# Patient Record
Sex: Male | Born: 1950 | Race: White | Hispanic: No | Marital: Married | Smoking: Former smoker
Health system: Southern US, Community
[De-identification: ages and names within clinical notes are randomized; demographics above are authoritative.]

## PROBLEM LIST (undated history)

## (undated) DIAGNOSIS — E785 Hyperlipidemia, unspecified: Secondary | ICD-10-CM

## (undated) DIAGNOSIS — T8859XA Other complications of anesthesia, initial encounter: Secondary | ICD-10-CM

## (undated) DIAGNOSIS — I10 Essential (primary) hypertension: Secondary | ICD-10-CM

## (undated) DIAGNOSIS — E079 Disorder of thyroid, unspecified: Secondary | ICD-10-CM

## (undated) DIAGNOSIS — Z8719 Personal history of other diseases of the digestive system: Secondary | ICD-10-CM

## (undated) DIAGNOSIS — E049 Nontoxic goiter, unspecified: Secondary | ICD-10-CM

## (undated) DIAGNOSIS — T4145XA Adverse effect of unspecified anesthetic, initial encounter: Secondary | ICD-10-CM

## (undated) DIAGNOSIS — K5790 Diverticulosis of intestine, part unspecified, without perforation or abscess without bleeding: Secondary | ICD-10-CM

## (undated) DIAGNOSIS — N200 Calculus of kidney: Secondary | ICD-10-CM

## (undated) DIAGNOSIS — I499 Cardiac arrhythmia, unspecified: Secondary | ICD-10-CM

## (undated) DIAGNOSIS — M1990 Unspecified osteoarthritis, unspecified site: Secondary | ICD-10-CM

## (undated) DIAGNOSIS — E119 Type 2 diabetes mellitus without complications: Secondary | ICD-10-CM

## (undated) DIAGNOSIS — M8430XA Stress fracture, unspecified site, initial encounter for fracture: Secondary | ICD-10-CM

## (undated) HISTORY — DX: Essential (primary) hypertension: I10

## (undated) HISTORY — PX: TONSILLECTOMY: SUR1361

## (undated) HISTORY — PX: CYSTOSCOPY WITH INSERTION OF UROLIFT: SHX6678

## (undated) HISTORY — DX: Type 2 diabetes mellitus without complications: E11.9

## (undated) HISTORY — PX: JOINT REPLACEMENT: SHX530

## (undated) HISTORY — DX: Hyperlipidemia, unspecified: E78.5

## (undated) HISTORY — PX: WISDOM TOOTH EXTRACTION: SHX21

## (undated) HISTORY — DX: Disorder of thyroid, unspecified: E07.9

## (undated) HISTORY — DX: Calculus of kidney: N20.0

## (undated) HISTORY — DX: Diverticulosis of intestine, part unspecified, without perforation or abscess without bleeding: K57.90

## (undated) HISTORY — PX: SKIN CANCER DESTRUCTION: SHX778

## (undated) HISTORY — DX: Stress fracture, unspecified site, initial encounter for fracture: M84.30XA

---

## 1998-11-18 ENCOUNTER — Encounter: Admission: RE | Admit: 1998-11-18 | Discharge: 1999-02-16 | Payer: Self-pay | Admitting: *Deleted

## 2001-10-13 ENCOUNTER — Ambulatory Visit (HOSPITAL_COMMUNITY): Admission: RE | Admit: 2001-10-13 | Discharge: 2001-10-13 | Payer: Self-pay | Admitting: Family Medicine

## 2001-10-13 ENCOUNTER — Encounter: Payer: Self-pay | Admitting: Family Medicine

## 2001-10-31 ENCOUNTER — Encounter: Admission: RE | Admit: 2001-10-31 | Discharge: 2001-12-16 | Payer: Self-pay | Admitting: Family Medicine

## 2004-09-28 HISTORY — PX: OTHER SURGICAL HISTORY: SHX169

## 2005-09-11 ENCOUNTER — Emergency Department (HOSPITAL_COMMUNITY): Admission: EM | Admit: 2005-09-11 | Discharge: 2005-09-12 | Payer: Self-pay | Admitting: Emergency Medicine

## 2005-09-13 ENCOUNTER — Ambulatory Visit (HOSPITAL_COMMUNITY): Admission: EM | Admit: 2005-09-13 | Discharge: 2005-09-13 | Payer: Self-pay | Admitting: Emergency Medicine

## 2005-09-13 ENCOUNTER — Ambulatory Visit: Payer: Self-pay | Admitting: Gastroenterology

## 2008-04-13 ENCOUNTER — Ambulatory Visit: Payer: Self-pay | Admitting: Cardiology

## 2008-04-16 ENCOUNTER — Ambulatory Visit: Payer: Self-pay

## 2008-04-16 ENCOUNTER — Encounter: Payer: Self-pay | Admitting: Cardiology

## 2008-05-02 ENCOUNTER — Encounter: Admission: RE | Admit: 2008-05-02 | Discharge: 2008-05-02 | Payer: Self-pay | Admitting: Family Medicine

## 2008-05-10 ENCOUNTER — Other Ambulatory Visit: Admission: RE | Admit: 2008-05-10 | Discharge: 2008-05-10 | Payer: Self-pay | Admitting: Diagnostic Radiology

## 2008-05-10 ENCOUNTER — Encounter (INDEPENDENT_AMBULATORY_CARE_PROVIDER_SITE_OTHER): Payer: Self-pay | Admitting: Diagnostic Radiology

## 2008-05-10 ENCOUNTER — Encounter: Admission: RE | Admit: 2008-05-10 | Discharge: 2008-05-10 | Payer: Self-pay | Admitting: Family Medicine

## 2009-03-06 ENCOUNTER — Encounter: Admission: RE | Admit: 2009-03-06 | Discharge: 2009-03-06 | Payer: Self-pay | Admitting: General Surgery

## 2009-08-20 ENCOUNTER — Encounter (INDEPENDENT_AMBULATORY_CARE_PROVIDER_SITE_OTHER): Payer: Self-pay | Admitting: *Deleted

## 2009-09-09 ENCOUNTER — Encounter (INDEPENDENT_AMBULATORY_CARE_PROVIDER_SITE_OTHER): Payer: Self-pay | Admitting: *Deleted

## 2009-09-10 ENCOUNTER — Encounter (INDEPENDENT_AMBULATORY_CARE_PROVIDER_SITE_OTHER): Payer: Self-pay | Admitting: *Deleted

## 2009-09-10 ENCOUNTER — Ambulatory Visit: Payer: Self-pay | Admitting: Gastroenterology

## 2009-09-12 ENCOUNTER — Telehealth: Payer: Self-pay | Admitting: Gastroenterology

## 2009-10-07 ENCOUNTER — Ambulatory Visit: Payer: Self-pay | Admitting: Gastroenterology

## 2010-05-05 ENCOUNTER — Encounter: Admission: RE | Admit: 2010-05-05 | Discharge: 2010-05-05 | Payer: Self-pay | Admitting: Family Medicine

## 2010-06-16 ENCOUNTER — Encounter: Admission: RE | Admit: 2010-06-16 | Discharge: 2010-06-16 | Payer: Self-pay | Admitting: Family Medicine

## 2010-10-28 NOTE — Procedures (Signed)
Summary: Colonoscopy  Patient: [name] Note: All result statuses are Final unless otherwise noted.  Tests: (1) Colonoscopy (COL)   COL Colonoscopy           DONE     [facility]     [address]     [place], [place]  [address]           COLONOSCOPY PROCEDURE REPORT           PATIENT:  Austin Graham, Austin Graham  MR#:  [id number]     BIRTHDATE:  21-Nov-1950, 58 yrs. old  GENDER:  male           ENDOSCOPIST:  Vania Rea. Jarold Motto, MD, [facility]     Referred by:           PROCEDURE DATE:  10/07/2009     PROCEDURE:  Colonoscopy, Diagnostic     ASA CLASS:  Class II     INDICATIONS:  family Hx of polyps           MEDICATIONS:   Fentanyl 75 mcg IV, Versed 7 mg           DESCRIPTION OF PROCEDURE:   After the risks benefits and     alternatives of the procedure were thoroughly explained, informed     consent was obtained.  Digital rectal exam was performed and     revealed no abnormalities.   The LB CF-[id number] endoscope     was introduced through the anus and advanced to the cecum, which     was identified by both the appendix and ileocecal valve, limited     by extreme patient discomfort, a redundant colon.    The quality     of the prep was excellent, using MoviPrep.  The instrument was     then slowly withdrawn as the colon was fully examined.     <<PROCEDUREIMAGES>>           FINDINGS:  Scattered diverticula were found.  No polyps or cancers     were seen.  This was otherwise a normal examination of the colon.     Retroflexed views in the rectum revealed no abnormalities.    The     scope was then withdrawn from the patient and the procedure     completed.           COMPLICATIONS:  None           ENDOSCOPIC IMPRESSION:     1) Diverticula, scattered     2) No polyps or cancers     3) Otherwise normal examination     RECOMMENDATIONS:     1) Continue current colorectal screening recommendations for     "routine risk" patients with a repeat colonoscopy in 10  years.     2) high fiber diet           REPEAT EXAM:  No           ______________________________     Vania Rea. Jarold Motto, MD, [facility]           CC:  Rudi Heap, MD           n.     Rosalie Doctor:   Vania Rea. Gunda Maqueda at 10/07/2009 08:58 AM           Hinchliffe, Maisie Fus, [id number]  Note: An exclamation mark (!) indicates a result that was not dispersed into the flowsheet. Document Creation Date: 10/07/2009 8:58 AM _______________________________________________________________________  (1) Order result status:  Final Collection or observation date-time: 10/07/2009 08:51 Requested date-time:  Receipt date-time:  Reported date-time:  Referring Physician:   Ordering Physician: Sheryn Bison [phone]) Specimen Source:  Source: Launa Grill Order Number: [id number] Lab site:   Appended Document: Colonoscopy    Clinical Lists Changes  Observations: Added new observation of COLONNXTDUE: 09/2019 (10/07/2009 12:13)      Appended Document: Colonoscopy     Procedures Next Due Date:    Colonoscopy: 09/2019

## 2010-12-14 LAB — GLUCOSE, CAPILLARY
Glucose-Capillary: 120 mg/dL — ABNORMAL HIGH (ref 70–99)
Glucose-Capillary: 136 mg/dL — ABNORMAL HIGH (ref 70–99)

## 2011-02-06 ENCOUNTER — Encounter (INDEPENDENT_AMBULATORY_CARE_PROVIDER_SITE_OTHER): Payer: Self-pay | Admitting: General Surgery

## 2011-02-10 NOTE — Assessment & Plan Note (Signed)
[facility]                            CARDIOLOGY OFFICE NOTE   Austin Graham, Austin Graham                     MRN:          [id number]  DATE:04/13/2008                            DOB:          Jun 10, 1951    Austin Graham is a pleasant 60 year old male who I am asked to evaluate  for chest pain.  The patient was seen in this office by Dr. Antoine Poche in  2001 secondary to palpitations.  At that time, he was treated  conservatively.  The patient states after the past 3 weeks, he has not  felt well.  He feels weak in his thighs and in his arms.  He also has  had some headaches.  He also feels fatigued.  He occasionally feels a  pain in his chest and left arm that is described as a jabbing.  It is  not pleuritic or positional nor is it related to food.  It is not  exertional.  There is no associated nausea, vomiting, shortness of  breath, or diaphoresis.  Note, he does not have exertional chest pain.  There is also no significant orthopnea, PND, or pedal edema.  However,  he does have mild dyspnea with more extreme activities.  Because of the  above, we were asked to further evaluate.  Also of note, Dr. Christell Constant  discontinued his Lipitor approximately 3 weeks ago, but this has not  helped.  His blood pressure also has been somewhat low apparently, and  his Benicar was recently decreased from 20 to 10 mg p.o. daily.  His  present medications include Niaspan 1 g p.o. daily, vitamin D3, Lovaza,  Actos 30 mg p.o. daily, Prilosec, and Benicar 10 mg p.o. daily.  He has  an allergy to CODEINE.  Note, he did take a recent course of prednisone  for pain in his foot.  He states that when he discontinued this, he felt  much worse.  His family history is positive for coronary artery disease  in his father who had a myocardial infarction and stent placed at age  70.   SOCIAL HISTORY:  The patient does not smoke.  He only occasionally  consumes alcohol.   PAST MEDICAL  HISTORY:  Diabetes mellitus, history of hypertension, and  hyperlipidemia.  He has a history of hiatal hernia and nephrolithiasis.  He has had previous surgery for nephrolithiasis.  There is a history of  allergies and asthma by his report.  There is also some arthritis.   REVIEW OF SYSTEMS:  He denies any fevers, chills, or productive cough.  There is no hemoptysis.  He has had some problems with headaches.  There  is no dysphagia, odynophagia, melena, or hematochezia.  There is no  dysuria or hematuria.  There is no rash or seizure activity.  There is  no orthopnea, PND, or pedal edema.  Remaining systems are negative other  than described in the HPI.   PHYSICAL EXAMINATION:  VITAL SIGNS:  Blood pressure of 140/80 and his  pulse is 80.  He weighs 211 pounds.  GENERAL:  He is well developed and well nourished in  no acute distress  at present.  SKIN:  Warm and dry.  He does not appear to be depressed.  There is no  peripheral clubbing.  BACK:  Normal.  HEENT:  Normal with normal eyelids.  NECK:  Supple with normal upstroke bilaterally, and there are no bruits  noted.  There is no jugular venosus distention and no thyromegaly noted.  CHEST:  Clear to auscultation with normal expansion.  CARDIOVASCULAR:  Regular rate and rhythm.  Normal S1 and S2.  There are  no murmurs, rubs, or gallops noted.  There is no change with Valsalva.  ABDOMEN:  Nontender, nondistended.  Positive bowel sounds.  No  hepatosplenomegaly.  No masses appreciated.  There is no abdominal  bruit.  EXTREMITIES:  He has 2+ femoral pulses bilaterally.  No bruits.  No  edema.  I can palpate no cords.  He has 2+ posterior tibial pulses  bilaterally.  NEUROLOGIC:  Grossly intact.   Note, he did have laboratories drawn recently on April 06, 2008, by Dr.  Christell Constant.  This included a Lyme titer, which was normal and RMSF IgG titer,  which was negative.  On March 28, 2008, his hemoglobin was normal.  CK on  March 28, 2008, was  normal at 92.  His renal function is normal.  The sed  rate was normal at 1.  Also of note, the patient had his TSH checked on  Feb 08, 2008, that was normal at 2.801.  He also had an  electrocardiogram performed on March 28, 2008, that showed normal sinus  rhythm with no ST changes.   DIAGNOSES:  1. Atypical chest pain - [name] chest pain is extremely      atypical for coronary artery disease.  However, he is concerned      about these symptoms and has multiple risk factors.  We will      schedule him to have a stress echocardiogram to more fully      evaluate.  If it shows no ischemia, then I do not think we need to      pursue further cardiac evaluation.  Note, his symptoms were      somewhat descriptive of statin intolerance.  However, they have not      improved off the Lipitor.  I have asked him to hold his Niaspan as      well.  However, the total CK of 82 could not support myositis.  If      his symptoms do not improve, then his statin and Niaspan could be      resumed in the future.  Also of note, the patient's blood pressure      has run low on the higher dose of Benicar.  It has recently been      decreased to 10 mg p.o. daily.  He can continue to track this, and      if it continues to run low, then this could be discontinued.      Certainly, a low blood pressure could be contributed to his      symptoms as well.  We will see him back on an as-needed basis,      pending the results of his stress echocardiogram.  2. Hypertension - as per above, his Benicar has been adjusted with      further adjustments based on followup pressures.  3. Diabetes mellitus - he will continue on his Actos.  4. Hyperlipidemia - as per atypical chest pain.  Madolyn Frieze Jens Som, MD, [facility]  Electronically Signed    BSC/MedQ  DD: 04/13/2008  DT: 04/14/2008  Job #: [id number]   cc:   Ernestina Penna, M.D.

## 2011-02-13 NOTE — Op Note (Signed)
NAME:  Austin Graham, Austin Graham NO.:  [id number]   MEDICAL RECORD NO.:  [id number]          PATIENT TYPE:  OBV   LOCATION:  0104                         FACILITY:  [facility]   PHYSICIAN:  Jamison Neighbor, M.D.  DATE OF BIRTH:  1951-09-12   DATE OF PROCEDURE:  09/13/2005  DATE OF DISCHARGE:                                 OPERATIVE REPORT   PREOPERATIVE DIAGNOSIS:  Distal right ureteral calculus.   POSTOPERATIVE DIAGNOSIS:  Distal right ureteral calculus.   PROCEDURE:  Cystoscopy, right retrograde pyelogram with interpretation,  right ureteroscopy with basket extraction, right double-J catheter  insertion.   SURGEON:  Jamison Neighbor, M.D.   ANESTHESIA:  General.   COMPLICATIONS:  None.   DRAINS:  A 6 French x26 cm double-J catheter.   BRIEF HISTORY:  This 60 year old male got right flank pain on Tuesday of  this past week.  The patient at first thought this was back pain due to  working in the yard, but then the pain began to become more consistent with  renal colic.  The patient has had stones in the past, although he has never  undergone formal evaluation.  The patient was seen by Dr. Brunilda Payor this week,  who placed him on Flomax and told him that the stone would probably pass.  According to the patient and his wife, the CT scan revealed four stones in  the right kidney, three in the left, and two in the ureter.  The patient  spoke to me on Friday night and was directed to the emergency room, but  after coming to the emergency room and being treated for constipation, he  actually felt better.  It appears that he has developed somewhat of an ileus  due to the patient medications.  He got worse today and was seen in the  emergency room by Dr. Claudette Head from GI, who felt that the ileus was  secondary to the distal stone.  The patient has elected to now have the  stone removed.  He was told that the plain film shows a clear-cut stone at  the distal right ureter, but the  other stones could not be well-visualized  due to the ileus.  If more than one stone could be seen in the ureter, they  would be extracted.  If there are stones that could not be removed and/or  fragmented with lithotripsy, a simple stent will be placed in preparation  for ESWL.  He was advised that we certainly would not know at this point  when and if we would treat any of the additional stones, and it would depend  on the size of the stones.  The patient was also advised that eventually he  should undergo stone evaluation and 24-hour urine study, and he is going to  consider that following discharge.  The patient has agreed to ureteroscopy,  retrograde studies, etc., with removal of the stone if possible.  Full  informed consent was obtained.   PROCEDURE:  After successful induction of general anesthesia, the patient  was placed in the dorsal lithotomy position, prepped with Betadine, and  draped in the usual sterile fashion.  Cystoscopy was performed, and the  urethra was visualized in its entirety and was found to be normal.  Beyond  the verumontanum, there was no particular prostatic enlargement.  The  bladder was carefully inspected.  It was free of any tumor or stones.  Both  ureteral orifices were normal in configuration and location.  A retrograde  study performed on the left-hand side showed some hydronephrosis down  towards the distal ureter.  The stone was not well visualized on the  retrograde.  The upper tracts otherwise appeared slightly dilated, but a  clear cut stone could not be seen in the more proximal ureter.  There was a  hint of a possible stone at or about the level of the UPJ.  The guidewire  was passed up to the kidney.  The ureteroscope was inserted along side the  guidewire, and the stone could be visualized, but the intramural tunnel was  tight, and the ureteroscope could not be advanced all the way to the stone.  It was removed, and the cystoscope was  back-loaded over the wire.  A 4 cm  balloon dilator was used to dilate the distal ureter.  The ureteroscope was  then reinserted, and the stone was easily visualized.  It was grasped under  direct vision with the basket, and the stone was removed.  The ureteroscope  was reinserted and advanced along the length of the kidney until the  ureteroscope had gone as far as it possibly could.  This was just below the  UPJ, and no stone could be seen anywhere in that portion of the ureter.  It  was not felt that there was justification for placement of a ureteral access  sheath for the use of a flexible ureteroscope to visualize the pelvis and  the more proximal ureter, as it certainly would not be treated with basket  extraction at this time.  The decision was made to place the double-J stent,  allow the patient's ileus to resolve, and reimage him with plain films to  determine if there was stone material that would require treatment.  If the  stones that have been seen on this CT are of substantial size, he should  have these treated with ESWL, and the stent could be left in place until  that has been completed.  If on the other hand, the stone fragments that are  identified are quite small, the stent will be removed in a week or so.  The  stent was placed over a guidewire and allowed to coil normally in the renal  pelvis as well as within the bladder.  The bladder was drained.  The patient  tolerated the procedure well and was taken to the recovery room in good  condition.   RETROGRADE PYELOGRAM:  A 6 French open-ended catheter was inserted into the  ureter, and the ureter was visualized.  The contrast did flow past what  appeared to be stone in the distal right ureter and filled a collecting  system that was otherwise normal.  There was not a clear-cut stone in the  proximal ureter, and the small stones in the CICP could not be well visualized.  Because the system was slightly dilated, and it  was clear that  there was delayed drain-out on the images.  This turned out to be consistent  with the stone in the distal right ureter.           ______________________________  Jamison Neighbor, M.D.  Electronically Signed     RJE/MEDQ  D:  09/13/2005  T:  09/14/2005  Job:  [id number]   cc:   Ernestina Penna, M.D.  Fax: [phone]   Venita Lick. Russella Dar, M.D. [facility]  [address]  [place]  [place] [address]

## 2011-05-20 ENCOUNTER — Encounter (INDEPENDENT_AMBULATORY_CARE_PROVIDER_SITE_OTHER): Payer: Self-pay | Admitting: General Surgery

## 2011-06-02 ENCOUNTER — Telehealth (INDEPENDENT_AMBULATORY_CARE_PROVIDER_SITE_OTHER): Payer: Self-pay | Admitting: General Surgery

## 2011-06-02 DIAGNOSIS — E042 Nontoxic multinodular goiter: Secondary | ICD-10-CM

## 2011-06-10 ENCOUNTER — Telehealth (INDEPENDENT_AMBULATORY_CARE_PROVIDER_SITE_OTHER): Payer: Self-pay | Admitting: General Surgery

## 2011-06-11 ENCOUNTER — Telehealth (INDEPENDENT_AMBULATORY_CARE_PROVIDER_SITE_OTHER): Payer: Self-pay

## 2011-06-11 NOTE — Telephone Encounter (Signed)
lmom that no labs are needed. We are only scheduling ultrasound. If pt requests labs he needs to call Dr Christell Constant for any labs.

## 2011-06-22 ENCOUNTER — Ambulatory Visit
Admission: RE | Admit: 2011-06-22 | Discharge: 2011-06-22 | Disposition: A | Discharge status: Home / Self Care | Payer: BC Managed Care – PPO | Source: Ambulatory Visit | Attending: General Surgery | Admitting: General Surgery

## 2011-06-22 DIAGNOSIS — E042 Nontoxic multinodular goiter: Secondary | ICD-10-CM

## 2011-07-09 ENCOUNTER — Telehealth (INDEPENDENT_AMBULATORY_CARE_PROVIDER_SITE_OTHER): Payer: Self-pay | Admitting: General Surgery

## 2011-07-09 ENCOUNTER — Encounter (INDEPENDENT_AMBULATORY_CARE_PROVIDER_SITE_OTHER): Payer: Self-pay | Admitting: General Surgery

## 2011-07-09 ENCOUNTER — Ambulatory Visit (INDEPENDENT_AMBULATORY_CARE_PROVIDER_SITE_OTHER): Payer: Self-pay | Admitting: General Surgery

## 2011-07-09 VITALS — BP 128/88 | HR 64 | Temp 96.8°F | Resp 20 | Ht 72.0 in | Wt 214.0 lb

## 2011-07-09 DIAGNOSIS — E042 Nontoxic multinodular goiter: Secondary | ICD-10-CM

## 2011-07-09 NOTE — Telephone Encounter (Signed)
Mr. Dolce had to leave the office today to return to work prior to my seeing him.  He asked  that I give him a call. I called him on his mobile phone and left a message regarding his thyroid ultrasound. He was encouraged to call me to discuss this further.

## 2011-07-12 NOTE — Progress Notes (Signed)
Subjective:     Patient ID: Austin Graham, male   DOB: 1951-07-05, 60 y.o.   MRN: [id number]  HPI The patient did not stay for his appointment. We will call the results of his ultrasound to him.  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   Plan:     followup with me PRN

## 2012-06-30 ENCOUNTER — Other Ambulatory Visit: Payer: Self-pay | Admitting: Family Medicine

## 2012-06-30 DIAGNOSIS — E041 Nontoxic single thyroid nodule: Secondary | ICD-10-CM

## 2012-07-05 ENCOUNTER — Ambulatory Visit
Admission: RE | Admit: 2012-07-05 | Discharge: 2012-07-05 | Disposition: A | Discharge status: Home / Self Care | Payer: BC Managed Care – PPO | Source: Ambulatory Visit | Attending: Family Medicine | Admitting: Family Medicine

## 2012-07-05 DIAGNOSIS — E041 Nontoxic single thyroid nodule: Secondary | ICD-10-CM

## 2012-11-02 ENCOUNTER — Encounter: Payer: Self-pay | Admitting: Cardiology

## 2012-11-02 ENCOUNTER — Ambulatory Visit (INDEPENDENT_AMBULATORY_CARE_PROVIDER_SITE_OTHER): Payer: BC Managed Care – PPO | Admitting: Cardiology

## 2012-11-02 VITALS — BP 140/85 | HR 96 | Ht 72.0 in | Wt 211.0 lb

## 2012-11-02 DIAGNOSIS — R0600 Dyspnea, unspecified: Secondary | ICD-10-CM

## 2012-11-02 DIAGNOSIS — R0609 Other forms of dyspnea: Secondary | ICD-10-CM

## 2012-11-02 DIAGNOSIS — E119 Type 2 diabetes mellitus without complications: Secondary | ICD-10-CM | POA: Insufficient documentation

## 2012-11-02 NOTE — Progress Notes (Signed)
HPI The patient presents for evaluation of dyspnea.  He was seen by Dr. Jens Som in 2009. He had some atypical chest discomfort at that time. Stress echocardiography at that time demonstrated no significant abnormalities.  He was actually able to exercise 10 minutes. The patient had recent followup with Dr. Christell Constant and was maintenance of this with exertion. This happened with significant exertion such as climbing more than a flight of stairs. He blames this on some weight gain and inactivity following a stress fracture last year. He has not been in level of activity. He's been taking a lot of Motrin. He's been noticing some chest discomfort. This does not occur with the dyspnea. It is sporadic. It actually seems to her slightly since he started Prevacid. He denies any neck discomfort though he sometimes gets some spasm of the muscle in his jaw. He's had rare palpitations in the past but no presyncope or syncope. He denies any PND or orthopnea.  Allergies  Allergen Reactions  . Codeine     REACTION: agitation  . Erythromycin     REACTION: diarrhea    Current Outpatient Prescriptions  Medication Sig Dispense Refill  . aspirin 81 MG tablet Take 81 mg by mouth daily.      Marland Kitchen atorvastatin (LIPITOR) 40 MG tablet Take 40 mg by mouth daily.        . calcium-vitamin D (OSCAL WITH D) 500-200 MG-UNIT per tablet Take 2 tablets by mouth daily.      . Celecoxib (CELEBREX PO) Take by mouth daily.      . Cholecalciferol (VITAMIN D3) 2000 UNITS capsule Take 2,000 Units by mouth daily.        . Cinnamon 500 MG capsule Take 500 mg by mouth daily.      . fish oil-omega-3 fatty acids 1000 MG capsule Take 2 g by mouth daily.      Marland Kitchen GLUCOSAMINE HCL PO Take by mouth daily.      . Multiple Vitamins-Minerals (CENTRUM SILVER ADULT 50+ PO) Take by mouth daily.      Marland Kitchen olmesartan (BENICAR) 20 MG tablet Take 20 mg by mouth daily. TAKE 20 MON WED AND FRI. TAKE 10 MG ON TUES, THURS, SAT AND SUN      . Omeprazole Magnesium  (PRILOSEC OTC PO) Take by mouth daily.        Past Medical History  Diagnosis Date  . Kidney stones   . Hypertension   . Asthma   . Hyperlipidemia   . Thyroid disease   . Diabetes     Past Surgical History  Procedure Date  . Kidney stones 2006    Family History  Problem Relation Age of Onset  . Cancer Sister   . Diabetes Sister   . Cancer Mother 24  . CAD Father 56    Died age 31 after CABG  . Stroke Father     History   Social History  . Marital Status: Married    Spouse Name: N/A    Number of Children: 3  . Years of Education: N/A   Occupational History  .     Social History Main Topics  . Smoking status: Former Smoker    Quit date: 11/02/1974  . Smokeless tobacco: Never Used  . Alcohol Use: 0.0 oz/week    1-2 drink(s) per week     Comment: 2-3 PER WEEK  . Drug Use: No  . Sexually Active: Not on file   Other Topics Concern  . Not on  file   Social History Narrative   Lives at home with wife.      ROS:   Positive for urinary frequency, palpitations difficulty swallowing, left knee pain.  .Otherwise as stated in the HPI and negative for all other systems.  .  PHYSICAL EXAM BP 140/85  Pulse 96  Ht 6' (1.829 m)  Wt 211 lb (95.709 kg)  BMI 28.62 kg/m2 GENERAL:  Well appearing HEENT:  Pupils equal round and reactive, fundi not visualized, oral mucosa unremarkable NECK:  No jugular venous distention, waveform within normal limits, carotid upstroke brisk and symmetric, no bruits, no thyromegaly LYMPHATICS:  No cervical, inguinal adenopathy LUNGS:  Clear to auscultation bilaterally BACK:  No CVA tenderness CHEST:  Unremarkable HEART:  PMI not displaced or sustained,S1 and S2 within normal limits, no S3, no S4, no clicks, no rubs, no murmurs ABD:  Flat, positive bowel sounds normal in frequency in pitch, no bruits, no rebound, no guarding, no midline pulsatile mass, no hepatomegaly, no splenomegaly EXT:  2 plus pulses throughout, no edema, no cyanosis  no clubbing SKIN:  No rashes no nodules NEURO:  Cranial nerves II through XII grossly intact, motor grossly intact throughout PSYCH:  Cognitively intact, oriented to person place and time   EKG:  Sinus rhythm, rate 97, axis within normal limits, intervals within normal limits, no acute ST-T wave changes.  11/02/2012   ASSESSMENT AND PLAN  Dyspnea - I think the pretest probability of obstructive coronary disease is low. However, given his risk factors screening for obstructive coronary disease is indicated. I will bring the patient back for a POET (Plain Old Exercise Test). This will allow me to screen for obstructive coronary disease, risk stratify and very importantly provide a prescription for exercise.  He will check with his orthopedist to see if he is able to do this with his recent stress fracture.  Overweight - The patient understands and exercise for weight loss and hopes to get back into exercise.  Diabetes - Unfortunately his hemoglobin A1c is 7.8. He has been reluctant to use metformin which was suggested by Dr. Christell Constant. I asked him to reconsider this if she's currently not able to control his diabetes with diet and exercise. He will discuss this further with Dr. more.

## 2012-11-02 NOTE — Patient Instructions (Addendum)
The current medical regimen is effective;  continue present plan and medications.  Your physician has requested that you have an exercise tolerance test. For further information please visit [email or web address]. Please also follow instruction sheet, as given.   

## 2012-12-02 ENCOUNTER — Encounter: Payer: BC Managed Care – PPO | Admitting: Cardiology

## 2012-12-12 ENCOUNTER — Other Ambulatory Visit: Payer: Self-pay | Admitting: *Deleted

## 2012-12-12 DIAGNOSIS — M899 Disorder of bone, unspecified: Secondary | ICD-10-CM

## 2012-12-19 ENCOUNTER — Ambulatory Visit (INDEPENDENT_AMBULATORY_CARE_PROVIDER_SITE_OTHER): Payer: BC Managed Care – PPO | Admitting: Nurse Practitioner

## 2012-12-19 ENCOUNTER — Other Ambulatory Visit: Payer: Self-pay | Admitting: Family Medicine

## 2012-12-19 ENCOUNTER — Encounter: Payer: Self-pay | Admitting: Cardiology

## 2012-12-19 DIAGNOSIS — R072 Precordial pain: Secondary | ICD-10-CM

## 2012-12-19 DIAGNOSIS — R0789 Other chest pain: Secondary | ICD-10-CM

## 2012-12-19 NOTE — Progress Notes (Signed)
Exercise Treadmill Test  Pre-Exercise Testing Evaluation Rhythm: normal sinus  Rate: 94     Test  Exercise Tolerance Test Ordering MD: Angelina Sheriff, MD  Interpreting MD: Nicolasa Ducking NP  Unique Test No: 1  Treadmill:  1  Indication for ETT: dyspnea  Contraindication to ETT: No   Stress Modality: exercise - treadmill  Cardiac Imaging Performed: non   Protocol: standard Bruce - maximal  Max BP:  204 55  Max MPHR (bpm):  160 85% MPR (bpm):  136  MPHR obtained (bpm):  166 % MPHR obtained:  105  Reached 85% MPHR (min:sec):  6:09 Total Exercise Time (min-sec): 10:50  Workload in METS:  10.2 Borg Scale:14  Reason ETT Terminated:  desired heart rate attained    ST Segment Analysis At Rest: normal ST segments - no evidence of significant ST depression With Exercise: no evidence of significant ST depression  Other Information Arrhythmia:  No Angina during ETT:  absent (0) Quality of ETT:  diagnostic  ETT Interpretation:  normal - no evidence of ischemia by ST analysis  Comments: Good exercise tolerance. No chest pain or significant doe.  Test ended due to HR achievement and thigh tightness.  He did have r hip pain r/t stress fx, though this did not limit exercise.  No acute st/t changes.  HR dropped from 166 to 136 within 1 minute of slowing to @ 2% grade.  Recommendations: Pt interested in exercise, though has been limited by right hip pain/stress fx.  He will resume walking 1/2 to 1 mile/day and incrementally increase his distance/time back to 2 miles/day as his hip pain allows.

## 2013-02-08 ENCOUNTER — Ambulatory Visit (INDEPENDENT_AMBULATORY_CARE_PROVIDER_SITE_OTHER): Payer: BC Managed Care – PPO

## 2013-02-08 ENCOUNTER — Ambulatory Visit: Payer: BC Managed Care – PPO

## 2013-02-08 DIAGNOSIS — M899 Disorder of bone, unspecified: Secondary | ICD-10-CM

## 2013-02-08 DIAGNOSIS — M949 Disorder of cartilage, unspecified: Secondary | ICD-10-CM

## 2013-02-27 ENCOUNTER — Ambulatory Visit (INDEPENDENT_AMBULATORY_CARE_PROVIDER_SITE_OTHER): Payer: BC Managed Care – PPO | Admitting: Family Medicine

## 2013-02-27 ENCOUNTER — Encounter: Payer: Self-pay | Admitting: Family Medicine

## 2013-02-27 VITALS — BP 119/72 | HR 79 | Temp 97.1°F | Ht 71.0 in | Wt 210.8 lb

## 2013-02-27 DIAGNOSIS — M169 Osteoarthritis of hip, unspecified: Secondary | ICD-10-CM

## 2013-02-27 DIAGNOSIS — R5383 Other fatigue: Secondary | ICD-10-CM

## 2013-02-27 DIAGNOSIS — M1611 Unilateral primary osteoarthritis, right hip: Secondary | ICD-10-CM

## 2013-02-27 DIAGNOSIS — E785 Hyperlipidemia, unspecified: Secondary | ICD-10-CM

## 2013-02-27 DIAGNOSIS — I10 Essential (primary) hypertension: Secondary | ICD-10-CM

## 2013-02-27 DIAGNOSIS — R739 Hyperglycemia, unspecified: Secondary | ICD-10-CM

## 2013-02-27 DIAGNOSIS — E559 Vitamin D deficiency, unspecified: Secondary | ICD-10-CM

## 2013-02-27 DIAGNOSIS — R7309 Other abnormal glucose: Secondary | ICD-10-CM

## 2013-02-27 LAB — BASIC METABOLIC PANEL WITH GFR
CO2: 27 mEq/L (ref 19–32)
Chloride: 103 mEq/L (ref 96–112)
Creat: 1.06 mg/dL (ref 0.50–1.35)
GFR, Est Non African American: 76 mL/min
Potassium: 5 mEq/L (ref 3.5–5.3)

## 2013-02-27 LAB — POCT CBC
Granulocyte percent: 59.8 % (ref 37–80)
HCT, POC: 41.3 % — AB (ref 43.5–53.7)
Hemoglobin: 15 g/dL (ref 14.1–18.1)
Lymph, poc: 2 (ref 0.6–3.4)
MCH, POC: 32.8 pg — AB (ref 27–31.2)
MCHC: 36.4 g/dL — AB (ref 31.8–35.4)
MCV: 90.3 fL (ref 80–97)
MPV: 7 fL (ref 0–99.8)
POC Granulocyte: 3.6 (ref 2–6.9)
POC LYMPH PERCENT: 34 % (ref 10–50)
Platelet Count, POC: 221 K/uL (ref 142–424)
RBC: 4.6 M/uL — AB (ref 4.69–6.13)
RDW, POC: 12.9 %
WBC: 6 K/uL (ref 4.6–10.2)

## 2013-02-27 LAB — HEPATIC FUNCTION PANEL
AST: 21 U/L (ref 0–37)
Alkaline Phosphatase: 85 U/L (ref 39–117)
Indirect Bilirubin: 0.8 mg/dL (ref 0.0–0.9)
Total Bilirubin: 1 mg/dL (ref 0.3–1.2)

## 2013-02-27 LAB — POCT GLYCOSYLATED HEMOGLOBIN (HGB A1C): Hemoglobin A1C: 7.3

## 2013-02-27 NOTE — Progress Notes (Signed)
  Subjective:    Patient ID: Austin Graham, male    DOB: [birth year], 62 y.o.   MRN: [id number]  HPI Patient seeing orthopedist regarding right hip pain and left knee pain. Pain in both areas is worse after sitting or laying for prolonged period of time. Fasting blood sugars usually run 1:30 to 150 and 2 hour after eating blood sugars may be less than 108. Patient is exercising mostly with walking on a flat surface. Patient doesn't like taking antihistamines. As of note the patient had a stress test  recently which was normal.   Review of Systems  Constitutional: Positive for fatigue (possibly due to meds).  HENT: Positive for sneezing and postnasal drip. Negative for ear pain and sore throat.   Eyes: Positive for itching.  Respiratory: Positive for wheezing (due to allergies). Negative for cough and shortness of breath.   Cardiovascular: Negative.   Gastrointestinal: Negative.   Endocrine: Negative.   Genitourinary: Positive for frequency (at night). Difficulty urinating: due to allergies.  Musculoskeletal: Positive for back pain (LBP, due to DDD) and arthralgias (L knee ,R hip).  Allergic/Immunologic: Positive for environmental allergies (seasonal).  Neurological: Negative.   Psychiatric/Behavioral: Positive for sleep disturbance (due to urinary frequency).       Objective:   Physical Exam BP 119/72  Pulse 79  Temp(Src) 97.1 F (36.2 C) (Oral)  Ht 5\' 11"  (1.803 m)  Wt 210 lb 12.8 oz (95.618 kg)  BMI 29.41 kg/m2  The patient appeared well nourished and normally developed, alert and oriented to time and place. Speech, behavior and judgement appear normal. Vital signs as documented.  Head exam is unremarkable. No scleral icterus or pallor noted. There was some nasal pallor bilaterally. Mouth and throat were normal. Neck is without jugular venous distension, thyromegally, or carotid bruits. Carotid upstrokes are brisk bilaterally. No cervical adenopathy. Lungs are clear anteriorly  and posteriorly to auscultation. Normal respiratory effort. Cardiac exam reveals regular rate and rhythm at 72 per minute. First and second heart sounds normal.  No murmurs, rubs or gallops.  Abdominal exam reveals normal bowl sounds, no masses, no organomegaly and no aortic enlargement. No inguinal adenopathy. No tenderness. Extremities are nonedematous and both femoral and pedal pulses are normal. Skin without pallor or jaundice.  Warm and dry, without rash. Neurologic exam reveals normal deep tendon reflexes and normal sensation. Diabetic foot exam was done today.          Assessment & Plan:  1. Hypertension - BASIC METABOLIC PANEL WITH GFR; Standing  2. Hyperlipemia - NMR Lipoprofile with Lipids; Standing - Hepatic function panel; Standing  3. Hyperglycemia - POCT glycosylated hemoglobin (Hb A1C); Standing  4. Vitamin D deficiency - Vitamin D 25 hydroxy; Standing  5. Fatigue - POCT CBC; Standing  6. Osteoarthritis of right hip Continue followup with orthopedist  And walk as much as possible.   Patient Instructions  Take medications as directed. Get as much exercise as possible and as much as tolerated. Try Nasacort AQ 1-2 sprays each nostril once daily for allergic rhinitis. This is over-the-counter now. On return to clinic at next visit he will need a rectal exam and a PSA Return FOBT

## 2013-02-27 NOTE — Patient Instructions (Addendum)
Take medications as directed. Get as much exercise as possible and as much as tolerated. Try Nasacort AQ 1-2 sprays each nostril once daily for allergic rhinitis. This is over-the-counter now. On return to clinic at next visit he will need a rectal exam and a PSA Return FOBT

## 2013-02-27 NOTE — Addendum Note (Signed)
Addended by: Orma Render F on: 02/27/2013 10:03 AM   Modules accepted: Orders

## 2013-03-01 LAB — NMR LIPOPROFILE WITH LIPIDS
Cholesterol, Total: 133 mg/dL (ref <200)
HDL Particle Number: 26.2 umol/L — ABNORMAL LOW (ref >=30.5)
LDL Particle Number: 1424 nmol/L — ABNORMAL HIGH (ref <1000)
LP-IR Score: 76 — ABNORMAL HIGH (ref <=45)
Large VLDL-P: 3.7 nmol/L — ABNORMAL HIGH (ref <=2.7)
Small LDL Particle Number: 985 nmol/L — ABNORMAL HIGH (ref <=527)
Triglycerides: 120 mg/dL (ref <150)
VLDL Size: 52.6 nm — ABNORMAL HIGH (ref <=46.6)

## 2013-03-06 ENCOUNTER — Telehealth: Payer: Self-pay | Admitting: *Deleted

## 2013-03-06 NOTE — Telephone Encounter (Signed)
Message copied by Bearl Mulberry on Mon Mar 06, 2013  6:01 PM ------      Message from: Ernestina Penna      Created: Wed Mar 01, 2013  8:10 AM       Vitamin D level is excellent      Total LDL particle number is elevated at 1424, triglycerides are slightly elevated at 120,goal would be less than 100. LDL size is small, HDL P. is low patient must do better with therapeutic lifestyle changes and getting this cholesterol under better control.----- add zetia 10 one half daily,reduce Lipitor to 40 mg daily, recheck LFTs in 4 week++++++++++++      Renal and electrolytes were good. Blood sugar is elevated at 151      Liver function tests within normal limit.       ------

## 2013-03-06 NOTE — Telephone Encounter (Signed)
Pt notified of results  Zetia samples at front for pt pick up

## 2013-07-03 ENCOUNTER — Other Ambulatory Visit: Payer: Self-pay | Admitting: Family Medicine

## 2013-07-11 ENCOUNTER — Other Ambulatory Visit: Payer: Self-pay | Admitting: Family Medicine

## 2013-07-27 ENCOUNTER — Ambulatory Visit: Payer: BC Managed Care – PPO | Admitting: Family Medicine

## 2013-11-21 ENCOUNTER — Telehealth: Payer: Self-pay | Admitting: Family Medicine

## 2013-11-21 DIAGNOSIS — Z0000 Encounter for general adult medical examination without abnormal findings: Secondary | ICD-10-CM

## 2013-11-21 DIAGNOSIS — E119 Type 2 diabetes mellitus without complications: Secondary | ICD-10-CM

## 2013-11-21 NOTE — Telephone Encounter (Signed)
Pt aware that orders will be placed for labs

## 2013-11-22 ENCOUNTER — Other Ambulatory Visit (INDEPENDENT_AMBULATORY_CARE_PROVIDER_SITE_OTHER): Payer: BC Managed Care – PPO

## 2013-11-22 DIAGNOSIS — R739 Hyperglycemia, unspecified: Secondary | ICD-10-CM

## 2013-11-22 DIAGNOSIS — R7309 Other abnormal glucose: Secondary | ICD-10-CM

## 2013-11-22 DIAGNOSIS — E785 Hyperlipidemia, unspecified: Secondary | ICD-10-CM

## 2013-11-22 DIAGNOSIS — R5383 Other fatigue: Secondary | ICD-10-CM

## 2013-11-22 DIAGNOSIS — R5381 Other malaise: Secondary | ICD-10-CM

## 2013-11-22 DIAGNOSIS — I10 Essential (primary) hypertension: Secondary | ICD-10-CM

## 2013-11-22 DIAGNOSIS — E559 Vitamin D deficiency, unspecified: Secondary | ICD-10-CM

## 2013-11-22 LAB — HEPATIC FUNCTION PANEL
ALK PHOS: 87 U/L (ref 39–117)
ALT: 34 U/L (ref 0–53)
AST: 21 U/L (ref 0–37)
Albumin: 4.6 g/dL (ref 3.5–5.2)
BILIRUBIN DIRECT: 0.2 mg/dL (ref 0.0–0.3)
BILIRUBIN INDIRECT: 0.9 mg/dL (ref 0.2–1.2)
BILIRUBIN TOTAL: 1.1 mg/dL (ref 0.2–1.2)
Total Protein: 6.6 g/dL (ref 6.0–8.3)

## 2013-11-22 LAB — POCT CBC
Granulocyte percent: 57.5 %G (ref 37–80)
HEMATOCRIT: 42.9 % — AB (ref 43.5–53.7)
Hemoglobin: 14.1 g/dL (ref 14.1–18.1)
Lymph, poc: 2 (ref 0.6–3.4)
MCH, POC: 30.7 pg (ref 27–31.2)
MCHC: 32.9 g/dL (ref 31.8–35.4)
MCV: 93.3 fL (ref 80–97)
MPV: 7.2 fL (ref 0–99.8)
POC GRANULOCYTE: 3.3 (ref 2–6.9)
POC LYMPH %: 35.8 % (ref 10–50)
Platelet Count, POC: 233 10*3/uL (ref 142–424)
RBC: 4.6 M/uL — AB (ref 4.69–6.13)
RDW, POC: 13.1 %
WBC: 5.7 10*3/uL (ref 4.6–10.2)

## 2013-11-22 LAB — BASIC METABOLIC PANEL WITH GFR
BUN: 18 mg/dL (ref 6–23)
CHLORIDE: 104 meq/L (ref 96–112)
CO2: 23 meq/L (ref 19–32)
Calcium: 9.6 mg/dL (ref 8.4–10.5)
Creat: 0.92 mg/dL (ref 0.50–1.35)
GFR, Est African American: >89 mL/min
GFR, Est Non African American: 89 mL/min
Glucose, Bld: 172 mg/dL — ABNORMAL HIGH (ref 70–99)
Potassium: 4.4 mEq/L (ref 3.5–5.3)
SODIUM: 139 meq/L (ref 135–145)

## 2013-11-22 LAB — POCT GLYCOSYLATED HEMOGLOBIN (HGB A1C): HEMOGLOBIN A1C: 8.1

## 2013-11-22 NOTE — Progress Notes (Signed)
Pt came in for labs only 

## 2013-11-23 LAB — VITAMIN D 25 HYDROXY (VIT D DEFICIENCY, FRACTURES): Vit D, 25-Hydroxy: 57 ng/mL (ref 30–89)

## 2013-11-24 LAB — NMR LIPOPROFILE WITH LIPIDS
Cholesterol, Total: 137 mg/dL (ref <200)
HDL PARTICLE NUMBER: 24.6 umol/L — AB (ref >=30.5)
HDL Size: 8 nm — ABNORMAL LOW (ref >=9.2)
HDL-C: 33 mg/dL — AB (ref >=40)
LDL (calc): 66 mg/dL (ref <100)
LDL Particle Number: 1481 nmol/L — ABNORMAL HIGH (ref <1000)
LDL SIZE: 20 nm — AB (ref >20.5)
LP-IR SCORE: 77 — AB (ref <=45)
Large VLDL-P: 5 nmol/L — ABNORMAL HIGH (ref <=2.7)
Small LDL Particle Number: 1020 nmol/L — ABNORMAL HIGH (ref <=527)
Triglycerides: 192 mg/dL — ABNORMAL HIGH (ref <150)
VLDL Size: 48.9 nm — ABNORMAL HIGH (ref <=46.6)

## 2013-11-30 ENCOUNTER — Encounter: Payer: Self-pay | Admitting: Family Medicine

## 2013-11-30 ENCOUNTER — Ambulatory Visit (INDEPENDENT_AMBULATORY_CARE_PROVIDER_SITE_OTHER): Payer: BC Managed Care – PPO | Admitting: Family Medicine

## 2013-11-30 VITALS — BP 117/72 | HR 87 | Temp 97.6°F | Ht 71.0 in | Wt 210.0 lb

## 2013-11-30 DIAGNOSIS — E785 Hyperlipidemia, unspecified: Secondary | ICD-10-CM | POA: Insufficient documentation

## 2013-11-30 DIAGNOSIS — M5137 Other intervertebral disc degeneration, lumbosacral region: Secondary | ICD-10-CM

## 2013-11-30 DIAGNOSIS — N200 Calculus of kidney: Secondary | ICD-10-CM | POA: Insufficient documentation

## 2013-11-30 DIAGNOSIS — N400 Enlarged prostate without lower urinary tract symptoms: Secondary | ICD-10-CM

## 2013-11-30 DIAGNOSIS — M25559 Pain in unspecified hip: Secondary | ICD-10-CM

## 2013-11-30 DIAGNOSIS — M5136 Other intervertebral disc degeneration, lumbar region: Secondary | ICD-10-CM

## 2013-11-30 DIAGNOSIS — I152 Hypertension secondary to endocrine disorders: Secondary | ICD-10-CM | POA: Insufficient documentation

## 2013-11-30 DIAGNOSIS — E042 Nontoxic multinodular goiter: Secondary | ICD-10-CM

## 2013-11-30 DIAGNOSIS — E1169 Type 2 diabetes mellitus with other specified complication: Secondary | ICD-10-CM | POA: Insufficient documentation

## 2013-11-30 DIAGNOSIS — N402 Nodular prostate without lower urinary tract symptoms: Secondary | ICD-10-CM

## 2013-11-30 DIAGNOSIS — K449 Diaphragmatic hernia without obstruction or gangrene: Secondary | ICD-10-CM

## 2013-11-30 DIAGNOSIS — K219 Gastro-esophageal reflux disease without esophagitis: Secondary | ICD-10-CM

## 2013-11-30 DIAGNOSIS — I10 Essential (primary) hypertension: Secondary | ICD-10-CM

## 2013-11-30 DIAGNOSIS — E119 Type 2 diabetes mellitus without complications: Secondary | ICD-10-CM

## 2013-11-30 LAB — POCT UA - MICROSCOPIC ONLY
Bacteria, U Microscopic: NEGATIVE
CRYSTALS, UR, HPF, POC: NEGATIVE
Casts, Ur, LPF, POC: NEGATIVE
Mucus, UA: NEGATIVE
RBC, URINE, MICROSCOPIC: NEGATIVE
WBC, UR, HPF, POC: NEGATIVE
Yeast, UA: NEGATIVE

## 2013-11-30 LAB — POCT URINALYSIS DIPSTICK
BILIRUBIN UA: NEGATIVE
Blood, UA: NEGATIVE
Glucose, UA: 250
Ketones, UA: NEGATIVE
LEUKOCYTES UA: NEGATIVE
Nitrite, UA: NEGATIVE
Protein, UA: NEGATIVE
SPEC GRAV UA: 1.02
Urobilinogen, UA: NEGATIVE
pH, UA: 5

## 2013-11-30 NOTE — Progress Notes (Signed)
Subjective:    Patient ID: Austin Graham, male    DOB: 06-20-1951, 63 y.o.   MRN: [id number]  HPI Patient returns for 6 month follow up on chronic medical conditions of diabetes, hyperlipidemia, and hypertension. He complains of fatigue increased abdominal discomfort nocturia and sleep issues. He continues to have problems with his left knee and right hip following a history of a stress fracture.   Review of Systems  Constitutional: Positive for fatigue.  HENT: Negative.   Eyes: Negative.   Respiratory: Negative.   Cardiovascular: Negative.   Gastrointestinal: Positive for abdominal pain (hiatal hernia) and constipation (mild constipation). Negative for nausea and blood in stool.  Endocrine: Negative.        Nocturia   Genitourinary: Negative.   Musculoskeletal: Positive for arthralgias (left knee and right hip pain s/p stress fracture).  Skin: Negative.   Allergic/Immunologic: Negative.   Neurological: Negative.   Hematological: Negative.   Psychiatric/Behavioral: Positive for sleep disturbance (nocturia).       Mildly depressed due to diabetes       Objective:   Physical Exam  Nursing note and vitals reviewed. Constitutional: He is oriented to person, place, and time. He appears well-developed and well-nourished.  Pleasant and somewhat anxious.  HENT:  Head: Normocephalic and atraumatic.  Right Ear: External ear normal.  Left Ear: External ear normal.  Nose: Nose normal.  Mouth/Throat: Oropharynx is clear and moist. No oropharyngeal exudate.  Eyes: Conjunctivae and EOM are normal. Pupils are equal, round, and reactive to light. Right eye exhibits no discharge. Left eye exhibits no discharge. No scleral icterus.  Neck: Normal range of motion. Neck supple. No thyromegaly present.  No carotid bruits  Cardiovascular: Normal rate, regular rhythm, normal heart sounds and intact distal pulses.  Exam reveals no gallop and no friction rub.   No murmur heard. At 72 per  minute  Pulmonary/Chest: Effort normal and breath sounds normal. No respiratory distress. He has no wheezes. He has no rales.  No axillary adenopathy or chest wall masses  Abdominal: Soft. Bowel sounds are normal. He exhibits no mass. There is no tenderness. There is no rebound and no guarding.  Genitourinary: Rectum normal and penis normal.  The prostate is enlarged. There is a firm area in the right side of the prostate at in the upper outer quadrant. There is also a Cipro testicular fullness on the right side. There is no inguinal hernia. There are no inguinal nodes. There are no rectal masses.  Musculoskeletal: Normal range of motion. He exhibits no edema and no tenderness.  Lymphadenopathy:    He has no cervical adenopathy.  Neurological: He is alert and oriented to person, place, and time. He has normal reflexes. No cranial nerve deficit.  Skin: Skin is warm and dry. No rash noted. No erythema. No pallor.  Psychiatric: He has a normal mood and affect. His behavior is normal. Judgment and thought content normal.   BP 117/72  Pulse 87  Temp(Src) 97.6 F (36.4 C) (Oral)  Ht 5\' 11"  (1.803 m)  Wt 210 lb (95.255 kg)  BMI 29.30 kg/m2        Assessment & Plan:  1. Hypertension  2. Hyperlipidemia  3. Multinodular goiter  4. Nephrolithiasis - Ambulatory referral to Orthopedic Surgery  5. BPH (benign prostatic hyperplasia)  6. Degenerative disc disease, lumbar  7. Diabetes -Appointment with clinical pharmacy  8. Prostate nodule - Ambulatory referral to Urology - PSA, total and free - POCT UA - Microscopic Only -  POCT urinalysis dipstick  9. Hip pain - Ambulatory referral to Orthopedic Surgery  10. Gastroesophageal reflux disease with hiatal hernia -Continue with current treatment  Patient Instructions        Continue current medications. Continue good therapeutic lifestyle changes which include good diet and exercise. Fall precautions discussed with  patient. If an FOBT was given today- please return it to our front desk. If you are over 43 years old - you may need Prevnar 16 or the adult Pneumonia vaccine. Check with your insurance company about coverage of this vaccine.  We will make an appointment for you to followup with the clinical pharmacist for better diabetes management, which may include insulin  We will Arrange an appointment with you with the orthopedist, Dr. Maureen Ralphs regarding your right hip We will also arrange an appointment with Dr. Amalia Hailey, the urologist at [facility] the prostate lump and the right supra-testicular fullness Please return the FOBT We will you  with results of the urinalysis and a PSA once those results are available Please schedule yourself for an eye exam Also please keep the appointment with the clinical pharmacist to get your blood sugar under better control and bring blood sugar readings with you to that visit    Arrie Senate MD

## 2013-11-30 NOTE — Patient Instructions (Addendum)
       Continue current medications. Continue good therapeutic lifestyle changes which include good diet and exercise. Fall precautions discussed with patient. If an FOBT was given today- please return it to our front desk. If you are over 61 years old - you may need Prevnar 73 or the adult Pneumonia vaccine. Check with your insurance company about coverage of this vaccine.  We will make an appointment for you to followup with the clinical pharmacist for better diabetes management, which may include insulin  We will Arrange an appointment with you with the orthopedist, Dr. Maureen Ralphs regarding your right hip We will also arrange an appointment with Dr. Amalia Hailey, the urologist at [place] the prostate lump and the right supra-testicular fullness Please return the FOBT We will you  with results of the urinalysis and a PSA once those results are available Please schedule yourself for an eye exam Also please keep the appointment with the clinical pharmacist to get your blood sugar under better control and bring blood sugar readings with you to that visit

## 2013-12-01 LAB — PSA, TOTAL AND FREE
PSA FREE: 0.11 ng/mL
PSA, Free Pct: 27.5 %
PSA: 0.4 ng/mL (ref 0.0–4.0)

## 2013-12-07 ENCOUNTER — Other Ambulatory Visit: Payer: Self-pay | Admitting: Family Medicine

## 2013-12-11 ENCOUNTER — Ambulatory Visit (INDEPENDENT_AMBULATORY_CARE_PROVIDER_SITE_OTHER): Payer: BC Managed Care – PPO | Admitting: Pharmacist

## 2013-12-11 ENCOUNTER — Encounter: Payer: Self-pay | Admitting: Pharmacist

## 2013-12-11 VITALS — BP 128/76 | HR 77 | Ht 71.0 in | Wt 211.0 lb

## 2013-12-11 DIAGNOSIS — E119 Type 2 diabetes mellitus without complications: Secondary | ICD-10-CM

## 2013-12-11 MED ORDER — BLOOD GLUCOSE TEST VI STRP: ORAL_STRIP | Status: DC

## 2013-12-11 MED ORDER — METFORMIN HCL ER 500 MG PO TB24: ORAL_TABLET | ORAL | Status: DC

## 2013-12-11 NOTE — Progress Notes (Signed)
Diabetes Flow Sheet:  Visit 1  Chief Complaint:   Chief Complaint  Patient presents with  . Diabetes    HPI:  Patient with history of pre diabetes which is not type 2 DM.  I have discussed taking metformin with him in the past but he has declined.   A1c was 8.1% (11/22/13) when DM diagnosed.  Only family member with DM is sister  Exam Polyuria:  negative  Polydipsia:  positive Polyphagia:  negative  BMI:  Body mass index is 29.44 kg/(m^2).   Weight changes:  stable General Appearance:  alert, oriented, no acute distress and well nourished Mood/Affect:  normal  Low fat/carbohydrate diet?  Yes Nicotine Abuse?  No Medication Compliance?  Yes Exercise?  Yes Alcohol Abuse?  No  Lab Results  Component Value Date   HGBA1C 8.1 11/22/2013    Lab Results  Component Value Date   LDLCALC 66 11/22/2013   TRIG 192* 11/22/2013     Medication Checklist: ACE Inhibitor/ARB?  Yes Lipid Lowering Agent?  Yes Aspirin?  Yes Oral Hypoglycemic Agent(s)?  No  Assessment: 1.  type 2 Diabetes.  Newly diagnosed but h/o pre diabetes 2.  Blood Pressure Control.  controlled 3.  Lipid Control.  ldl at goal but Tg elevated   Recommendations: 1.  1500 calorie, carbohydrate counting diet.  Patient is counseled extensively on carbohydrate counting, serving sizes, saturated fat intake and meal planning.  Patient is instructed to eat 3 meals a day and 3 small snacks.  Patient will supplement snacks based on physical activity. 2.  30 minutes of physical activity.  Patient is counseled to always carry glucose tablets, lifesavers, hard candies, etc., while exercising in case of hypoglycemic event. 3.  Patient is counseled on pathophysiology of diabetes and the risk of long-term complications.  Fasting blood glucose goals are 80-120mg /dL.  Post-prandial goals are < 180.  A1C goals < 7.0%. 4.  LDL goal of < 100, HDL > 40 and TG < 150; BP goal < 130/80 5.  Patient is counseled on proper use of glucometer and  lancing device.  Patient is informed to check up to bid and how to respond to unsuitable results. 6.  Medication recommendations at this time are as follows:  Start metformin XR 500mg  1 tablet daily for 7 days, then increase to 2 tablets daily.  Time spent counseling patient:  45 minutes   Referring provider:  Redge Gainer  PharmD:  Cherre Robins, [facility]

## 2013-12-25 DIAGNOSIS — N4289 Other specified disorders of prostate: Secondary | ICD-10-CM | POA: Insufficient documentation

## 2014-01-04 ENCOUNTER — Other Ambulatory Visit: Payer: Self-pay | Admitting: Family Medicine

## 2014-02-26 ENCOUNTER — Other Ambulatory Visit: Payer: Self-pay | Admitting: Family Medicine

## 2014-02-28 ENCOUNTER — Other Ambulatory Visit (INDEPENDENT_AMBULATORY_CARE_PROVIDER_SITE_OTHER): Payer: BC Managed Care – PPO

## 2014-02-28 DIAGNOSIS — E119 Type 2 diabetes mellitus without complications: Secondary | ICD-10-CM

## 2014-02-28 DIAGNOSIS — E559 Vitamin D deficiency, unspecified: Secondary | ICD-10-CM

## 2014-02-28 DIAGNOSIS — R5383 Other fatigue: Principal | ICD-10-CM

## 2014-02-28 DIAGNOSIS — I10 Essential (primary) hypertension: Secondary | ICD-10-CM

## 2014-02-28 DIAGNOSIS — E785 Hyperlipidemia, unspecified: Secondary | ICD-10-CM

## 2014-02-28 DIAGNOSIS — R5381 Other malaise: Secondary | ICD-10-CM

## 2014-02-28 LAB — POCT CBC
GRANULOCYTE PERCENT: 63.8 % (ref 37–80)
HCT, POC: 41.9 % — AB (ref 43.5–53.7)
Hemoglobin: 14.2 g/dL (ref 14.1–18.1)
Lymph, poc: 1.9 (ref 0.6–3.4)
MCH, POC: 31 pg (ref 27–31.2)
MCHC: 33.9 g/dL (ref 31.8–35.4)
MCV: 91.4 fL (ref 80–97)
MPV: 7.6 fL (ref 0–99.8)
PLATELET COUNT, POC: 236 10*3/uL (ref 142–424)
POC GRANULOCYTE: 3.9 (ref 2–6.9)
POC LYMPH %: 30.8 % (ref 10–50)
RBC: 4.6 M/uL — AB (ref 4.69–6.13)
RDW, POC: 13.7 %
WBC: 6.1 10*3/uL (ref 4.6–10.2)

## 2014-02-28 LAB — POCT GLYCOSYLATED HEMOGLOBIN (HGB A1C): HEMOGLOBIN A1C: 7.6

## 2014-02-28 NOTE — Progress Notes (Signed)
Patient came in for labs only.

## 2014-03-01 LAB — NMR, LIPOPROFILE
CHOLESTEROL: 150 mg/dL (ref 100–199)
HDL Cholesterol by NMR: 37 mg/dL — ABNORMAL LOW (ref >39)
HDL PARTICLE NUMBER: 28.7 umol/L — AB (ref >=30.5)
LDL PARTICLE NUMBER: 1300 nmol/L — AB (ref <1000)
LDL Size: 20.3 nm (ref >20.5)
LDLC SERPL CALC-MCNC: 83 mg/dL (ref 0–99)
LP-IR Score: 76 — ABNORMAL HIGH (ref <=45)
Small LDL Particle Number: 811 nmol/L — ABNORMAL HIGH (ref <=527)
TRIGLYCERIDES BY NMR: 152 mg/dL — AB (ref 0–149)

## 2014-03-01 LAB — HEPATIC FUNCTION PANEL
ALBUMIN: 4.9 g/dL — AB (ref 3.6–4.8)
ALT: 24 IU/L (ref 0–44)
AST: 20 IU/L (ref 0–40)
Alkaline Phosphatase: 87 IU/L (ref 39–117)
Bilirubin, Direct: 0.24 mg/dL (ref 0.00–0.40)
TOTAL PROTEIN: 6.6 g/dL (ref 6.0–8.5)
Total Bilirubin: 1.1 mg/dL (ref 0.0–1.2)

## 2014-03-01 LAB — BMP8+EGFR
BUN/Creatinine Ratio: 16 (ref 10–22)
BUN: 16 mg/dL (ref 8–27)
CALCIUM: 9.8 mg/dL (ref 8.6–10.2)
CO2: 24 mmol/L (ref 18–29)
Chloride: 101 mmol/L (ref 97–108)
Creatinine, Ser: 1.03 mg/dL (ref 0.76–1.27)
GFR calc non Af Amer: 78 mL/min/{1.73_m2} (ref >59)
GFR, EST AFRICAN AMERICAN: 90 mL/min/{1.73_m2} (ref >59)
GLUCOSE: 144 mg/dL — AB (ref 65–99)
POTASSIUM: 5 mmol/L (ref 3.5–5.2)
Sodium: 140 mmol/L (ref 134–144)

## 2014-03-01 LAB — VITAMIN D 25 HYDROXY (VIT D DEFICIENCY, FRACTURES): Vit D, 25-Hydroxy: 49 ng/mL (ref 30.0–100.0)

## 2014-03-05 ENCOUNTER — Telehealth: Payer: Self-pay | Admitting: Family Medicine

## 2014-03-05 NOTE — Telephone Encounter (Signed)
Message copied by Waverly Ferrari on Mon Mar 05, 2014  9:15 AM ------      Message from: Chipper Herb      Created: Sat Mar 03, 2014  7:24 AM       Please increase metformin to 1000 and the morning and 500 at night      Also increase atorvastatin 80 mg daily      Recheck liver function tests in 4 weeks-----continued as aggressive therapeutic lifestyle changes as possible ------

## 2014-03-05 NOTE — Progress Notes (Signed)
This patient is already aware of these results

## 2014-03-07 ENCOUNTER — Ambulatory Visit (INDEPENDENT_AMBULATORY_CARE_PROVIDER_SITE_OTHER): Payer: BC Managed Care – PPO | Admitting: Family Medicine

## 2014-03-07 ENCOUNTER — Ambulatory Visit (INDEPENDENT_AMBULATORY_CARE_PROVIDER_SITE_OTHER): Payer: BC Managed Care – PPO

## 2014-03-07 ENCOUNTER — Encounter: Payer: Self-pay | Admitting: Family Medicine

## 2014-03-07 VITALS — BP 128/78 | HR 91 | Temp 98.7°F | Ht 71.0 in | Wt 208.0 lb

## 2014-03-07 DIAGNOSIS — M2550 Pain in unspecified joint: Secondary | ICD-10-CM

## 2014-03-07 DIAGNOSIS — I10 Essential (primary) hypertension: Secondary | ICD-10-CM

## 2014-03-07 DIAGNOSIS — E119 Type 2 diabetes mellitus without complications: Secondary | ICD-10-CM

## 2014-03-07 DIAGNOSIS — K219 Gastro-esophageal reflux disease without esophagitis: Secondary | ICD-10-CM

## 2014-03-07 DIAGNOSIS — N400 Enlarged prostate without lower urinary tract symptoms: Secondary | ICD-10-CM

## 2014-03-07 DIAGNOSIS — Z87312 Personal history of (healed) stress fracture: Secondary | ICD-10-CM

## 2014-03-07 DIAGNOSIS — K449 Diaphragmatic hernia without obstruction or gangrene: Secondary | ICD-10-CM

## 2014-03-07 DIAGNOSIS — E785 Hyperlipidemia, unspecified: Secondary | ICD-10-CM

## 2014-03-07 NOTE — Progress Notes (Signed)
Subjective:    Patient ID: Austin Graham, male    DOB: 10/01/1950, 63 y.o.   MRN: [id number]  HPI Pt here for follow up and management of chronic medical problems. Recent labs were reviewed with patient. He still has multiple arthralgias and joint pains some related to her arthritis. His father had a history of rheumatoid arthritis. His youngest sister may have a lot of joint issues going on with her also. Because of his degenerative arthritis in his feet up he is unable to do a lot of aggressive physical activity. Cortisone injections in the hip have relieved his discomfort. The orthopedist tells him that he may need a hip replacement in the next year and a half.        Patient Active Problem List   Diagnosis Date Noted  . Prostate lump 12/25/2013  . Hypertension 11/30/2013  . Hyperlipidemia 11/30/2013  . Multinodular goiter 11/30/2013  . Nephrolithiasis 11/30/2013  . BPH (benign prostatic hyperplasia) 11/30/2013  . Degenerative disc disease, lumbar 11/30/2013  . Gastroesophageal reflux disease with hiatal hernia 11/30/2013  . Diabetes, poorly controlled    Outpatient Encounter Prescriptions as of 03/07/2014  Medication Sig  . aspirin 81 MG tablet Take 81 mg by mouth daily.  Marland Kitchen atorvastatin (LIPITOR) 80 MG tablet TAKE 1 TABLET (80 MG TOTAL) BY MOUTH DAILY. AS DIRECTED  . Cholecalciferol (VITAMIN D) 2000 UNITS CAPS Take 1 capsule by mouth daily.  . Cinnamon 500 MG capsule Take 500 mg by mouth daily.  . Cyanocobalamin (VITAMIN B 12 PO) Take 1 tablet by mouth daily.  . fish oil-omega-3 fatty acids 1000 MG capsule Take 2 g by mouth daily.  . Glucose Blood (BLOOD GLUCOSE TEST STRIPS) STRP Use to check BG up to twice daily.  Dx;  250.02.  ONE TOUCH ULTRA TEST STRIPS  . metFORMIN (GLUCOPHAGE-XR) 500 MG 24 hr tablet Take 1 tablet (500 mg total) by mouth 2 (two) times daily.  . Multiple Vitamins-Minerals (CENTRUM SILVER ADULT 50+ PO) Take by mouth daily.  Marland Kitchen olmesartan (BENICAR) 20 MG  tablet Take 20 mg by mouth daily. TAKE 20 MON WED AND FRI. TAKE 10 MG ON TUES, THURS, SAT AND SUN  . ONE TOUCH ULTRA TEST test strip USE FOR TESTING SUGAR DAILY  . Omeprazole Magnesium (PRILOSEC OTC PO) Take by mouth daily.  . [DISCONTINUED] atorvastatin (LIPITOR) 80 MG tablet Take 40 mg by mouth daily. As directed  . [DISCONTINUED] calcium-vitamin D (OSCAL WITH D) 500-200 MG-UNIT per tablet Take 2 tablets by mouth daily.    Review of Systems  Constitutional: Negative.   HENT: Negative.        Decrease in smell and taste  Eyes: Negative.   Respiratory: Negative.   Cardiovascular: Negative.   Gastrointestinal: Negative.   Endocrine: Negative.   Genitourinary: Negative.   Musculoskeletal: Positive for neck pain ("crick in neck").  Skin: Negative.   Allergic/Immunologic: Negative.   Neurological: Negative.   Hematological: Negative.   Psychiatric/Behavioral: Negative.        Objective:   Physical Exam  Nursing note and vitals reviewed. Constitutional: He is oriented to person, place, and time. He appears well-developed and well-nourished. No distress.  HENT:  Head: Normocephalic and atraumatic.  Right Ear: External ear normal.  Left Ear: External ear normal.  Nose: Nose normal.  Mouth/Throat: Oropharynx is clear and moist. No oropharyngeal exudate.  Eyes: Conjunctivae and EOM are normal. Pupils are equal, round, and reactive to light. Right eye exhibits no discharge. Left eye exhibits  no discharge. No scleral icterus.  Neck: Normal range of motion. Neck supple. No thyromegaly present.  No carotid bruits  Cardiovascular: Normal rate, regular rhythm, normal heart sounds and intact distal pulses.  Exam reveals no gallop and no friction rub.   No murmur heard. At 72 per minute  Pulmonary/Chest: Effort normal and breath sounds normal. No respiratory distress. He has no wheezes. He has no rales. He exhibits no tenderness.  Abdominal: Soft. Bowel sounds are normal. He exhibits no  mass. There is no tenderness. There is no rebound and no guarding.  Musculoskeletal: Normal range of motion. He exhibits no edema and no tenderness.  Lymphadenopathy:    He has no cervical adenopathy.  Neurological: He is alert and oriented to person, place, and time. He has normal reflexes. No cranial nerve deficit.  Skin: Skin is warm and dry. No rash noted. No erythema. No pallor.  Psychiatric: He has a normal mood and affect. His behavior is normal. Judgment and thought content normal.   BP 128/78  Pulse 91  Temp(Src) 98.7 F (37.1 C) (Oral)  Ht 5\' 11"  (1.803 m)  Wt 208 lb (94.348 kg)  BMI 29.02 kg/m2  [facility] reading (PRIMARY) by  Dr. Brunilda Payor x-ray-  no active disease, degenerative changes in spine                                      Assessment & Plan:   1. BPH (benign prostatic hyperplasia)  2. Diabetes, poorly controlled  3. Hyperlipidemia  4. Hypertension - DG Chest 2 View; Future  5. Gastroesophageal reflux disease with hiatal hernia  6. Joint pain - Cyclic Citrul Peptide Antibody, IGG; Future - Arthritis Panel; Future - Testosterone,Free and Total; Future  7. History of stress fracture  Patient Instructions  Continue current medications. Continue good therapeutic lifestyle changes which include good diet and exercise. Fall precautions discussed with patient. If an FOBT was given today- please return it to our front desk. If you are over 66 years old - you may need Prevnar 70 or the adult Pneumonia vaccine.  Continue to exercise as much as possible Continue followup appointments with the orthopedist Monitor blood sugars as closely as possible Drink plenty of water Continue atorvastatin as currently doing Increase metformin to 1000 in the morning and 500 in the evening   Arrie Senate MD

## 2014-03-07 NOTE — Patient Instructions (Addendum)
Continue current medications. Continue good therapeutic lifestyle changes which include good diet and exercise. Fall precautions discussed with patient. If an FOBT was given today- please return it to our front desk. If you are over 61 years old - you may need Prevnar 22 or the adult Pneumonia vaccine.  Continue to exercise as much as possible Continue followup appointments with the orthopedist Monitor blood sugars as closely as possible Drink plenty of water Continue atorvastatin as currently doing Increase metformin to 1000 in the morning and 500 in the evening

## 2014-03-13 ENCOUNTER — Other Ambulatory Visit (INDEPENDENT_AMBULATORY_CARE_PROVIDER_SITE_OTHER): Payer: BC Managed Care – PPO

## 2014-03-13 DIAGNOSIS — M2550 Pain in unspecified joint: Secondary | ICD-10-CM

## 2014-03-13 NOTE — Addendum Note (Signed)
Addended by: Pollyann Kennedy F on: 03/13/2014 09:54 AM   Modules accepted: Orders

## 2014-03-13 NOTE — Progress Notes (Signed)
Pt came in for lab  only 

## 2014-03-14 LAB — CYCLIC CITRUL PEPTIDE ANTIBODY, IGG/IGA

## 2014-03-15 LAB — ARTHRITIS PANEL
Anti Nuclear Antibody(ANA): NEGATIVE
Rhuematoid fact SerPl-aCnc: 7.7 IU/mL (ref 0.0–13.9)
SED RATE: 2 mm/h (ref 0–30)
Uric Acid: 4.6 mg/dL (ref 3.7–8.6)

## 2014-03-15 LAB — TESTOSTERONE,FREE AND TOTAL
TESTOSTERONE: 333 ng/dL — AB (ref 348–1197)
Testosterone, Free: 6.6 pg/mL (ref 6.6–18.1)

## 2014-03-15 LAB — CYCLIC CITRUL PEPTIDE ANTIBODY, IGG/IGA: Cyclic Citrullin Peptide Ab: 2 units (ref 0–19)

## 2014-03-16 ENCOUNTER — Telehealth: Payer: Self-pay | Admitting: Family Medicine

## 2014-03-16 NOTE — Telephone Encounter (Signed)
Message copied by Cline Crock on Fri Mar 16, 2014  2:46 PM ------      Message from: Chipper Herb      Created: Thu Mar 15, 2014  6:51 AM       Arthritis profile was negative. The uric acid ANA rheumatoid factor and sedimentation rate were all within normal limits. The anti-CCP,, a specific test for rheumatoid arthritis was also negative.      The total testosterone was low, the direct free  testosterone was at the liver in the normal range. The patient should wait 2 weeks or more and repeat the testosterone levels before 10:00 in the morning ------

## 2014-03-21 ENCOUNTER — Other Ambulatory Visit: Payer: Self-pay | Admitting: *Deleted

## 2014-03-21 DIAGNOSIS — E349 Endocrine disorder, unspecified: Secondary | ICD-10-CM

## 2014-03-21 MED ORDER — METFORMIN HCL ER 500 MG PO TB24: 500.0000 mg | ORAL_TABLET | Freq: Two times a day (BID) | ORAL | Status: DC

## 2014-04-09 ENCOUNTER — Other Ambulatory Visit: Payer: Self-pay | Admitting: Family Medicine

## 2014-05-05 ENCOUNTER — Other Ambulatory Visit: Payer: Self-pay | Admitting: Family Medicine

## 2014-06-06 ENCOUNTER — Other Ambulatory Visit: Payer: Self-pay | Admitting: Family Medicine

## 2014-06-20 ENCOUNTER — Encounter: Payer: Self-pay | Admitting: Family Medicine

## 2014-06-20 ENCOUNTER — Ambulatory Visit (INDEPENDENT_AMBULATORY_CARE_PROVIDER_SITE_OTHER): Payer: BC Managed Care – PPO | Admitting: Family Medicine

## 2014-06-20 VITALS — BP 137/78 | HR 79 | Temp 97.5°F | Ht 71.0 in | Wt 209.0 lb

## 2014-06-20 DIAGNOSIS — E119 Type 2 diabetes mellitus without complications: Secondary | ICD-10-CM

## 2014-06-20 DIAGNOSIS — N400 Enlarged prostate without lower urinary tract symptoms: Secondary | ICD-10-CM

## 2014-06-20 DIAGNOSIS — R7989 Other specified abnormal findings of blood chemistry: Secondary | ICD-10-CM

## 2014-06-20 DIAGNOSIS — E291 Testicular hypofunction: Secondary | ICD-10-CM

## 2014-06-20 DIAGNOSIS — I10 Essential (primary) hypertension: Secondary | ICD-10-CM

## 2014-06-20 DIAGNOSIS — E785 Hyperlipidemia, unspecified: Secondary | ICD-10-CM

## 2014-06-20 DIAGNOSIS — E559 Vitamin D deficiency, unspecified: Secondary | ICD-10-CM

## 2014-06-20 NOTE — Progress Notes (Signed)
Subjective:    Patient ID: Austin Graham, male    DOB: 1951/03/20, 63 y.o.   MRN: [id number]  HPI Pt here for follow up and management of chronic medical problems. The patient indicates that his home blood sugars have been averaging in the 140s and 150s. He is seeing the orthopedic surgeon regularly and anticipates a right hip replacement at some point in the future.        Patient Active Problem List   Diagnosis Date Noted  . Prostate lump 12/25/2013  . Hypertension 11/30/2013  . Hyperlipidemia 11/30/2013  . Multinodular goiter 11/30/2013  . Nephrolithiasis 11/30/2013  . BPH (benign prostatic hyperplasia) 11/30/2013  . Degenerative disc disease, lumbar 11/30/2013  . Gastroesophageal reflux disease with hiatal hernia 11/30/2013  . Diabetes, poorly controlled    Outpatient Encounter Prescriptions as of 06/20/2014  Medication Sig  . aspirin 81 MG tablet Take 81 mg by mouth daily.  Marland Kitchen atorvastatin (LIPITOR) 80 MG tablet TAKE 1 TABLET (80 MG TOTAL) BY MOUTH DAILY. AS DIRECTED  . Cholecalciferol (VITAMIN D) 2000 UNITS CAPS Take 1 capsule by mouth daily.  . fish oil-omega-3 fatty acids 1000 MG capsule Take 2 g by mouth daily.  . Glucose Blood (BLOOD GLUCOSE TEST STRIPS) STRP Use to check BG up to twice daily.  Dx;  250.02.  ONE TOUCH ULTRA TEST STRIPS  . metFORMIN (GLUCOPHAGE-XR) 500 MG 24 hr tablet TAKE 1 TABLET (500 MG TOTAL) BY MOUTH 3 (three) TIMES DAILY.  . Multiple Vitamins-Minerals (CENTRUM SILVER ADULT 50+ PO) Take by mouth daily.  Marland Kitchen olmesartan (BENICAR) 20 MG tablet Take 20 mg by mouth daily. TAKE 20 MON WED AND FRI. TAKE 10 MG ON TUES, THURS, SAT AND SUN  . Omeprazole Magnesium (PRILOSEC OTC PO) Take by mouth daily.  . ONE TOUCH ULTRA TEST test strip USE FOR TESTING SUGAR DAILY  . [DISCONTINUED] BENICAR 40 MG tablet TAKE 1 TABLET BY MOUTH EVERY DAY  . [DISCONTINUED] Cinnamon 500 MG capsule Take 500 mg by mouth daily.  . [DISCONTINUED] Cyanocobalamin (VITAMIN B 12 PO)  Take 1 tablet by mouth daily.  . [DISCONTINUED] metFORMIN (GLUCOPHAGE-XR) 500 MG 24 hr tablet TAKE 1 TABLET (500 MG TOTAL) BY MOUTH 2 (TWO) TIMES DAILY.    Review of Systems  Constitutional: Negative.   HENT: Negative.   Eyes: Negative.   Respiratory: Negative.   Cardiovascular: Negative.   Gastrointestinal: Negative.   Endocrine: Negative.   Genitourinary: Negative.   Musculoskeletal: Negative.   Skin: Negative.   Allergic/Immunologic: Negative.   Neurological: Negative.   Hematological: Negative.   Psychiatric/Behavioral: Negative.        Objective:   Physical Exam  Nursing note and vitals reviewed. Constitutional: He is oriented to person, place, and time. He appears well-developed and well-nourished.  Alert and cooperative  HENT:  Head: Normocephalic and atraumatic.  Right Ear: External ear normal.  Left Ear: External ear normal.  Nose: Nose normal.  Mouth/Throat: Oropharynx is clear and moist. No oropharyngeal exudate.  Eyes: Conjunctivae and EOM are normal. Pupils are equal, round, and reactive to light. Right eye exhibits no discharge. Left eye exhibits no discharge. No scleral icterus.  Neck: Normal range of motion. Neck supple. No thyromegaly present.  Cardiovascular: Normal rate, regular rhythm, normal heart sounds and intact distal pulses.  Exam reveals no gallop and no friction rub.   No murmur heard. Pulmonary/Chest: Effort normal and breath sounds normal. No respiratory distress. He has no wheezes. He has no rales. He exhibits no  tenderness.  Abdominal: Soft. Bowel sounds are normal. He exhibits no mass. There is no tenderness. There is no rebound and no guarding.  Musculoskeletal: He exhibits tenderness (right hip tender). He exhibits no edema.  Decreased range of motion of right hip with pain on flexion and abduction  Lymphadenopathy:    He has no cervical adenopathy.  Neurological: He is alert and oriented to person, place, and time. He has normal reflexes.  No cranial nerve deficit.  Skin: Skin is warm and dry. No rash noted. No erythema. No pallor.  Psychiatric: He has a normal mood and affect. His behavior is normal. Judgment and thought content normal.   BP 137/78  Pulse 79  Temp(Src) 97.5 F (36.4 C) (Oral)  Ht '5\' 11"'  (1.803 m)  Wt 209 lb (94.802 kg)  BMI 29.16 kg/m2        Assessment & Plan:   1. BPH (benign prostatic hyperplasia) - POCT CBC; Future - Testosterone,Free and Total; Future  2. Hyperlipidemia - POCT CBC; Future - NMR, lipoprofile; Future  3. Essential hypertension - POCT CBC; Future - BMP8+EGFR; Future - Hepatic function panel; Future  4. Type 2 diabetes mellitus without complication - POCT CBC; Future - POCT glycosylated hemoglobin (Hb A1C); Future - POCT UA - Microalbumin; Future  5. Decreased testosterone level - Testosterone,Free and Total; Future  6. Vitamin D deficiency - Vit D  25 hydroxy (rtn osteoporosis monitoring); Future  Meds ordered this encounter  Medications  . metFORMIN (GLUCOPHAGE-XR) 500 MG 24 hr tablet    Sig: TAKE 1 TABLET (500 MG TOTAL) BY MOUTH 3 (three) TIMES DAILY.   Patient Instructions  Continue current medications. Continue good therapeutic lifestyle changes which include good diet and exercise. Fall precautions discussed with patient. If an FOBT was given today- please return it to our front desk. If you are over 65 years old - you may need Prevnar 47 or the adult Pneumonia vaccine.  Flu Shots will be available at our office starting mid- September. Please call and schedule a FLU CLINIC APPOINTMENT.   FOBT check with her insurance regarding the Prevnar vaccine, keep followup appointments with orthopedic surgeon Return to the office for fasting lab work Continue to monitor blood sugars at home and stay as active as your hip e be to keep that under control.     Arrie Senate MD

## 2014-06-20 NOTE — Patient Instructions (Addendum)
Continue current medications. Continue good therapeutic lifestyle changes which include good diet and exercise. Fall precautions discussed with patient. If an FOBT was given today- please return it to our front desk. If you are over 61 years old - you may need Prevnar 53 or the adult Pneumonia vaccine.  Flu Shots will be available at our office starting mid- September. Please call and schedule a FLU CLINIC APPOINTMENT.   FOBT check with her insurance regarding the Prevnar vaccine, keep followup appointments with orthopedic surgeon Return to the office for fasting lab work Continue to monitor blood sugars at home and stay as active as your hip e be to keep that under control.

## 2014-07-04 ENCOUNTER — Other Ambulatory Visit (INDEPENDENT_AMBULATORY_CARE_PROVIDER_SITE_OTHER): Payer: BC Managed Care – PPO

## 2014-07-04 DIAGNOSIS — E559 Vitamin D deficiency, unspecified: Secondary | ICD-10-CM

## 2014-07-04 DIAGNOSIS — E785 Hyperlipidemia, unspecified: Secondary | ICD-10-CM

## 2014-07-04 DIAGNOSIS — I10 Essential (primary) hypertension: Secondary | ICD-10-CM

## 2014-07-04 DIAGNOSIS — N400 Enlarged prostate without lower urinary tract symptoms: Secondary | ICD-10-CM

## 2014-07-04 DIAGNOSIS — R7989 Other specified abnormal findings of blood chemistry: Secondary | ICD-10-CM

## 2014-07-04 DIAGNOSIS — E119 Type 2 diabetes mellitus without complications: Secondary | ICD-10-CM

## 2014-07-04 LAB — POCT CBC
Granulocyte percent: 55.1 %G (ref 37–80)
HCT, POC: 44.3 % (ref 43.5–53.7)
HEMOGLOBIN: 14.3 g/dL (ref 14.1–18.1)
LYMPH, POC: 2.3 (ref 0.6–3.4)
MCH, POC: 29.3 pg (ref 27–31.2)
MCHC: 32.2 g/dL (ref 31.8–35.4)
MCV: 91 fL (ref 80–97)
MPV: 6.8 fL (ref 0–99.8)
POC Granulocyte: 3.5 (ref 2–6.9)
POC LYMPH PERCENT: 36.7 %L (ref 10–50)
Platelet Count, POC: 294 10*3/uL (ref 142–424)
RBC: 4.9 M/uL (ref 4.69–6.13)
RDW, POC: 13.6 %
WBC: 6.4 10*3/uL (ref 4.6–10.2)

## 2014-07-04 LAB — POCT UA - MICROALBUMIN: MICROALBUMIN (UR) POC: 20 mg/L

## 2014-07-04 LAB — POCT GLYCOSYLATED HEMOGLOBIN (HGB A1C): Hemoglobin A1C: 7.4

## 2014-07-04 NOTE — Progress Notes (Signed)
Lab only 

## 2014-07-04 NOTE — Addendum Note (Signed)
Addended by: Selmer Dominion on: 07/04/2014 10:14 AM   Modules accepted: Orders

## 2014-07-05 LAB — NMR, LIPOPROFILE
Cholesterol: 163 mg/dL (ref 100–199)
HDL Cholesterol by NMR: 34 mg/dL — ABNORMAL LOW (ref >39)
HDL PARTICLE NUMBER: 29.1 umol/L — AB (ref >=30.5)
LDL Particle Number: 1324 nmol/L — ABNORMAL HIGH (ref <1000)
LDL SIZE: 20.3 nm (ref >20.5)
LDLC SERPL CALC-MCNC: 86 mg/dL (ref 0–99)
LP-IR Score: 76 — ABNORMAL HIGH (ref <=45)
Small LDL Particle Number: 740 nmol/L — ABNORMAL HIGH (ref <=527)
TRIGLYCERIDES BY NMR: 217 mg/dL — AB (ref 0–149)

## 2014-07-05 LAB — HEPATIC FUNCTION PANEL
ALT: 36 IU/L (ref 0–44)
AST: 20 IU/L (ref 0–40)
Albumin: 4.9 g/dL — ABNORMAL HIGH (ref 3.6–4.8)
Alkaline Phosphatase: 85 IU/L (ref 39–117)
Bilirubin, Direct: 0.22 mg/dL (ref 0.00–0.40)
Total Bilirubin: 1 mg/dL (ref 0.0–1.2)
Total Protein: 6.8 g/dL (ref 6.0–8.5)

## 2014-07-05 LAB — BMP8+EGFR
BUN/Creatinine Ratio: 18 (ref 10–22)
BUN: 21 mg/dL (ref 8–27)
CALCIUM: 10 mg/dL (ref 8.6–10.2)
CO2: 23 mmol/L (ref 18–29)
CREATININE: 1.16 mg/dL (ref 0.76–1.27)
Chloride: 101 mmol/L (ref 97–108)
GFR calc Af Amer: 78 mL/min/{1.73_m2} (ref >59)
GFR calc non Af Amer: 68 mL/min/{1.73_m2} (ref >59)
Glucose: 173 mg/dL — ABNORMAL HIGH (ref 65–99)
Potassium: 5.2 mmol/L (ref 3.5–5.2)
Sodium: 140 mmol/L (ref 134–144)

## 2014-07-05 LAB — TESTOSTERONE,FREE AND TOTAL
Testosterone, Free: 10.9 pg/mL (ref 6.6–18.1)
Testosterone: 310 ng/dL — ABNORMAL LOW (ref 348–1197)

## 2014-07-05 LAB — MICROALBUMIN, URINE: Microalbumin, Urine: 10.6 ug/mL (ref 0.0–17.0)

## 2014-07-05 LAB — VITAMIN D 25 HYDROXY (VIT D DEFICIENCY, FRACTURES): Vit D, 25-Hydroxy: 51.9 ng/mL (ref 30.0–100.0)

## 2014-08-07 ENCOUNTER — Other Ambulatory Visit: Payer: Self-pay | Admitting: Family Medicine

## 2014-09-12 ENCOUNTER — Other Ambulatory Visit: Payer: Self-pay | Admitting: Family Medicine

## 2014-09-13 ENCOUNTER — Other Ambulatory Visit: Payer: Self-pay | Admitting: Family Medicine

## 2014-10-04 ENCOUNTER — Encounter: Payer: Self-pay | Admitting: Gastroenterology

## 2014-10-31 ENCOUNTER — Other Ambulatory Visit: Payer: Self-pay | Admitting: Family Medicine

## 2014-11-01 ENCOUNTER — Encounter: Payer: Self-pay | Admitting: Family Medicine

## 2014-11-01 ENCOUNTER — Ambulatory Visit (INDEPENDENT_AMBULATORY_CARE_PROVIDER_SITE_OTHER): Payer: BLUE CROSS/BLUE SHIELD | Admitting: Family Medicine

## 2014-11-01 VITALS — BP 131/76 | HR 111 | Temp 97.0°F | Ht 71.0 in | Wt 210.0 lb

## 2014-11-01 DIAGNOSIS — G8929 Other chronic pain: Secondary | ICD-10-CM

## 2014-11-01 DIAGNOSIS — Z23 Encounter for immunization: Secondary | ICD-10-CM

## 2014-11-01 DIAGNOSIS — M25551 Pain in right hip: Secondary | ICD-10-CM

## 2014-11-01 DIAGNOSIS — R351 Nocturia: Secondary | ICD-10-CM

## 2014-11-01 DIAGNOSIS — N400 Enlarged prostate without lower urinary tract symptoms: Secondary | ICD-10-CM

## 2014-11-01 DIAGNOSIS — E559 Vitamin D deficiency, unspecified: Secondary | ICD-10-CM

## 2014-11-01 DIAGNOSIS — E785 Hyperlipidemia, unspecified: Secondary | ICD-10-CM

## 2014-11-01 DIAGNOSIS — K449 Diaphragmatic hernia without obstruction or gangrene: Secondary | ICD-10-CM

## 2014-11-01 DIAGNOSIS — I10 Essential (primary) hypertension: Secondary | ICD-10-CM

## 2014-11-01 DIAGNOSIS — E119 Type 2 diabetes mellitus without complications: Secondary | ICD-10-CM

## 2014-11-01 DIAGNOSIS — K219 Gastro-esophageal reflux disease without esophagitis: Secondary | ICD-10-CM

## 2014-11-01 LAB — POCT CBC
Granulocyte percent: 60.6 %G (ref 37–80)
HCT, POC: 49.3 % (ref 43.5–53.7)
HEMOGLOBIN: 15.1 g/dL (ref 14.1–18.1)
Lymph, poc: 2.3 (ref 0.6–3.4)
MCH, POC: 28 pg (ref 27–31.2)
MCHC: 30.6 g/dL — AB (ref 31.8–35.4)
MCV: 91.4 fL (ref 80–97)
MPV: 7.4 fL (ref 0–99.8)
POC GRANULOCYTE: 4.3 (ref 2–6.9)
POC LYMPH PERCENT: 33 %L (ref 10–50)
Platelet Count, POC: 263 10*3/uL (ref 142–424)
RBC: 5.4 M/uL (ref 4.69–6.13)
RDW, POC: 13.9 %
WBC: 7.1 10*3/uL (ref 4.6–10.2)

## 2014-11-01 LAB — POCT URINALYSIS DIPSTICK
Bilirubin, UA: NEGATIVE
Blood, UA: NEGATIVE
Glucose, UA: NEGATIVE
KETONES UA: NEGATIVE
Leukocytes, UA: NEGATIVE
NITRITE UA: NEGATIVE
PH UA: 5
SPEC GRAV UA: 1.025
UROBILINOGEN UA: NEGATIVE

## 2014-11-01 LAB — POCT GLYCOSYLATED HEMOGLOBIN (HGB A1C): Hemoglobin A1C: 8.9

## 2014-11-01 LAB — POCT UA - MICROSCOPIC ONLY
Bacteria, U Microscopic: NEGATIVE
Casts, Ur, LPF, POC: NEGATIVE
Crystals, Ur, HPF, POC: NEGATIVE
RBC, urine, microscopic: NEGATIVE
WBC, Ur, HPF, POC: NEGATIVE
Yeast, UA: NEGATIVE

## 2014-11-01 NOTE — Patient Instructions (Addendum)
Continue current medications. Continue good therapeutic lifestyle changes which include good diet and exercise. Fall precautions discussed with patient. If an FOBT was given today- please return it to our front desk. If you are over 62 years old - you may need Prevnar 40 or the adult Pneumonia vaccine.  Flu Shots are still available at our office. If you still haven't had one please call to set up a nurse visit to get one.   After your visit with [place] today you will receive a survey in the mail or online from [employer] regarding your care with [place]. Please take a moment to fill this out. Your feedback is very important to [place] as you can help [place] better understand your patient needs as well as improve your experience and satisfaction. WE CARE ABOUT YOU!!!   Continue to drink plenty of fluids Continue follow-up with orthopedic specialist Make sure that you give [place] a second urine after the prostate exam today Return the FOBT Do not forget to schedule yourself for an eye exam If the urine has infection we will wait for a couple weeks before we do a PSA. We will consider retrying Cialis 5 daily or Rapaflo for your BPH depending on urinalysis

## 2014-11-01 NOTE — Addendum Note (Signed)
Addended by: Zannie Cove on: 11/01/2014 10:41 AM   Modules accepted: Orders

## 2014-11-01 NOTE — Progress Notes (Signed)
Subjective:    Patient ID: Austin Graham, male    DOB: [birth year], 64 y.o.   MRN: [id number]  HPI Pt here for follow up and management of chronic medical problems which include hypertension, hyperlipidemia, and diabetes. He is taking medications regularly.       Patient Active Problem List   Diagnosis Date Noted  . Prostate lump 12/25/2013  . Hypertension 11/30/2013  . Hyperlipidemia 11/30/2013  . Multinodular goiter 11/30/2013  . Nephrolithiasis 11/30/2013  . BPH (benign prostatic hyperplasia) 11/30/2013  . Degenerative disc disease, lumbar 11/30/2013  . Gastroesophageal reflux disease with hiatal hernia 11/30/2013  . Diabetes, poorly controlled    Outpatient Encounter Prescriptions as of 11/01/2014  Medication Sig  . aspirin 81 MG tablet Take 81 mg by mouth daily.  Marland Kitchen atorvastatin (LIPITOR) 80 MG tablet TAKE 1 TABLET (80 MG TOTAL) BY MOUTH DAILY. AS DIRECTED  . Cholecalciferol (VITAMIN D) 2000 UNITS CAPS Take 1 capsule by mouth daily.  . fish oil-omega-3 fatty acids 1000 MG capsule Take 2 g by mouth daily.  . Glucose Blood (BLOOD GLUCOSE TEST STRIPS) STRP Use to check BG up to twice daily.  Dx;  250.02.  ONE TOUCH ULTRA TEST STRIPS  . metFORMIN (GLUCOPHAGE-XR) 500 MG 24 hr tablet TAKE 1 TABLET (500 MG TOTAL) BY MOUTH 2 (TWO) TIMES DAILY.  . Multiple Vitamins-Minerals (CENTRUM SILVER ADULT 50+ PO) Take by mouth daily.  Marland Kitchen olmesartan (BENICAR) 20 MG tablet Take 20 mg by mouth daily. TAKE 20 MON WED AND FRI. TAKE 10 MG ON TUES, THURS, SAT AND SUN  . Omeprazole Magnesium (PRILOSEC OTC PO) Take by mouth daily.  . ONE TOUCH ULTRA TEST test strip USE FOR TESTING SUGAR DAILY  . [DISCONTINUED] atorvastatin (LIPITOR) 80 MG tablet TAKE 1 TABLET (80 MG TOTAL) BY MOUTH DAILY. AS DIRECTED  . [DISCONTINUED] metFORMIN (GLUCOPHAGE-XR) 500 MG 24 hr tablet TAKE 1 TABLET (500 MG TOTAL) BY MOUTH 3 (three) TIMES DAILY.    Review of Systems  Constitutional: Positive for fatigue (during the day).   HENT: Negative.   Eyes: Negative.   Respiratory: Negative.   Cardiovascular: Negative.   Gastrointestinal: Negative.   Endocrine: Negative.   Genitourinary: Positive for frequency (nocturia).  Musculoskeletal: Negative.   Skin: Negative.   Allergic/Immunologic: Negative.   Neurological: Negative.   Hematological: Negative.   Psychiatric/Behavioral: Negative.        Objective:   Physical Exam  Constitutional: He is oriented to person, place, and time. He appears well-developed and well-nourished. No distress.  HENT:  Head: Normocephalic and atraumatic.  Right Ear: External ear normal.  Left Ear: External ear normal.  Nose: Nose normal.  Mouth/Throat: Oropharynx is clear and moist. No oropharyngeal exudate.  Eyes: Conjunctivae and EOM are normal. Pupils are equal, round, and reactive to light. Right eye exhibits no discharge. Left eye exhibits no discharge. No scleral icterus.  Neck: Normal range of motion. Neck supple. No thyromegaly present.  No carotid bruits or thyroid enlargement  Cardiovascular: Normal rate, regular rhythm, normal heart sounds and intact distal pulses.  Exam reveals no gallop and no friction rub.   No murmur heard. 84/m with a regular rate and rhythm  Pulmonary/Chest: Effort normal and breath sounds normal. No respiratory distress. He has no wheezes. He has no rales. He exhibits no tenderness.  No axillary nodes  Abdominal: Soft. Bowel sounds are normal. He exhibits no mass. There is no tenderness. There is no rebound and no guarding.  No inguinal nodes. No abdominal  bruits or organ enlargement  Genitourinary: Rectum normal and penis normal.  The prostate is enlarged it is smooth on the left side and slightly irregular on the right side and this was evaluated by the urologist and felt like that no further workup was necessary. The rectum was clear of any masses. The external genitalia were negative for hernia and the testicles were normal.  Musculoskeletal:  Normal range of motion. He exhibits no edema or tenderness.  With standing and movement there is pain in the right groin area from the degenerated right hip  Lymphadenopathy:    He has no cervical adenopathy.  Neurological: He is alert and oriented to person, place, and time. He has normal reflexes. No cranial nerve deficit.  Skin: Skin is warm and dry. Rash noted. No erythema. No pallor.  Patient has nail fungus and an area of psoriasis right shoulder.  Psychiatric: He has a normal mood and affect. His behavior is normal. Judgment and thought content normal.  Nursing note and vitals reviewed.  BP 131/76 mmHg  Pulse 111  Temp(Src) 97 F (36.1 C) (Oral)  Ht '5\' 11"'  (1.803 m)  Wt 210 lb (95.255 kg)  BMI 29.30 kg/m2        Assessment & Plan:  1. BPH (benign prostatic hyperplasia) -Repeat PSA depending on urinalysis result - POCT CBC - POCT UA - Microscopic Only - POCT urinalysis dipstick -Consider Rapaflo or Cialis 5 for nocturia  2. Hyperlipidemia -Continue current treatment pending results of lab work being done today - POCT CBC - NMR, lipoprofile  3. Essential hypertension -Continue sodium restriction and current treatment - POCT CBC - BMP8+EGFR - Hepatic function panel  4. Type 2 diabetes mellitus without complication -Continue current treatment and aggressive therapeutic lifestyle changes as tolerated pending report of A1c today. - POCT CBC - POCT glycosylated hemoglobin (Hb A1C) - BMP8+EGFR  5. Vitamin D deficiency -Continue current treatment pending vitamin D level today - POCT CBC - Vit D  25 hydroxy (rtn osteoporosis monitoring)  6. Gastroesophageal reflux disease with hiatal hernia -Continue to watch and avoid foods that are irritating to GI tract - POCT CBC - Hepatic function panel  7. Nocturia -We will treat as necessary if infection - POCT CBC - POCT UA - Microscopic Only - POCT urinalysis dipstick  8. Chronic right hip pain -Continue follow-up  with orthopedist  Patient Instructions  Continue current medications. Continue good therapeutic lifestyle changes which include good diet and exercise. Fall precautions discussed with patient. If an FOBT was given today- please return it to our front desk. If you are over 60 years old - you may need Prevnar 58 or the adult Pneumonia vaccine.  Flu Shots are still available at our office. If you still haven't had one please call to set up a nurse visit to get one.   After your visit with [place] today you will receive a survey in the mail or online from [employer] regarding your care with [place]. Please take a moment to fill this out. Your feedback is very important to [place] as you can help [place] better understand your patient needs as well as improve your experience and satisfaction. WE CARE ABOUT YOU!!!   Continue to drink plenty of fluids Continue follow-up with orthopedic specialist Make sure that you give [place] a second urine after the prostate exam today Return the FOBT Do not forget to schedule yourself for an eye exam If the urine has infection we will wait for a couple  weeks before we do a PSA. We will consider retrying Cialis 5 daily or Rapaflo for your BPH depending on urinalysis   Arrie Senate MD

## 2014-11-02 LAB — BMP8+EGFR
BUN / CREAT RATIO: 16 (ref 10–22)
BUN: 19 mg/dL (ref 8–27)
CALCIUM: 10.5 mg/dL — AB (ref 8.6–10.2)
CO2: 23 mmol/L (ref 18–29)
CREATININE: 1.21 mg/dL (ref 0.76–1.27)
Chloride: 100 mmol/L (ref 97–108)
GFR calc Af Amer: 74 mL/min/{1.73_m2} (ref >59)
GFR, EST NON AFRICAN AMERICAN: 64 mL/min/{1.73_m2} (ref >59)
Glucose: 182 mg/dL — ABNORMAL HIGH (ref 65–99)
Potassium: 4.7 mmol/L (ref 3.5–5.2)
Sodium: 141 mmol/L (ref 134–144)

## 2014-11-02 LAB — HEPATIC FUNCTION PANEL
ALT: 55 IU/L — AB (ref 0–44)
AST: 35 IU/L (ref 0–40)
Albumin: 5.2 g/dL — ABNORMAL HIGH (ref 3.6–4.8)
Alkaline Phosphatase: 91 IU/L (ref 39–117)
BILIRUBIN TOTAL: 1.4 mg/dL — AB (ref 0.0–1.2)
Bilirubin, Direct: 0.3 mg/dL (ref 0.00–0.40)
Total Protein: 7.3 g/dL (ref 6.0–8.5)

## 2014-11-02 LAB — NMR, LIPOPROFILE
Cholesterol: 142 mg/dL (ref 100–199)
HDL CHOLESTEROL BY NMR: 36 mg/dL — AB (ref >39)
HDL Particle Number: 30.7 umol/L (ref >=30.5)
LDL PARTICLE NUMBER: 1361 nmol/L — AB (ref <1000)
LDL SIZE: 19.9 nm (ref >20.5)
LDL-C: 60 mg/dL (ref 0–99)
LP-IR SCORE: 77 — AB (ref <=45)
SMALL LDL PARTICLE NUMBER: 989 nmol/L — AB (ref <=527)
TRIGLYCERIDES BY NMR: 229 mg/dL — AB (ref 0–149)

## 2014-11-02 LAB — VITAMIN D 25 HYDROXY (VIT D DEFICIENCY, FRACTURES): Vit D, 25-Hydroxy: 47.1 ng/mL (ref 30.0–100.0)

## 2014-11-12 ENCOUNTER — Ambulatory Visit: Payer: Self-pay

## 2014-11-14 ENCOUNTER — Ambulatory Visit (INDEPENDENT_AMBULATORY_CARE_PROVIDER_SITE_OTHER): Payer: BLUE CROSS/BLUE SHIELD | Admitting: Pharmacist

## 2014-11-14 ENCOUNTER — Encounter: Payer: Self-pay | Admitting: Pharmacist

## 2014-11-14 VITALS — BP 130/72 | HR 85 | Ht 71.0 in | Wt 211.0 lb

## 2014-11-14 DIAGNOSIS — E785 Hyperlipidemia, unspecified: Secondary | ICD-10-CM

## 2014-11-14 DIAGNOSIS — R945 Abnormal results of liver function studies: Principal | ICD-10-CM

## 2014-11-14 DIAGNOSIS — E119 Type 2 diabetes mellitus without complications: Secondary | ICD-10-CM

## 2014-11-14 DIAGNOSIS — R7989 Other specified abnormal findings of blood chemistry: Secondary | ICD-10-CM

## 2014-11-14 NOTE — Progress Notes (Signed)
Patient ID: Austin Graham, male   DOB: 08/17/1951, 64 y.o.   MRN: [id number] Diabetes Follow up  Chief Complaint:   Chief Complaint  Patient presents with  . Diabetes    HPI:  Patient with  type 2 DM.   A1c was 8.9% (11/01/2014) in past he has been able to reduce with diet changes.  Lowest A1c was last fall when it was 7.4% Patient is currently taking metformin XR 548m 1 tablet bid.  He tried to take 3 tablets daily but has diarrhea and stomach cramping.   Only family member with DM is sister  Low fat/carbohydrate diet?  No - has been eating lots of pasta, bread, candy Nicotine Abuse?  No Medication Compliance?  Yes Exercise?  No - due to hip pain Alcohol Abuse?  No  Exam Polyuria:  negative  Polydipsia:  positive Polyphagia:  negative  BMI:  Body mass index is 29.44 kg/(m^2).   Weight changes:  stable General Appearance:  alert, oriented, no acute distress and well nourished Mood/Affect:  normal    Lab Results  Component Value Date   HGBA1C 8.9% 11/01/2014    Lab Results  Component Value Date   CHOL 142 11/01/2014   HDL 36* 11/01/2014   LDLCALC 86 07/04/2014   TRIG 229* 11/01/2014     Medication Checklist: ACE Inhibitor/ARB?  Yes Lipid Lowering Agent?  Yes Aspirin?  Yes Oral Hypoglycemic Agent(s)?  No  Assessment: 1.  type 2 Diabetes.  uncontrolled 2.  Blood Pressure Control.  controlled 3.  Lipid Control.  ldl at goal but Tg elevated  4.  Elevated LFTs 5.  Elevated calcium  Recommendations: 1.   Patient is counseled extensively on carbohydrate counting, serving sizes, saturated fat intake and meal planning.  Patient is instructed to eat 3 meals a day and 3 small snacks.  Patient will supplement snacks based on physical activity.   Increase non-starchy vegetables - carrots, green bean, squash, zucchini, tomatoes, onions, peppers, spinach and other green leafy vegetables, cabbage, lettuce, cucumbers, asparagus, okra (not fried), eggplant limit sugar and  processed foods (cakes, cookies, ice cream, crackers and chips) Increase fresh fruit but limit serving sizes 1/2 cup or about the size of tennis or baseball limit red meat to no more than 1-2 times per week (serving size about the size of your palm) Choose whole grains / lean proteins - whole wheat bread, quinoa, whole grain rice (1/2 cup), fish, chicken, t[place] 2.  30 minutes of physical activity as able - discussed recumbent bike or weights.   3.  Patient is counseled on pathophysiology of diabetes and the risk of long-term complications.  Fasting blood glucose goals are 80-1257mdL.  Post-prandial goals are < 180.  A1C goals < 7.0%. 4.  LDL goal of < 100, HDL > 40 and TG < 150; BP goal < 130/80 5.  Patient is counseled on proper use of glucometer and lancing device.  Patient is informed to check up to bid and how to respond to unsuitable results. 6.  Medication recommendations at this time are as follows: continue metfromin 50039mR bid Discussed adding Januvia or Ivnokana - patient refused as he thinks he can get better control with diet.  I will follow up with patient in 2-3 weeks.  If HBG readings are outside of goals discuss then will add medication.  Orders Placed This Encounter  Procedures  . BMP8+EGFR  . Hepatic function panel     Time spent counseling patient:  45 minutes  Referring provider:  Redge Gainer  PharmD:  Cherre Robins, PHARMD , CDE

## 2014-11-14 NOTE — Patient Instructions (Signed)
Diabetes and Standards of Medical Care   Diabetes is complicated. You may find that your diabetes team includes a dietitian, nurse, diabetes educator, eye doctor, and more. To help everyone know what is going on and to help you get the care you deserve, the following schedule of care was developed to help keep you on track. Below are the tests, exams, vaccines, medicines, education, and plans you will need.  Blood Glucose Goals Prior to meals = 80 - 130 Within 2 hours of the start of a meal = less than 180  HbA1c test (goal is less than 7.0% - your last value was %) This test shows how well you have controlled your glucose over the past 2 to 3 months. It is used to see if your diabetes management plan needs to be adjusted.   It is performed at least 2 times a year if you are meeting treatment goals.  It is performed 4 times a year if therapy has changed or if you are not meeting treatment goals.  Blood pressure test  This test is performed at every routine medical visit. The goal is less than 140/90 mmHg for most people, but 130/80 mmHg in some cases. Ask your health care provider about your goal.  Dental exam  Follow up with the dentist regularly.  Eye exam  If you are diagnosed with type 1 diabetes as a child, get an exam upon reaching the age of 33 years or older and have had diabetes for 3 to 5 years. Yearly eye exams are recommended after that initial eye exam.  If you are diagnosed with type 1 diabetes as an adult, get an exam within 5 years of diagnosis and then yearly.  If you are diagnosed with type 2 diabetes, get an exam as soon as possible after the diagnosis and then yearly.  Foot care exam  Visual foot exams are performed at every routine medical visit. The exams check for cuts, injuries, or other problems with the feet.  A comprehensive foot exam should be done yearly. This includes visual inspection as well as assessing foot pulses and testing for loss of  sensation.  Check your feet nightly for cuts, injuries, or other problems with your feet. Tell your health care provider if anything is not healing.  Kidney function test (urine microalbumin)  This test is performed once a year.  Type 1 diabetes: The first test is performed 5 years after diagnosis.  Type 2 diabetes: The first test is performed at the time of diagnosis.  A serum creatinine and estimated glomerular filtration rate (eGFR) test is done once a year to assess the level of chronic kidney disease (CKD), if present.  Lipid profile (cholesterol, HDL, LDL, triglycerides)  Performed every 5 years for most people.  The goal for LDL is less than 100 mg/dL. If you are at high risk, the goal is less than 70 mg/dL.  The goal for HDL is 40 mg/dL to 50 mg/dL for men and 50 mg/dL to 60 mg/dL for women. An HDL cholesterol of 60 mg/dL or higher gives some protection against heart disease.  The goal for triglycerides is less than 150 mg/dL.  Influenza vaccine, pneumococcal vaccine, and hepatitis B vaccine  The influenza vaccine is recommended yearly.  The pneumococcal vaccine is generally given once in a lifetime. However, there are some instances when another vaccination is recommended. Check with your health care provider.  The hepatitis B vaccine is also recommended for adults with diabetes.  Diabetes self-management education  Education is recommended at diagnosis and ongoing as needed.  Treatment plan  Your treatment plan is reviewed at every medical visit.  Document Released: 07/12/2009 Document Revised: 05/17/2013 Document Reviewed: 02/14/2013 [employer] Patient Information 2014 [employer].   Increase non-starchy vegetables - carrots, green bean, squash, zucchini, tomatoes, onions, peppers, spinach and other green leafy vegetables, cabbage, lettuce, cucumbers, asparagus, okra (not fried), eggplant limit sugar and processed foods (cakes, cookies, ice cream, crackers  and chips) Increase fresh fruit but limit serving sizes 1/2 cup or about the size of tennis or baseball limit red meat to no more than 1-2 times per week (serving size about the size of your palm) Choose whole grains / lean proteins - whole wheat bread, quinoa, whole grain rice (1/2 cup), fish, chicken, turkey Rye bread is OK  

## 2014-11-15 LAB — HEPATIC FUNCTION PANEL
ALT: 47 IU/L — AB (ref 0–44)
AST: 24 IU/L (ref 0–40)
Albumin: 5 g/dL — ABNORMAL HIGH (ref 3.6–4.8)
Alkaline Phosphatase: 102 IU/L (ref 39–117)
Bilirubin Total: 0.9 mg/dL (ref 0.0–1.2)
Bilirubin, Direct: 0.23 mg/dL (ref 0.00–0.40)
Total Protein: 7 g/dL (ref 6.0–8.5)

## 2014-11-15 LAB — BMP8+EGFR
BUN/Creatinine Ratio: 12 (ref 10–22)
BUN: 14 mg/dL (ref 8–27)
CALCIUM: 10.4 mg/dL — AB (ref 8.6–10.2)
CO2: 23 mmol/L (ref 18–29)
CREATININE: 1.14 mg/dL (ref 0.76–1.27)
Chloride: 102 mmol/L (ref 97–108)
GFR calc Af Amer: 79 mL/min/{1.73_m2} (ref >59)
GFR, EST NON AFRICAN AMERICAN: 69 mL/min/{1.73_m2} (ref >59)
GLUCOSE: 130 mg/dL — AB (ref 65–99)
Potassium: 5.2 mmol/L (ref 3.5–5.2)
Sodium: 142 mmol/L (ref 134–144)

## 2014-11-22 ENCOUNTER — Other Ambulatory Visit: Payer: Self-pay | Admitting: *Deleted

## 2014-11-22 DIAGNOSIS — I10 Essential (primary) hypertension: Secondary | ICD-10-CM

## 2014-12-20 ENCOUNTER — Other Ambulatory Visit (INDEPENDENT_AMBULATORY_CARE_PROVIDER_SITE_OTHER): Payer: BLUE CROSS/BLUE SHIELD

## 2014-12-20 DIAGNOSIS — E349 Endocrine disorder, unspecified: Secondary | ICD-10-CM

## 2014-12-20 DIAGNOSIS — E291 Testicular hypofunction: Secondary | ICD-10-CM | POA: Diagnosis not present

## 2014-12-20 DIAGNOSIS — I10 Essential (primary) hypertension: Secondary | ICD-10-CM

## 2014-12-20 NOTE — Progress Notes (Signed)
Lab Only.

## 2014-12-21 LAB — BMP8+EGFR
BUN/Creatinine Ratio: 12 (ref 10–22)
BUN: 14 mg/dL (ref 8–27)
CALCIUM: 10.2 mg/dL (ref 8.6–10.2)
CHLORIDE: 103 mmol/L (ref 97–108)
CO2: 23 mmol/L (ref 18–29)
CREATININE: 1.13 mg/dL (ref 0.76–1.27)
GFR calc Af Amer: 80 mL/min/{1.73_m2} (ref >59)
GFR calc non Af Amer: 69 mL/min/{1.73_m2} (ref >59)
Glucose: 128 mg/dL — ABNORMAL HIGH (ref 65–99)
POTASSIUM: 5.2 mmol/L (ref 3.5–5.2)
SODIUM: 142 mmol/L (ref 134–144)

## 2014-12-27 ENCOUNTER — Telehealth: Payer: Self-pay | Admitting: Family Medicine

## 2014-12-27 ENCOUNTER — Other Ambulatory Visit (INDEPENDENT_AMBULATORY_CARE_PROVIDER_SITE_OTHER): Payer: BLUE CROSS/BLUE SHIELD

## 2014-12-27 DIAGNOSIS — N402 Nodular prostate without lower urinary tract symptoms: Secondary | ICD-10-CM

## 2014-12-27 NOTE — Telephone Encounter (Signed)
Patient has an appointment with Dr Felicie Morn at [facility] on Monday for yearly follow up on prostate nodule. PSA was not done with labs in February. Ordered PSA for patient to come by this evening and have it drawn.  We will forward this result to Dr Felicie Morn so he will have it on Monday.

## 2014-12-28 ENCOUNTER — Telehealth: Payer: Self-pay | Admitting: Pharmacist

## 2014-12-28 LAB — PSA, TOTAL AND FREE
PSA FREE: 0.1 ng/mL
PSA, Free Pct: 25 %
PSA: 0.4 ng/mL (ref 0.0–4.0)

## 2014-12-28 NOTE — Telephone Encounter (Signed)
-----   Message from [place], [place] sent at 11/14/2014  4:27 PM EST ----- Regarding: check BG Called to check on BG readings

## 2014-12-28 NOTE — Telephone Encounter (Signed)
Tried to call to follow up HBG readings.  Left message to call office

## 2014-12-28 NOTE — Telephone Encounter (Signed)
Patient has lost about 10#.  BG readings at home has been better - 130's most days.  Continue with diet changes.

## 2015-01-01 ENCOUNTER — Other Ambulatory Visit: Payer: Self-pay | Admitting: Family Medicine

## 2015-01-31 ENCOUNTER — Other Ambulatory Visit (INDEPENDENT_AMBULATORY_CARE_PROVIDER_SITE_OTHER): Payer: BLUE CROSS/BLUE SHIELD

## 2015-01-31 DIAGNOSIS — E291 Testicular hypofunction: Secondary | ICD-10-CM

## 2015-01-31 DIAGNOSIS — E118 Type 2 diabetes mellitus with unspecified complications: Secondary | ICD-10-CM

## 2015-01-31 DIAGNOSIS — E349 Endocrine disorder, unspecified: Secondary | ICD-10-CM

## 2015-01-31 DIAGNOSIS — E119 Type 2 diabetes mellitus without complications: Secondary | ICD-10-CM

## 2015-01-31 LAB — POCT GLYCOSYLATED HEMOGLOBIN (HGB A1C): Hemoglobin A1C: 6.3

## 2015-01-31 NOTE — Progress Notes (Signed)
Lab only 

## 2015-02-02 ENCOUNTER — Telehealth: Payer: Self-pay | Admitting: *Deleted

## 2015-02-02 LAB — TESTOSTERONE,FREE AND TOTAL
TESTOSTERONE: 323 ng/dL — AB (ref 348–1197)
Testosterone, Free: 6.8 pg/mL (ref 6.6–18.1)

## 2015-02-02 NOTE — Telephone Encounter (Signed)
-----   Message from Chipper Herb, MD sent at 02/02/2015  7:13 AM EDT ----- The total testosterone level is low at 323 the free direct testosterone level is very close to the low end of the normal range. This is consistent with past readings. If the patient is interested in taking testosterone we will be glad to prescribe a prescription for him. If he is having symptoms of fatigue and decreased libido etc. then he should let us know and consider trying topical treatment++++++

## 2015-02-02 NOTE — Telephone Encounter (Signed)
Need to go over labs- [facility]

## 2015-02-20 ENCOUNTER — Other Ambulatory Visit: Payer: Self-pay | Admitting: Family Medicine

## 2015-03-06 ENCOUNTER — Other Ambulatory Visit: Payer: Self-pay | Admitting: Family Medicine

## 2015-03-21 ENCOUNTER — Ambulatory Visit (INDEPENDENT_AMBULATORY_CARE_PROVIDER_SITE_OTHER): Payer: BLUE CROSS/BLUE SHIELD | Admitting: Family Medicine

## 2015-03-21 ENCOUNTER — Encounter: Payer: Self-pay | Admitting: Family Medicine

## 2015-03-21 VITALS — BP 119/71 | HR 84 | Temp 97.7°F | Ht 71.0 in | Wt 196.0 lb

## 2015-03-21 DIAGNOSIS — K449 Diaphragmatic hernia without obstruction or gangrene: Secondary | ICD-10-CM | POA: Diagnosis not present

## 2015-03-21 DIAGNOSIS — E785 Hyperlipidemia, unspecified: Secondary | ICD-10-CM | POA: Diagnosis not present

## 2015-03-21 DIAGNOSIS — N400 Enlarged prostate without lower urinary tract symptoms: Secondary | ICD-10-CM | POA: Diagnosis not present

## 2015-03-21 DIAGNOSIS — M1712 Unilateral primary osteoarthritis, left knee: Secondary | ICD-10-CM

## 2015-03-21 DIAGNOSIS — E559 Vitamin D deficiency, unspecified: Secondary | ICD-10-CM | POA: Diagnosis not present

## 2015-03-21 DIAGNOSIS — I10 Essential (primary) hypertension: Secondary | ICD-10-CM | POA: Diagnosis not present

## 2015-03-21 DIAGNOSIS — E119 Type 2 diabetes mellitus without complications: Secondary | ICD-10-CM | POA: Diagnosis not present

## 2015-03-21 DIAGNOSIS — K219 Gastro-esophageal reflux disease without esophagitis: Secondary | ICD-10-CM

## 2015-03-21 DIAGNOSIS — M1611 Unilateral primary osteoarthritis, right hip: Secondary | ICD-10-CM | POA: Diagnosis not present

## 2015-03-21 LAB — POCT CBC
Granulocyte percent: 62.5 %G (ref 37–80)
HCT, POC: 44 % (ref 43.5–53.7)
HEMOGLOBIN: 14.4 g/dL (ref 14.1–18.1)
Lymph, poc: 2.1 (ref 0.6–3.4)
MCH: 30 pg (ref 27–31.2)
MCHC: 32.7 g/dL (ref 31.8–35.4)
MCV: 91.6 fL (ref 80–97)
MPV: 6.7 fL (ref 0–99.8)
POC Granulocyte: 4.4 (ref 2–6.9)
POC LYMPH PERCENT: 30.3 %L (ref 10–50)
Platelet Count, POC: 263 10*3/uL (ref 142–424)
RBC: 4.8 M/uL (ref 4.69–6.13)
RDW, POC: 13.1 %
WBC: 7 10*3/uL (ref 4.6–10.2)

## 2015-03-21 LAB — POCT GLYCOSYLATED HEMOGLOBIN (HGB A1C): Hemoglobin A1C: 6

## 2015-03-21 MED ORDER — GLUCOSE BLOOD VI STRP: ORAL_STRIP | Status: DC

## 2015-03-21 NOTE — Progress Notes (Signed)
Subjective:    Patient ID: Austin Graham, male    DOB: 05/06/1951, 64 y.o.   MRN: [id number]  HPI Pt here for follow up and management of chronic medical problems which includes hypertension, hyperlipidemia, and diabetes. He is taking medications regularly.        Patient Active Problem List   Diagnosis Date Noted  . Prostate lump 12/25/2013  . Hypertension 11/30/2013  . Hyperlipidemia 11/30/2013  . Multinodular goiter 11/30/2013  . Nephrolithiasis 11/30/2013  . BPH (benign prostatic hyperplasia) 11/30/2013  . Degenerative disc disease, lumbar 11/30/2013  . Gastroesophageal reflux disease with hiatal hernia 11/30/2013  . Diabetes, poorly controlled    Outpatient Encounter Prescriptions as of 03/21/2015  Medication Sig  . aspirin 81 MG tablet Take 81 mg by mouth daily.  Marland Kitchen atorvastatin (LIPITOR) 80 MG tablet TAKE 1 TABLET (80 MG TOTAL) BY MOUTH DAILY. AS DIRECTED (Patient taking differently: 28m daily)  . BENICAR 40 MG tablet Take 40 mg by mouth daily.  . Cholecalciferol (VITAMIN D) 2000 UNITS CAPS Take 1 capsule by mouth daily.  . fish oil-omega-3 fatty acids 1000 MG capsule Take 2 g by mouth daily.  . Glucose Blood (BLOOD GLUCOSE TEST STRIPS) STRP Use to check BG up to twice daily.  Dx;  250.02.  ONE TOUCH ULTRA TEST STRIPS  . metFORMIN (GLUCOPHAGE-XR) 500 MG 24 hr tablet TAKE 1 TABLET (500 MG TOTAL) BY MOUTH 2 (TWO) TIMES DAILY. (Patient taking differently: TAKE 1 TABLET (500 MG TOTAL) BY MOUTH 3 (Three) TIMES DAILY.)  . Multiple Vitamins-Minerals (CENTRUM SILVER ADULT 50+ PO) Take by mouth daily.  . ONE TOUCH ULTRA TEST test strip USE FOR TESTING SUGAR DAILY  . [DISCONTINUED] atorvastatin (LIPITOR) 80 MG tablet TAKE 1 TABLET (80 MG TOTAL) BY MOUTH DAILY. AS DIRECTED  . [DISCONTINUED] metFORMIN (GLUCOPHAGE-XR) 500 MG 24 hr tablet TAKE 1 TABLET BY MOUTH TWICE A DAY  . [DISCONTINUED] olmesartan (BENICAR) 20 MG tablet Take 20 mg by mouth daily. TAKE 20 MON WED AND FRI. TAKE  10 MG ON TUES, THURS, SAT AND SUN  . [DISCONTINUED] Omeprazole Magnesium (PRILOSEC OTC PO) Take by mouth daily.   No facility-administered encounter medications on file as of 03/21/2015.     Review of Systems  Constitutional: Negative.   HENT: Negative.   Eyes: Negative.   Respiratory: Negative.   Cardiovascular: Negative.   Gastrointestinal: Negative.   Endocrine: Negative.   Genitourinary: Negative.   Musculoskeletal: Positive for arthralgias (left knee and right hip pain).  Skin: Negative.   Allergic/Immunologic: Negative.   Neurological: Negative.   Hematological: Negative.   Psychiatric/Behavioral: Negative.        Objective:   Physical Exam  Constitutional: He is oriented to person, place, and time. He appears well-developed and well-nourished. No distress.  HENT:  Head: Normocephalic and atraumatic.  Right Ear: External ear normal.  Left Ear: External ear normal.  Nose: Nose normal.  Mouth/Throat: Oropharynx is clear and moist. No oropharyngeal exudate.  Eyes: Conjunctivae and EOM are normal. Pupils are equal, round, and reactive to light. Right eye exhibits no discharge. Left eye exhibits no discharge. No scleral icterus.  Neck: Normal range of motion. Neck supple. No thyromegaly present.  Cardiovascular: Normal rate, regular rhythm, normal heart sounds and intact distal pulses.   No murmur heard. At 72/m  Pulmonary/Chest: Effort normal and breath sounds normal. No respiratory distress. He has no wheezes. He has no rales. He exhibits no tenderness.  Abdominal: Soft. Bowel sounds are normal. He exhibits  no mass. There is no tenderness. There is no rebound and no guarding.  Genitourinary:  The patient saw the urologist in March and had a PSA and a rectal exam.  Musculoskeletal: Normal range of motion. He exhibits no edema or tenderness.  There is limited range of motion and tenderness in the right hip and grating in the left knee.  Lymphadenopathy:    He has no  cervical adenopathy.  Neurological: He is alert and oriented to person, place, and time. He has normal reflexes. No cranial nerve deficit.  Skin: Skin is warm and dry. No rash noted. No erythema. No pallor.  The patient has some areas of eczema on his skin.  Psychiatric: He has a normal mood and affect. His behavior is normal. Judgment and thought content normal.  Nursing note and vitals reviewed.   BP 119/71 mmHg  Pulse 84  Temp(Src) 97.7 F (36.5 C) (Oral)  Ht '5\' 11"'  (1.803 m)  Wt 196 lb (88.905 kg)  BMI 27.35 kg/m2       Assessment & Plan:  1. Type 2 diabetes mellitus without complication -The patient is doing much better with his diabetic control with an A1c this morning of 6.0%. He is lost 17 pounds since February. His BMI is still elevated he is going to continue to work on the weight loss and blood sugar control. - POCT CBC - POCT glycosylated hemoglobin (Hb A1C)  2. BPH (benign prostatic hyperplasia) -He just saw the urologist 3 months ago and had his PSA - POCT CBC  3. Hyperlipidemia -He should continue with the atorvastatin pending results of lab work he should continue with his weight loss regimen and diet - POCT CBC - NMR, lipoprofile  4. Essential hypertension -Blood pressure is good today he should continue with current treatment - POCT CBC - BMP8+EGFR - Hepatic function panel  5. Vitamin D deficiency -The vitamin D level is pending and he should continue with his current vitamin D treatment until those results are back - POCT CBC - Vit D  25 hydroxy (rtn osteoporosis monitoring)  6. Gastroesophageal reflux disease with hiatal hernia -He has no complaints with his GERD at this point in time. He should continue with diet habits and avoid fried foods. - POCT CBC  7. Primary osteoarthritis of right hip -Follow-up with orthopedic surgeon as planned and hip replacement in September  8. Primary osteoarthritis of left knee -Follow-up with orthopedic surgeon  as planned  Meds ordered this encounter  Medications  . BENICAR 40 MG tablet    Sig: Take 40 mg by mouth daily.    Refill:  1  . glucose blood (ONE TOUCH ULTRA TEST) test strip    Sig: Check BS QD and PRN. E11.9    Dispense:  100 each    Refill:  11    Dx 250.00   Patient Instructions  Continue current medications. Continue good therapeutic lifestyle changes which include good diet and exercise. Fall precautions discussed with patient. If an FOBT was given today- please return it to our front desk. If you are over 44 years old - you may need Prevnar 71 or the adult Pneumonia vaccine.  Flu Shots are still available at our office. If you still haven't had one please call to set up a nurse visit to get one.   After your visit with [place] today you will receive a survey in the mail or online from [employer] regarding your care with [place]. Please take a moment to  fill this out. Your feedback is very important to [place] as you can help [place] better understand your patient needs as well as improve your experience and satisfaction. WE CARE ABOUT YOU!!!   The patient should continue to follow-up with orthopedist as planned He should continue to exercise as much as possible and follow his diet as aggressively as possible and continue with the weight loss regimen because his BMI is still elevated at 29.   Arrie Senate MD

## 2015-03-21 NOTE — Patient Instructions (Addendum)
Continue current medications. Continue good therapeutic lifestyle changes which include good diet and exercise. Fall precautions discussed with patient. If an FOBT was given today- please return it to our front desk. If you are over 62 years old - you may need Prevnar 18 or the adult Pneumonia vaccine.  Flu Shots are still available at our office. If you still haven't had one please call to set up a nurse visit to get one.   After your visit with [place] today you will receive a survey in the mail or online from [employer] regarding your care with [place]. Please take a moment to fill this out. Your feedback is very important to [place] as you can help [place] better understand your patient needs as well as improve your experience and satisfaction. WE CARE ABOUT YOU!!!   The patient should continue to follow-up with orthopedist as planned He should continue to exercise as much as possible and follow his diet as aggressively as possible and continue with the weight loss regimen because his BMI is still elevated at 29.

## 2015-03-22 LAB — BMP8+EGFR
BUN/Creatinine Ratio: 12 (ref 10–22)
BUN: 14 mg/dL (ref 8–27)
CALCIUM: 10 mg/dL (ref 8.6–10.2)
CO2: 22 mmol/L (ref 18–29)
Chloride: 102 mmol/L (ref 97–108)
Creatinine, Ser: 1.15 mg/dL (ref 0.76–1.27)
GFR, EST AFRICAN AMERICAN: 78 mL/min/{1.73_m2} (ref >59)
GFR, EST NON AFRICAN AMERICAN: 68 mL/min/{1.73_m2} (ref >59)
Glucose: 129 mg/dL — ABNORMAL HIGH (ref 65–99)
Potassium: 4.8 mmol/L (ref 3.5–5.2)
SODIUM: 143 mmol/L (ref 134–144)

## 2015-03-22 LAB — NMR, LIPOPROFILE
Cholesterol: 130 mg/dL (ref 100–199)
HDL CHOLESTEROL BY NMR: 39 mg/dL — AB (ref >39)
HDL Particle Number: 28.9 umol/L — ABNORMAL LOW (ref >=30.5)
LDL Particle Number: 861 nmol/L (ref <1000)
LDL Size: 20.1 nm (ref >20.5)
LDL-C: 63 mg/dL (ref 0–99)
LP-IR Score: 63 — ABNORMAL HIGH (ref <=45)
Small LDL Particle Number: 510 nmol/L (ref <=527)
Triglycerides by NMR: 139 mg/dL (ref 0–149)

## 2015-03-22 LAB — HEPATIC FUNCTION PANEL
ALK PHOS: 85 IU/L (ref 39–117)
ALT: 31 IU/L (ref 0–44)
AST: 20 IU/L (ref 0–40)
Albumin: 4.8 g/dL (ref 3.6–4.8)
BILIRUBIN TOTAL: 0.8 mg/dL (ref 0.0–1.2)
BILIRUBIN, DIRECT: 0.21 mg/dL (ref 0.00–0.40)
TOTAL PROTEIN: 6.5 g/dL (ref 6.0–8.5)

## 2015-03-22 LAB — VITAMIN D 25 HYDROXY (VIT D DEFICIENCY, FRACTURES): Vit D, 25-Hydroxy: 71.6 ng/mL (ref 30.0–100.0)

## 2015-04-25 ENCOUNTER — Encounter: Payer: Self-pay | Admitting: *Deleted

## 2015-05-09 ENCOUNTER — Other Ambulatory Visit: Payer: Self-pay | Admitting: Family Medicine

## 2015-05-21 ENCOUNTER — Ambulatory Visit: Payer: Self-pay | Admitting: Orthopedic Surgery

## 2015-05-21 NOTE — H&P (Signed)
Austin Graham DOB: 1951-05-03 Married / Language: English / Race: White Male Date of Admission:  06/12/2015 CC:  Right Hip Pain History of Present Illness  The patient is a 64 year old male who comes in for a preoperative History and Physical. The patient is scheduled for a right total hip arthroplasty (anterior) to be performed by Dr. Dione Plover. Aluisio, MD at [facility] on 06/12/2015. The patient is a 64 year old male who presented for follow up of their hip. The patient is being followed for their right hip pain and osteoarthritis. They are 6 month(s) out from intra-articular injection. Symptoms reported today include: pain and aching. The patient feels that they are doing poorly and report their pain level to be mild to moderate. The following medication has been used for pain control: antiinflammatory medication (Motrin). The patient has not gotten any relief of their symptoms with Cortisone injections (helped for 2-3 weeks). He states that the hip is getting progressively worse. Since we saw him last he now has increased pain and more functional limitations. Cortisone injection did help some but it did not last long. He is at a stage now where the hip has essentially taken over his life and is now ready to get the hip replaced. They have been treated conservatively in the past for the above stated problem and despite conservative measures, they continue to have progressive pain and severe functional limitations and dysfunction. They have failed non-operative management including home exercise, medications, and injections. It is felt that they would benefit from undergoing total joint replacement. Risks and benefits of the procedure have been discussed with the patient and they elect to proceed with surgery. There are no active contraindications to surgery such as ongoing infection or rapidly progressive neurological disease.  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  Family History Father - Cardiovascular Disease Mother - Cancer  Social History  Alcohol use Occasional alcohol use. Tobacco use Past Smoker Social Intake of Alcohol - 1 to 2 drinks per week. Living Will  Medication History  Fish Oil Active. MetFORMIN HCl ER (500MG  Tablet ER 24HR, 3 tabs daily Oral) Active. Atorvastatin Calcium (80MG  Tablet, Oral) Active. Benicar (20MG  Tablet, Oral) Active. One-A-Day Mens (1 Oral) Active. Vitamin D (2000UNIT Tablet, 1 Oral) Active. Cinnamon (500MG  Capsule, Oral) Active. Motrin (Oral) Specific dose unknown - Active.  Past Surgical History Kidney Stone Surgery  Review of Systems General Present- Night Sweats. Not Present- Chills, Fatigue, Fever, Memory Loss, Weight Gain and Weight Loss. Skin Not Present- Eczema, Hives, Itching, Lesions and Rash. HEENT Not Present- Dentures, Double Vision, Headache, Hearing Loss, Tinnitus and Visual Loss. Respiratory Present- Wheezing. Not Present- Allergies, Chronic Cough, Coughing up blood, Shortness of breath at rest and Shortness of breath with exertion. Cardiovascular Not Present- Chest Pain, Difficulty Breathing Lying Down, Murmur, Palpitations, Racing/skipping heartbeats and Swelling. Gastrointestinal Present- Constipation and Heartburn. Not Present- Abdominal Pain, Bloody Stool, Diarrhea, Difficulty Swallowing, Jaundice, Loss of appetitie, Nausea and Vomiting. Male Genitourinary Present- Urinary frequency, Urinating at Night and  Weak urinary stream. Not Present- Blood in Urine, Discharge, Flank Pain, Incontinence, Painful Urination, Urgency and Urinary Retention. Musculoskeletal Present- Back Pain, Joint Pain, Morning  Stiffness and Spasms. Not Present- Joint Swelling, Muscle Pain and Muscle Weakness. Neurological Not Present- Blackout spells, Difficulty with balance, Dizziness, Paralysis, Tremor and Weakness. Psychiatric Not Present- Insomnia.  Vitals Weight: 196 lb Height: 72in Weight was reported by patient. Height was reported by patient. Body Surface Area: 2.11 m Body Mass Index: 26.58 kg/m  BP: 142/92 (Sitting, Left Arm, Standard)  Physical Exam  General Mental Status -Alert, cooperative and good historian. General Appearance-pleasant, Not in acute distress. Orientation-Oriented X3. Build & Nutrition-Well nourished and Well developed.  Head and Neck Head-normocephalic, atraumatic . Neck Global Assessment - supple, no bruit auscultated on the right, no bruit auscultated on the left.  Eye Pupil - Bilateral-Regular and Round. Motion - Bilateral-EOMI.  Chest and Lung Exam Auscultation Breath sounds - clear at anterior chest wall and clear at posterior chest wall. Adventitious sounds - No Adventitious sounds.  Cardiovascular Auscultation Rhythm - Regular rate and rhythm. Heart Sounds - S1 WNL and S2 WNL. Murmurs & Other Heart Sounds - Auscultation of the heart reveals - No Murmurs.  Abdomen Palpation/Percussion Tenderness - Abdomen is non-tender to palpation. Rigidity (guarding) - Abdomen is soft. Auscultation Auscultation of the abdomen reveals - Bowel sounds normal.  Male Genitourinary Note: Not done, not pertinent to present illness  Musculoskeletal Note: He is alert and oriented, no apparent distress. Right hip can be flexed to about 90, minimal internal rotation, about 20 external rotation, 20 abduction. His left hip has normal range of motion without  discomfort.  RADIOGRAPHS: AP pelvis, AP and lateral of the right hip show that he is just about bone on bone in that right hip now. He has some osteophyte formation, subchondral cystic change. This has definitely progressed since his last x-ray.  Assessment & Plan Primary osteoarthritis of right hip (M16.11) Note:Surgical Plans: Right Total Hip Replacement - Anterior Approach  Disposition: Home  PCP: Dr. Morrie Sheldon  IV TXA  Anesthesia Issues: None  Signed electronically by Joelene Millin, III PA-C

## 2015-05-31 ENCOUNTER — Ambulatory Visit: Payer: Self-pay | Admitting: Orthopedic Surgery

## 2015-05-31 NOTE — Patient Instructions (Signed)
Thimothy Barretta  05/31/2015   Your procedure is scheduled on: June 12, 2015  Report to [facility] Main  Entrance take [place]  elevators to 3rd floor to  [place] at  6:30  AM.  Call this number if you have problems the morning of surgery [phone]   Remember: ONLY 1 PERSON MAY GO WITH YOU TO SHORT STAY TO GET  READY MORNING OF [place].  Do not eat food or drink liquids :After Midnight.     Take these medicines the morning of surgery with A SIP OF WATER: None                               You may not have any metal on your body including hair pins and              piercings  Do not wear jewelry,  lotions, powders or perfumes, deodorant              Men may shave face and neck.   Do not bring valuables to the hospital. [place].  Contacts, dentures or bridgework may not be worn into surgery.  Leave suitcase in the car. After surgery it may be brought to your room.        Special Instructions : coughing and deep breathing exercises, leg exercises              Please read over the following fact sheets you were given: _____________________________________________________________________             [facility] - Preparing for Surgery Before surgery, you can play an important role.  Because skin is not sterile, your skin needs to be as free of germs as possible.  You can reduce the number of germs on your skin by washing with CHG (chlorahexidine gluconate) soap before surgery.  CHG is an antiseptic cleaner which kills germs and bonds with the skin to continue killing germs even after washing. Please DO NOT use if you have an allergy to CHG or antibacterial soaps.  If your skin becomes reddened/irritated stop using the CHG and inform your nurse when you arrive at Short Stay. Do not shave (including legs and underarms) for at least 48 hours prior to the first CHG shower.  You may shave your  face/neck. Please follow these instructions carefully:  1.  Shower with CHG Soap the night before surgery and the  morning of Surgery.  2.  If you choose to wash your hair, wash your hair first as usual with your  normal  shampoo.  3.  After you shampoo, rinse your hair and body thoroughly to remove the  shampoo.                           4.  Use CHG as you would any other liquid soap.  You can apply chg directly  to the skin and wash                       Gently with a scrungie or clean washcloth.  5.  Apply the CHG Soap to your body ONLY FROM THE NECK DOWN.   Do not use on  face/ open                           Wound or open sores. Avoid contact with eyes, ears mouth and genitals (private parts).                       Wash face,  Genitals (private parts) with your normal soap.             6.  Wash thoroughly, paying special attention to the area where your surgery  will be performed.  7.  Thoroughly rinse your body with warm water from the neck down.  8.  DO NOT shower/wash with your normal soap after using and rinsing off  the CHG Soap.                9.  Pat yourself dry with a clean towel.            10.  Wear clean pajamas.            11.  Place clean sheets on your bed the night of your first shower and do not  sleep with pets. Day of Surgery : Do not apply any lotions/deodorants the morning of surgery.  Please wear clean clothes to the hospital/surgery center.  FAILURE TO FOLLOW THESE INSTRUCTIONS MAY RESULT IN THE CANCELLATION OF YOUR SURGERY PATIENT SIGNATURE_________________________________  NURSE SIGNATURE__________________________________  ________________________________________________________________________   Adam Phenix  An incentive spirometer is a tool that can help keep your lungs clear and active. This tool measures how well you are filling your lungs with each breath. Taking long deep breaths may help reverse or decrease the chance of developing breathing  (pulmonary) problems (especially infection) following:  A long period of time when you are unable to move or be active. BEFORE THE PROCEDURE   If the spirometer includes an indicator to show your best effort, your nurse or respiratory therapist will set it to a desired goal.  If possible, sit up straight or lean slightly forward. Try not to slouch.  Hold the incentive spirometer in an upright position. INSTRUCTIONS FOR USE   Sit on the edge of your bed if possible, or sit up as far as you can in bed or on a chair.  Hold the incentive spirometer in an upright position.  Breathe out normally.  Place the mouthpiece in your mouth and seal your lips tightly around it.  Breathe in slowly and as deeply as possible, raising the piston or the ball toward the top of the column.  Hold your breath for 3-5 seconds or for as long as possible. Allow the piston or ball to fall to the bottom of the column.  Remove the mouthpiece from your mouth and breathe out normally.  Rest for a few seconds and repeat Steps 1 through 7 at least 10 times every 1-2 hours when you are awake. Take your time and take a few normal breaths between deep breaths.  The spirometer may include an indicator to show your best effort. Use the indicator as a goal to work toward during each repetition.  After each set of 10 deep breaths, practice coughing to be sure your lungs are clear. If you have an incision (the cut made at the time of surgery), support your incision when coughing by placing a pillow or rolled up towels firmly against it. Once you are able to get out of bed, walk around indoors  and cough well. You may stop using the incentive spirometer when instructed by your caregiver.  RISKS AND COMPLICATIONS  Take your time so you do not get dizzy or light-headed.  If you are in pain, you may need to take or ask for pain medication before doing incentive spirometry. It is harder to take a deep breath if you are having  pain. AFTER USE  Rest and breathe slowly and easily.  It can be helpful to keep track of a log of your progress. Your caregiver can provide you with a simple table to help with this. If you are using the spirometer at home, follow these instructions: [place] IF:   You are having difficultly using the spirometer.  You have trouble using the spirometer as often as instructed.  Your pain medication is not giving enough relief while using the spirometer.  You develop fever of 100.5 F (38.1 C) or higher. SEEK IMMEDIATE MEDICAL CARE IF:   You cough up bloody sputum that had not been present before.  You develop fever of 102 F (38.9 C) or greater.  You develop worsening pain at or near the incision site. MAKE SURE YOU:   Understand these instructions.  Will watch your condition.  Will get help right away if you are not doing well or get worse. Document Released: 01/25/2007 Document Revised: 12/07/2011 Document Reviewed: 03/28/2007 ExitCare Patient Information 2014 ExitCare, [place].   ________________________________________________________________________  WHAT IS A BLOOD TRANSFUSION? Blood Transfusion Information  A transfusion is the replacement of blood or some of its parts. Blood is made up of multiple cells which provide different functions.  Red blood cells carry oxygen and are used for blood loss replacement.  White blood cells fight against infection.  Platelets control bleeding.  Plasma helps clot blood.  Other blood products are available for specialized needs, such as hemophilia or other clotting disorders. BEFORE THE TRANSFUSION  Who gives blood for transfusions?   Healthy volunteers who are fully evaluated to make sure their blood is safe. This is blood bank blood. Transfusion therapy is the safest it has ever been in the practice of medicine. Before blood is taken from a donor, a complete history is taken to make sure that person has no history  of diseases nor engages in risky social behavior (examples are intravenous drug use or sexual activity with multiple partners). The donor's travel history is screened to minimize risk of transmitting infections, such as malaria. The donated blood is tested for signs of infectious diseases, such as HIV and hepatitis. The blood is then tested to be sure it is compatible with you in order to minimize the chance of a transfusion reaction. If you or a relative donates blood, this is often done in anticipation of surgery and is not appropriate for emergency situations. It takes many days to process the donated blood. RISKS AND COMPLICATIONS Although transfusion therapy is very safe and saves many lives, the main dangers of transfusion include:   Getting an infectious disease.  Developing a transfusion reaction. This is an allergic reaction to something in the blood you were given. Every precaution is taken to prevent this. The decision to have a blood transfusion has been considered carefully by your caregiver before blood is given. Blood is not given unless the benefits outweigh the risks. AFTER THE TRANSFUSION  Right after receiving a blood transfusion, you will usually feel much better and more energetic. This is especially true if your red blood cells have gotten low (  anemic). The transfusion raises the level of the red blood cells which carry oxygen, and this usually causes an energy increase.  The nurse administering the transfusion will monitor you carefully for complications. HOME CARE INSTRUCTIONS  No special instructions are needed after a transfusion. You may find your energy is better. Speak with your caregiver about any limitations on activity for underlying diseases you may have. SEEK MEDICAL CARE IF:   Your condition is not improving after your transfusion.  You develop redness or irritation at the intravenous (IV) site. SEEK IMMEDIATE MEDICAL CARE IF:  Any of the following symptoms  occur over the next 12 hours:  Shaking chills.  You have a temperature by mouth above 102 F (38.9 C), not controlled by medicine.  Chest, back, or muscle pain.  People around you feel you are not acting correctly or are confused.  Shortness of breath or difficulty breathing.  Dizziness and fainting.  You get a rash or develop hives.  You have a decrease in urine output.  Your urine turns a dark color or changes to pink, red, or brown. Any of the following symptoms occur over the next 10 days:  You have a temperature by mouth above 102 F (38.9 C), not controlled by medicine.  Shortness of breath.  Weakness after normal activity.  The white part of the eye turns yellow (jaundice).  You have a decrease in the amount of urine or are urinating less often.  Your urine turns a dark color or changes to pink, red, or brown. Document Released: 09/11/2000 Document Revised: 12/07/2011 Document Reviewed: 04/30/2008 [facility] Patient Information 2014 [place], [place].  _______________________________________________________________________

## 2015-05-31 NOTE — Progress Notes (Signed)
Preoperative surgical orders have been place into the Epic hospital system for Austin Graham on 05/31/2015, 11:02 AM  by Mickel Crow for surgery on 06-12-2015.  Preop Total Hip - Anterior Approach orders including IV Tylenol, and IV Decadron as long as there are no contraindications to the above medications. [name], PA-C

## 2015-06-05 ENCOUNTER — Encounter (HOSPITAL_COMMUNITY): Payer: Self-pay

## 2015-06-05 ENCOUNTER — Encounter (HOSPITAL_COMMUNITY)
Admission: RE | Admit: 2015-06-05 | Discharge: 2015-06-05 | Disposition: A | Discharge status: Home / Self Care | Payer: BLUE CROSS/BLUE SHIELD | Source: Ambulatory Visit | Attending: Orthopedic Surgery | Admitting: Orthopedic Surgery

## 2015-06-05 DIAGNOSIS — Z01810 Encounter for preprocedural cardiovascular examination: Secondary | ICD-10-CM | POA: Insufficient documentation

## 2015-06-05 DIAGNOSIS — Z01812 Encounter for preprocedural laboratory examination: Secondary | ICD-10-CM | POA: Insufficient documentation

## 2015-06-05 DIAGNOSIS — M1611 Unilateral primary osteoarthritis, right hip: Secondary | ICD-10-CM | POA: Insufficient documentation

## 2015-06-05 HISTORY — DX: Personal history of other diseases of the digestive system: Z87.19

## 2015-06-05 HISTORY — DX: Adverse effect of unspecified anesthetic, initial encounter: T41.45XA

## 2015-06-05 HISTORY — DX: Cardiac arrhythmia, unspecified: I49.9

## 2015-06-05 HISTORY — DX: Other complications of anesthesia, initial encounter: T88.59XA

## 2015-06-05 HISTORY — DX: Nontoxic goiter, unspecified: E04.9

## 2015-06-05 HISTORY — DX: Unspecified osteoarthritis, unspecified site: M19.90

## 2015-06-05 LAB — CBC
HCT: 42.7 % (ref 39.0–52.0)
Hemoglobin: 14.4 g/dL (ref 13.0–17.0)
MCH: 31 pg (ref 26.0–34.0)
MCHC: 33.7 g/dL (ref 30.0–36.0)
MCV: 92 fL (ref 78.0–100.0)
PLATELETS: 241 10*3/uL (ref 150–400)
RBC: 4.64 MIL/uL (ref 4.22–5.81)
RDW: 13.3 % (ref 11.5–15.5)
WBC: 6.5 10*3/uL (ref 4.0–10.5)

## 2015-06-05 LAB — COMPREHENSIVE METABOLIC PANEL
ALT: 33 U/L (ref 17–63)
ANION GAP: 9 (ref 5–15)
AST: 24 U/L (ref 15–41)
Albumin: 5 g/dL (ref 3.5–5.0)
Alkaline Phosphatase: 78 U/L (ref 38–126)
BUN: 16 mg/dL (ref 6–20)
CALCIUM: 10 mg/dL (ref 8.9–10.3)
CHLORIDE: 102 mmol/L (ref 101–111)
CO2: 27 mmol/L (ref 22–32)
CREATININE: 1.02 mg/dL (ref 0.61–1.24)
Glucose, Bld: 109 mg/dL — ABNORMAL HIGH (ref 65–99)
Potassium: 4.5 mmol/L (ref 3.5–5.1)
SODIUM: 138 mmol/L (ref 135–145)
Total Bilirubin: 1.3 mg/dL — ABNORMAL HIGH (ref 0.3–1.2)
Total Protein: 7.7 g/dL (ref 6.5–8.1)

## 2015-06-05 LAB — APTT: aPTT: 28 seconds (ref 24–37)

## 2015-06-05 LAB — SURGICAL PCR SCREEN
MRSA, PCR: NEGATIVE
STAPHYLOCOCCUS AUREUS: NEGATIVE

## 2015-06-05 LAB — URINALYSIS, ROUTINE W REFLEX MICROSCOPIC
Bilirubin Urine: NEGATIVE
GLUCOSE, UA: 100 mg/dL — AB
HGB URINE DIPSTICK: NEGATIVE
Ketones, ur: NEGATIVE mg/dL
LEUKOCYTES UA: NEGATIVE
Nitrite: NEGATIVE
PH: 6.5 (ref 5.0–8.0)
Protein, ur: NEGATIVE mg/dL
Specific Gravity, Urine: 1.012 (ref 1.005–1.030)
Urobilinogen, UA: 0.2 mg/dL (ref 0.0–1.0)

## 2015-06-05 LAB — PROTIME-INR
INR: 0.99 (ref 0.00–1.49)
PROTHROMBIN TIME: 13.3 s (ref 11.6–15.2)

## 2015-06-05 LAB — ABO/RH: ABO/RH(D): O POS

## 2015-06-05 NOTE — Progress Notes (Signed)
Surgical clearance - Dr. Laurance Flatten - in chart 03-21-15 - LOV - Dr. Laurance Flatten (fam.med.) - EPIC 03-07-14 - 2V CXR - EPIC  12-19-12 - Exercise Tolerance Test - EPIC 11-02-12 - LOV - Dr. Percival Spanish (cardio) - EPIC 11-02-12 - EKG - EPIC

## 2015-06-11 NOTE — H&P (Signed)
Austin Graham DOB: 01/20/1951 Married / Language: English / Race: White Male Date of Admission:  06/12/2015 CC:  Right Hip Pain History of Present Illness  The patient is a 64 year old male who comes in for a preoperative History and Physical. The patient is scheduled for a right total hip arthroplasty (anterior) to be performed by Dr. Frank V. Aluisio, MD at [facility] on 06/12/2015. The patient is a 64 year old male who presented for follow up of their hip. The patient is being followed for their right hip pain and osteoarthritis. They are 6 month(s) out from intra-articular injection. Symptoms reported today include: pain and aching. The patient feels that they are doing poorly and report their pain level to be mild to moderate. The following medication has been used for pain control: antiinflammatory medication (Motrin). The patient has not gotten any relief of their symptoms with Cortisone injections (helped for 2-3 weeks). He states that the hip is getting progressively worse. Since we saw him last he now has increased pain and more functional limitations. Cortisone injection did help some but it did not last long. He is at a stage now where the hip has essentially taken over his life and is now ready to get the hip replaced. They have been treated conservatively in the past for the above stated problem and despite conservative measures, they continue to have progressive pain and severe functional limitations and dysfunction. They have failed non-operative management including home exercise, medications, and injections. It is felt that they would benefit from undergoing total joint replacement. Risks and benefits of the procedure have been discussed with the patient and they elect to proceed with surgery. There are no active contraindications to surgery such as ongoing infection or rapidly progressive neurological disease.  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  Family History Father - Cardiovascular Disease Mother - Cancer  Social History  Alcohol use Occasional alcohol use. Tobacco use Past Smoker Social Intake of Alcohol - 1 to 2 drinks per week. Living Will  Medication History  Fish Oil Active. MetFORMIN HCl ER (500MG Tablet ER 24HR, 3 tabs daily Oral) Active. Atorvastatin Calcium (80MG Tablet, Oral) Active. Benicar (20MG Tablet, Oral) Active. One-A-Day Mens (1 Oral) Active. Vitamin D (2000UNIT Tablet, 1 Oral) Active. Cinnamon (500MG Capsule, Oral) Active. Motrin (Oral) Specific dose unknown - Active.  Past Surgical History Kidney Stone Surgery  Review of Systems General Present- Night Sweats. Not Present- Chills, Fatigue, Fever, Memory Loss, Weight Gain and Weight Loss. Skin Not Present- Eczema, Hives, Itching, Lesions and Rash. HEENT Not Present- Dentures, Double Vision, Headache, Hearing Loss, Tinnitus and Visual Loss. Respiratory Present- Wheezing. Not Present- Allergies, Chronic Cough, Coughing up blood, Shortness of breath at rest and Shortness of breath with exertion. Cardiovascular Not Present- Chest Pain, Difficulty Breathing Lying Down, Murmur, Palpitations, Racing/skipping heartbeats and Swelling. Gastrointestinal Present- Constipation and Heartburn. Not Present- Abdominal Pain, Bloody Stool, Diarrhea, Difficulty Swallowing, Jaundice, Loss of appetitie, Nausea and Vomiting. Male Genitourinary Present- Urinary frequency, Urinating at Night and  Weak urinary stream. Not Present- Blood in Urine, Discharge, Flank Pain, Incontinence, Painful Urination, Urgency and Urinary Retention. Musculoskeletal Present- Back Pain, Joint Pain, Morning   Stiffness and Spasms. Not Present- Joint Swelling, Muscle Pain and Muscle Weakness. Neurological Not Present- Blackout spells, Difficulty with balance, Dizziness, Paralysis, Tremor and Weakness. Psychiatric Not Present- Insomnia.  Vitals Weight: 196 lb Height: 72in Weight was reported by patient. Height was reported by patient. Body Surface Area: 2.11 m Body Mass Index: 26.58 kg/m  BP: 142/92 (Sitting, Left Arm, Standard)  Physical Exam  General Mental Status -Alert, cooperative and good historian. General Appearance-pleasant, Not in acute distress. Orientation-Oriented X3. Build & Nutrition-Well nourished and Well developed.  Head and Neck Head-normocephalic, atraumatic . Neck Global Assessment - supple, no bruit auscultated on the right, no bruit auscultated on the left.  Eye Pupil - Bilateral-Regular and Round. Motion - Bilateral-EOMI.  Chest and Lung Exam Auscultation Breath sounds - clear at anterior chest wall and clear at posterior chest wall. Adventitious sounds - No Adventitious sounds.  Cardiovascular Auscultation Rhythm - Regular rate and rhythm. Heart Sounds - S1 WNL and S2 WNL. Murmurs & Other Heart Sounds - Auscultation of the heart reveals - No Murmurs.  Abdomen Palpation/Percussion Tenderness - Abdomen is non-tender to palpation. Rigidity (guarding) - Abdomen is soft. Auscultation Auscultation of the abdomen reveals - Bowel sounds normal.  Male Genitourinary Note: Not done, not pertinent to present illness  Musculoskeletal Note: He is alert and oriented, no apparent distress. Right hip can be flexed to about 90, minimal internal rotation, about 20 external rotation, 20 abduction. His left hip has normal range of motion without  discomfort.  RADIOGRAPHS: AP pelvis, AP and lateral of the right hip show that he is just about bone on bone in that right hip now. He has some osteophyte formation, subchondral cystic change. This has definitely progressed since his last x-ray.  Assessment & Plan Primary osteoarthritis of right hip (M16.11) Note:Surgical Plans: Right Total Hip Replacement - Anterior Approach  Disposition: Home  PCP: Dr. Don Moore  IV TXA  Anesthesia Issues: None  Signed electronically by Alexzandrew L Perkins, III PA-C 

## 2015-06-12 ENCOUNTER — Inpatient Hospital Stay (HOSPITAL_COMMUNITY): Payer: BLUE CROSS/BLUE SHIELD | Admitting: Anesthesiology

## 2015-06-12 ENCOUNTER — Inpatient Hospital Stay (HOSPITAL_COMMUNITY): Payer: BLUE CROSS/BLUE SHIELD

## 2015-06-12 ENCOUNTER — Encounter (HOSPITAL_COMMUNITY)
Admission: RE | Disposition: A | Discharge status: Home Health | Payer: Self-pay | Source: Ambulatory Visit | Attending: Orthopedic Surgery

## 2015-06-12 ENCOUNTER — Encounter (HOSPITAL_COMMUNITY): Payer: Self-pay | Admitting: Anesthesiology

## 2015-06-12 ENCOUNTER — Inpatient Hospital Stay (HOSPITAL_COMMUNITY)
Admission: RE | Admit: 2015-06-12 | Discharge: 2015-06-13 | DRG: 470 | Disposition: A | Discharge status: Home Health | Payer: BLUE CROSS/BLUE SHIELD | Source: Ambulatory Visit | Attending: Orthopedic Surgery | Admitting: Orthopedic Surgery

## 2015-06-12 DIAGNOSIS — J45909 Unspecified asthma, uncomplicated: Secondary | ICD-10-CM | POA: Diagnosis present

## 2015-06-12 DIAGNOSIS — I2510 Atherosclerotic heart disease of native coronary artery without angina pectoris: Secondary | ICD-10-CM | POA: Diagnosis present

## 2015-06-12 DIAGNOSIS — Z87442 Personal history of urinary calculi: Secondary | ICD-10-CM | POA: Diagnosis not present

## 2015-06-12 DIAGNOSIS — I10 Essential (primary) hypertension: Secondary | ICD-10-CM | POA: Diagnosis present

## 2015-06-12 DIAGNOSIS — M1611 Unilateral primary osteoarthritis, right hip: Principal | ICD-10-CM | POA: Diagnosis present

## 2015-06-12 DIAGNOSIS — M25551 Pain in right hip: Secondary | ICD-10-CM | POA: Diagnosis present

## 2015-06-12 DIAGNOSIS — K219 Gastro-esophageal reflux disease without esophagitis: Secondary | ICD-10-CM | POA: Diagnosis present

## 2015-06-12 DIAGNOSIS — Z79899 Other long term (current) drug therapy: Secondary | ICD-10-CM | POA: Diagnosis not present

## 2015-06-12 DIAGNOSIS — E780 Pure hypercholesterolemia: Secondary | ICD-10-CM | POA: Diagnosis present

## 2015-06-12 DIAGNOSIS — E119 Type 2 diabetes mellitus without complications: Secondary | ICD-10-CM | POA: Diagnosis present

## 2015-06-12 DIAGNOSIS — E785 Hyperlipidemia, unspecified: Secondary | ICD-10-CM | POA: Diagnosis present

## 2015-06-12 DIAGNOSIS — M169 Osteoarthritis of hip, unspecified: Secondary | ICD-10-CM | POA: Diagnosis present

## 2015-06-12 DIAGNOSIS — Z96649 Presence of unspecified artificial hip joint: Secondary | ICD-10-CM

## 2015-06-12 DIAGNOSIS — Z01812 Encounter for preprocedural laboratory examination: Secondary | ICD-10-CM | POA: Diagnosis not present

## 2015-06-12 HISTORY — PX: TOTAL HIP ARTHROPLASTY: SHX124

## 2015-06-12 LAB — GLUCOSE, CAPILLARY
GLUCOSE-CAPILLARY: 250 mg/dL — AB (ref 65–99)
Glucose-Capillary: 143 mg/dL — ABNORMAL HIGH (ref 65–99)
Glucose-Capillary: 148 mg/dL — ABNORMAL HIGH (ref 65–99)
Glucose-Capillary: 165 mg/dL — ABNORMAL HIGH (ref 65–99)

## 2015-06-12 LAB — TYPE AND SCREEN
ABO/RH(D): O POS
Antibody Screen: NEGATIVE

## 2015-06-12 SURGERY — ARTHROPLASTY, HIP, TOTAL, ANTERIOR APPROACH
Anesthesia: Spinal | Site: Hip | Laterality: Right

## 2015-06-12 MED ORDER — DOCUSATE SODIUM 100 MG PO CAPS
100.0000 mg | ORAL_CAPSULE | Freq: Two times a day (BID) | ORAL | Status: DC
  Administered 2015-06-12 – 2015-06-13 (×2): 100 mg via ORAL

## 2015-06-12 MED ORDER — 0.9 % SODIUM CHLORIDE (POUR BTL) OPTIME
TOPICAL | Status: DC | PRN
  Administered 2015-06-12: 1000 mL

## 2015-06-12 MED ORDER — POLYETHYLENE GLYCOL 3350 17 G PO PACK: 17.0000 g | PACK | Freq: Every day | ORAL | Status: DC | PRN

## 2015-06-12 MED ORDER — FENTANYL CITRATE (PF) 100 MCG/2ML IJ SOLN
INTRAMUSCULAR | Status: AC
  Filled 2015-06-12: qty 4

## 2015-06-12 MED ORDER — PHENOL 1.4 % MT LIQD: 1.0000 | OROMUCOSAL | Status: DC | PRN

## 2015-06-12 MED ORDER — BUPIVACAINE HCL (PF) 0.25 % IJ SOLN
INTRAMUSCULAR | Status: AC
  Filled 2015-06-12: qty 30

## 2015-06-12 MED ORDER — FENTANYL CITRATE (PF) 100 MCG/2ML IJ SOLN
INTRAMUSCULAR | Status: DC | PRN
  Administered 2015-06-12: 50 ug via INTRAVENOUS

## 2015-06-12 MED ORDER — ONDANSETRON HCL 4 MG/2ML IJ SOLN: 4.0000 mg | Freq: Once | INTRAMUSCULAR | Status: DC | PRN

## 2015-06-12 MED ORDER — DIPHENHYDRAMINE HCL 12.5 MG/5ML PO ELIX: 12.5000 mg | ORAL_SOLUTION | ORAL | Status: DC | PRN

## 2015-06-12 MED ORDER — ATORVASTATIN CALCIUM 40 MG PO TABS
40.0000 mg | ORAL_TABLET | Freq: Every day | ORAL | Status: DC
  Administered 2015-06-12: 40 mg via ORAL
  Filled 2015-06-12 (×2): qty 1

## 2015-06-12 MED ORDER — HYDROMORPHONE HCL 1 MG/ML IJ SOLN: 0.2500 mg | INTRAMUSCULAR | Status: DC | PRN

## 2015-06-12 MED ORDER — ONDANSETRON HCL 4 MG/2ML IJ SOLN: 4.0000 mg | Freq: Four times a day (QID) | INTRAMUSCULAR | Status: DC | PRN

## 2015-06-12 MED ORDER — IRBESARTAN 75 MG PO TABS
75.0000 mg | ORAL_TABLET | Freq: Every day | ORAL | Status: DC
  Administered 2015-06-13: 75 mg via ORAL
  Filled 2015-06-12: qty 1

## 2015-06-12 MED ORDER — CEFAZOLIN SODIUM-DEXTROSE 2-3 GM-% IV SOLR
2.0000 g | Freq: Four times a day (QID) | INTRAVENOUS | Status: AC
Start: 2015-06-12 — End: 2015-06-12
  Administered 2015-06-12 (×2): 2 g via INTRAVENOUS
  Filled 2015-06-12 (×2): qty 50

## 2015-06-12 MED ORDER — METHOCARBAMOL 1000 MG/10ML IJ SOLN
500.0000 mg | Freq: Four times a day (QID) | INTRAVENOUS | Status: DC | PRN
  Filled 2015-06-12: qty 5

## 2015-06-12 MED ORDER — PROPOFOL 10 MG/ML IV BOLUS
INTRAVENOUS | Status: AC
  Filled 2015-06-12: qty 20

## 2015-06-12 MED ORDER — FLEET ENEMA 7-19 GM/118ML RE ENEM: 1.0000 | ENEMA | Freq: Once | RECTAL | Status: DC | PRN

## 2015-06-12 MED ORDER — MEPERIDINE HCL 50 MG/ML IJ SOLN: 6.2500 mg | INTRAMUSCULAR | Status: DC | PRN

## 2015-06-12 MED ORDER — ACETAMINOPHEN 650 MG RE SUPP: 650.0000 mg | Freq: Four times a day (QID) | RECTAL | Status: DC | PRN

## 2015-06-12 MED ORDER — MORPHINE SULFATE (PF) 2 MG/ML IV SOLN: 1.0000 mg | INTRAVENOUS | Status: DC | PRN

## 2015-06-12 MED ORDER — KETOROLAC TROMETHAMINE 15 MG/ML IJ SOLN
7.5000 mg | Freq: Four times a day (QID) | INTRAMUSCULAR | Status: AC | PRN
  Administered 2015-06-12: 7.5 mg via INTRAVENOUS
  Filled 2015-06-12: qty 1

## 2015-06-12 MED ORDER — PROPOFOL 10 MG/ML IV BOLUS
INTRAVENOUS | Status: DC | PRN
  Administered 2015-06-12: 20 mg via INTRAVENOUS
  Administered 2015-06-12: 30 mg via INTRAVENOUS

## 2015-06-12 MED ORDER — ONDANSETRON HCL 4 MG/2ML IJ SOLN
INTRAMUSCULAR | Status: AC
  Filled 2015-06-12: qty 2

## 2015-06-12 MED ORDER — BUPIVACAINE HCL (PF) 0.5 % IJ SOLN
INTRAMUSCULAR | Status: DC | PRN
  Administered 2015-06-12: 3 mL via INTRATHECAL

## 2015-06-12 MED ORDER — MIDAZOLAM HCL 5 MG/5ML IJ SOLN
INTRAMUSCULAR | Status: DC | PRN
  Administered 2015-06-12: 2 mg via INTRAVENOUS

## 2015-06-12 MED ORDER — LACTATED RINGERS IV SOLN
INTRAVENOUS | Status: DC | PRN
  Administered 2015-06-12 (×2): via INTRAVENOUS

## 2015-06-12 MED ORDER — INSULIN ASPART 100 UNIT/ML ~~LOC~~ SOLN
0.0000 [IU] | Freq: Three times a day (TID) | SUBCUTANEOUS | Status: DC
  Administered 2015-06-12: 5 [IU] via SUBCUTANEOUS

## 2015-06-12 MED ORDER — PROPOFOL INFUSION 10 MG/ML OPTIME
INTRAVENOUS | Status: DC | PRN
  Administered 2015-06-12: 50 ug/kg/min via INTRAVENOUS

## 2015-06-12 MED ORDER — CEFAZOLIN SODIUM-DEXTROSE 2-3 GM-% IV SOLR
INTRAVENOUS | Status: AC
  Filled 2015-06-12: qty 50

## 2015-06-12 MED ORDER — SODIUM CHLORIDE 0.9 % IV SOLN
INTRAVENOUS | Status: DC
  Administered 2015-06-12: via INTRAVENOUS

## 2015-06-12 MED ORDER — CEFAZOLIN SODIUM-DEXTROSE 2-3 GM-% IV SOLR
2.0000 g | INTRAVENOUS | Status: AC
  Administered 2015-06-12: 2 g via INTRAVENOUS

## 2015-06-12 MED ORDER — BISACODYL 10 MG RE SUPP: 10.0000 mg | Freq: Every day | RECTAL | Status: DC | PRN

## 2015-06-12 MED ORDER — ACETAMINOPHEN 325 MG PO TABS: 650.0000 mg | ORAL_TABLET | Freq: Four times a day (QID) | ORAL | Status: DC | PRN

## 2015-06-12 MED ORDER — MENTHOL 3 MG MT LOZG: 1.0000 | LOZENGE | OROMUCOSAL | Status: DC | PRN

## 2015-06-12 MED ORDER — TRANEXAMIC ACID 1000 MG/10ML IV SOLN
1000.0000 mg | INTRAVENOUS | Status: AC
  Administered 2015-06-12: 1000 mg via INTRAVENOUS
  Filled 2015-06-12 (×2): qty 10

## 2015-06-12 MED ORDER — SODIUM CHLORIDE 0.9 % IV SOLN
INTRAVENOUS | Status: DC
  Administered 2015-06-12: 13:00:00 via INTRAVENOUS

## 2015-06-12 MED ORDER — PHENYLEPHRINE HCL 10 MG/ML IJ SOLN
10.0000 mg | INTRAVENOUS | Status: DC | PRN
  Administered 2015-06-12: 40 ug/min via INTRAVENOUS

## 2015-06-12 MED ORDER — MIDAZOLAM HCL 2 MG/2ML IJ SOLN
INTRAMUSCULAR | Status: AC
  Filled 2015-06-12: qty 4

## 2015-06-12 MED ORDER — METOCLOPRAMIDE HCL 5 MG/ML IJ SOLN: 5.0000 mg | Freq: Three times a day (TID) | INTRAMUSCULAR | Status: DC | PRN

## 2015-06-12 MED ORDER — ACETAMINOPHEN 500 MG PO TABS
1000.0000 mg | ORAL_TABLET | Freq: Four times a day (QID) | ORAL | Status: AC
  Administered 2015-06-12 – 2015-06-13 (×4): 1000 mg via ORAL
  Filled 2015-06-12 (×4): qty 2

## 2015-06-12 MED ORDER — METOCLOPRAMIDE HCL 10 MG PO TABS: 5.0000 mg | ORAL_TABLET | Freq: Three times a day (TID) | ORAL | Status: DC | PRN

## 2015-06-12 MED ORDER — BUPIVACAINE HCL (PF) 0.5 % IJ SOLN
INTRAMUSCULAR | Status: AC
  Filled 2015-06-12: qty 30

## 2015-06-12 MED ORDER — CHLORHEXIDINE GLUCONATE 4 % EX LIQD: 60.0000 mL | Freq: Once | CUTANEOUS | Status: DC

## 2015-06-12 MED ORDER — DEXAMETHASONE SODIUM PHOSPHATE 10 MG/ML IJ SOLN
10.0000 mg | Freq: Once | INTRAMUSCULAR | Status: AC
  Administered 2015-06-13: 10 mg via INTRAVENOUS
  Filled 2015-06-12: qty 1

## 2015-06-12 MED ORDER — METHOCARBAMOL 500 MG PO TABS: 500.0000 mg | ORAL_TABLET | Freq: Four times a day (QID) | ORAL | Status: DC | PRN

## 2015-06-12 MED ORDER — BUPIVACAINE HCL (PF) 0.25 % IJ SOLN
INTRAMUSCULAR | Status: DC | PRN
  Administered 2015-06-12: 30 mL

## 2015-06-12 MED ORDER — DEXAMETHASONE SODIUM PHOSPHATE 10 MG/ML IJ SOLN
10.0000 mg | Freq: Once | INTRAMUSCULAR | Status: AC
  Administered 2015-06-12: 10 mg via INTRAVENOUS

## 2015-06-12 MED ORDER — ACETAMINOPHEN 10 MG/ML IV SOLN
INTRAVENOUS | Status: AC
  Filled 2015-06-12: qty 100

## 2015-06-12 MED ORDER — DEXAMETHASONE SODIUM PHOSPHATE 10 MG/ML IJ SOLN
INTRAMUSCULAR | Status: AC
  Filled 2015-06-12: qty 1

## 2015-06-12 MED ORDER — RIVAROXABAN 10 MG PO TABS
10.0000 mg | ORAL_TABLET | Freq: Every day | ORAL | Status: DC
  Administered 2015-06-13: 10 mg via ORAL
  Filled 2015-06-12 (×2): qty 1

## 2015-06-12 MED ORDER — ONDANSETRON HCL 4 MG PO TABS: 4.0000 mg | ORAL_TABLET | Freq: Four times a day (QID) | ORAL | Status: DC | PRN

## 2015-06-12 MED ORDER — TRAMADOL HCL 50 MG PO TABS: 50.0000 mg | ORAL_TABLET | Freq: Four times a day (QID) | ORAL | Status: DC | PRN

## 2015-06-12 MED ORDER — ONDANSETRON HCL 4 MG/2ML IJ SOLN
INTRAMUSCULAR | Status: DC | PRN
Start: 2015-06-12 — End: 2015-06-12
  Administered 2015-06-12: 4 mg via INTRAVENOUS

## 2015-06-12 MED ORDER — OXYCODONE HCL 5 MG PO TABS
5.0000 mg | ORAL_TABLET | ORAL | Status: DC | PRN
  Administered 2015-06-12: 5 mg via ORAL
  Administered 2015-06-12: 10 mg via ORAL
  Administered 2015-06-12 (×2): 5 mg via ORAL
  Administered 2015-06-13 (×2): 10 mg via ORAL
  Administered 2015-06-13: 5 mg via ORAL
  Filled 2015-06-12: qty 2
  Filled 2015-06-12 (×2): qty 1
  Filled 2015-06-12 (×3): qty 2
  Filled 2015-06-12 (×2): qty 1

## 2015-06-12 MED ORDER — METFORMIN HCL ER 500 MG PO TB24
500.0000 mg | ORAL_TABLET | Freq: Three times a day (TID) | ORAL | Status: DC
  Administered 2015-06-13 (×2): 500 mg via ORAL
  Filled 2015-06-12 (×5): qty 1

## 2015-06-12 MED ORDER — ACETAMINOPHEN 10 MG/ML IV SOLN
1000.0000 mg | Freq: Once | INTRAVENOUS | Status: AC
  Administered 2015-06-12: 1000 mg via INTRAVENOUS
  Filled 2015-06-12: qty 100

## 2015-06-12 MED ORDER — LACTATED RINGERS IV SOLN: INTRAVENOUS | Status: DC

## 2015-06-12 SURGICAL SUPPLY — 46 items
BAG DECANTER FOR FLEXI CONT (MISCELLANEOUS) ×3 IMPLANT
BAG SPEC THK2 15X12 ZIP CLS (MISCELLANEOUS)
BAG ZIPLOCK 12X15 (MISCELLANEOUS) IMPLANT
BLADE EXTENDED COATED 6.5IN (ELECTRODE) ×3 IMPLANT
BLADE SAG 18X100X1.27 (BLADE) ×3 IMPLANT
CAPT HIP TOTAL 2 ×2 IMPLANT
CATH FOLEY LATEX FREE 16FR (CATHETERS) ×2 IMPLANT
CLOSURE WOUND 1/2 X4 (GAUZE/BANDAGES/DRESSINGS) ×1
COVER PERINEAL POST (MISCELLANEOUS) ×3 IMPLANT
DECANTER SPIKE VIAL GLASS SM (MISCELLANEOUS) ×3 IMPLANT
DRAPE C-ARM 42X120 X-RAY (DRAPES) ×3 IMPLANT
DRAPE STERI IOBAN 125X83 (DRAPES) ×3 IMPLANT
DRAPE U-SHAPE 47X51 STRL (DRAPES) ×9 IMPLANT
DRSG ADAPTIC 3X8 NADH LF (GAUZE/BANDAGES/DRESSINGS) ×1 IMPLANT
DRSG MEPILEX BORDER 4X4 (GAUZE/BANDAGES/DRESSINGS) ×3 IMPLANT
DRSG MEPILEX BORDER 4X8 (GAUZE/BANDAGES/DRESSINGS) ×3 IMPLANT
DURAPREP 26ML APPLICATOR (WOUND CARE) ×3 IMPLANT
ELECT REM PT RETURN 9FT ADLT (ELECTROSURGICAL) ×3
ELECTRODE REM PT RTRN 9FT ADLT (ELECTROSURGICAL) ×1 IMPLANT
EVACUATOR 1/8 PVC DRAIN (DRAIN) ×3 IMPLANT
FACESHIELD WRAPAROUND (MASK) ×12 IMPLANT
FACESHIELD WRAPAROUND OR TEAM (MASK) ×4 IMPLANT
GLOVE BIO SURGEON STRL SZ7.5 (GLOVE) ×3 IMPLANT
GLOVE BIO SURGEON STRL SZ8 (GLOVE) ×2 IMPLANT
GLOVE BIOGEL PI IND STRL 8 (GLOVE) ×2 IMPLANT
GLOVE BIOGEL PI INDICATOR 8 (GLOVE)
GOWN STRL REUS W/TWL LRG LVL3 (GOWN DISPOSABLE) ×3 IMPLANT
GOWN STRL REUS W/TWL XL LVL3 (GOWN DISPOSABLE) ×3 IMPLANT
KIT BASIN OR (CUSTOM PROCEDURE TRAY) ×3 IMPLANT
NDL SAFETY ECLIPSE 18X1.5 (NEEDLE) ×1 IMPLANT
NEEDLE HYPO 18GX1.5 SHARP (NEEDLE) ×3
PACK TOTAL JOINT (CUSTOM PROCEDURE TRAY) ×3 IMPLANT
PEN SKIN MARKING BROAD (MISCELLANEOUS) ×3 IMPLANT
STRIP CLOSURE SKIN 1/2X4 (GAUZE/BANDAGES/DRESSINGS) ×2 IMPLANT
SUT ETHIBOND NAB CT1 #1 30IN (SUTURE) ×3 IMPLANT
SUT MNCRL AB 4-0 PS2 18 (SUTURE) ×3 IMPLANT
SUT VIC AB 2-0 CT1 27 (SUTURE) ×6
SUT VIC AB 2-0 CT1 TAPERPNT 27 (SUTURE) ×2 IMPLANT
SUT VLOC 180 0 24IN GS25 (SUTURE) ×3 IMPLANT
SYR 30ML LL (SYRINGE) ×3 IMPLANT
SYR 50ML LL SCALE MARK (SYRINGE) IMPLANT
TOWEL OR 17X26 10 PK STRL BLUE (TOWEL DISPOSABLE) ×3 IMPLANT
TOWEL OR NON WOVEN STRL DISP B (DISPOSABLE) ×2 IMPLANT
TRAY FOLEY W/METER SILVER 14FR (SET/KITS/TRAYS/PACK) ×1 IMPLANT
TRAY FOLEY W/METER SILVER 16FR (SET/KITS/TRAYS/PACK) ×1 IMPLANT
YANKAUER SUCT BULB TIP 10FT TU (MISCELLANEOUS) ×2 IMPLANT

## 2015-06-12 NOTE — Evaluation (Signed)
Physical Therapy Evaluation Patient Details Name: Austin Graham MRN: [id number] DOB: [birth year] Today's Date: 06/12/2015   History of Present Illness  R THR   Clinical Impression  Pt s/p R THR presents with decreased R LE strength/ROM and post op pain limiting functional mobility.  Pt should progress well to dc home with family assist and HHPT follow up.    Follow Up Recommendations Home health PT    Equipment Recommendations  Rolling walker with 5" wheels    Recommendations for Other Services OT consult     Precautions / Restrictions Precautions Precautions: Fall Restrictions Weight Bearing Restrictions: No Other Position/Activity Restrictions: WBAT      Mobility  Bed Mobility Overal bed mobility: Needs Assistance Bed Mobility: Supine to Sit     Supine to sit: Min assist;Mod assist     General bed mobility comments: cues for sequence and use of L LE to self assist  Transfers Overall transfer level: Needs assistance Equipment used: Rolling walker (2 wheeled) Transfers: Sit to/from Stand Sit to Stand: Min assist         General transfer comment: cues for LE management and use of UEs to self assist  Ambulation/Gait Ambulation/Gait assistance: Min assist Ambulation Distance (Feet): 75 Feet Assistive device: Rolling walker (2 wheeled) Gait Pattern/deviations: Step-to pattern;Decreased step length - right;Decreased step length - left;Shuffle;Trunk flexed Gait velocity: decr   General Gait Details: cues for sequence, posture and position from RW;   Stairs            Wheelchair Mobility    Modified Rankin (Stroke Patients Only)       Balance                                             Pertinent Vitals/Pain Pain Assessment: 0-10 Pain Score: 2  Pain Location: R hip Pain Descriptors / Indicators: Aching;Sore Pain Intervention(s): Limited activity within patient's tolerance;Monitored during session;Premedicated before  session;Ice applied    Home Living Family/patient expects to be discharged to:: Private residence Living Arrangements: Spouse/significant other Available Help at Discharge: Family Type of Home: House Home Access: Stairs to enter Entrance Stairs-Rails: None Technical brewer of Steps: 3 Home Layout: One level Home Equipment: Environmental consultant - standard      Prior Function Level of Independence: Independent               Hand Dominance        Extremity/Trunk Assessment   Upper Extremity Assessment: Overall WFL for tasks assessed           Lower Extremity Assessment: Overall WFL for tasks assessed      Cervical / Trunk Assessment: Normal  Communication   Communication: No difficulties  Cognition Arousal/Alertness: Awake/alert Behavior During Therapy: WFL for tasks assessed/performed Overall Cognitive Status: Within Functional Limits for tasks assessed                      General Comments      Exercises        Assessment/Plan    PT Assessment Patient needs continued PT services  PT Diagnosis Difficulty walking   PT Problem List Decreased strength;Decreased range of motion;Decreased activity tolerance;Decreased mobility;Decreased knowledge of use of DME;Pain  PT Treatment Interventions DME instruction;Gait training;Stair training;Functional mobility training;Therapeutic activities;Therapeutic exercise;Patient/family education   PT Goals (Current goals can be found in the Care Plan section) Acute  Rehab PT Goals Patient Stated Goal: Resume previous lifestyle with decreased pain PT Goal Formulation: With patient Time For Goal Achievement: 06/15/15 Potential to Achieve Goals: Good    Frequency 7X/week   Barriers to discharge        Co-evaluation               End of Session Equipment Utilized During Treatment: Gait belt Activity Tolerance: Patient tolerated treatment well Patient left: in chair;with call bell/phone within reach;with  family/visitor present Nurse Communication: Mobility status         Time: 1550-1620 PT Time Calculation (min) (ACUTE ONLY): 30 min   Charges:   PT Evaluation $Initial PT Evaluation Tier I: 1 Procedure PT Treatments $Gait Training: 8-22 mins   PT G Codes:        Rosalva Neary 07/06/15, 4:56 PM

## 2015-06-12 NOTE — Anesthesia Procedure Notes (Signed)
Spinal Patient location during procedure: OR Start time: 06/12/2015 8:36 AM End time: 06/12/2015 8:42 AM Reason for block: at surgeon's request Staffing Resident/CRNA: Anne Fu Performed by: resident/CRNA  Preanesthetic Checklist Completed: patient identified, site marked, surgical consent, pre-op evaluation, timeout performed, IV checked, risks and benefits discussed, monitors and equipment checked and at surgeon's request Spinal Block Patient position: sitting Approach: right paramedian Location: L2-3 Injection technique: single-shot Needle Needle type: [place]  Needle gauge: 25 G Needle length: 9 cm Assessment Sensory level: T6 Additional Notes Expiration date of kit checked and confirmed. Patient tolerated procedure well, without complications. X 1 attempt with noted clear CSF return. Loss of motor and sensory on exam post injection.

## 2015-06-12 NOTE — Transfer of Care (Signed)
Immediate Anesthesia Transfer of Care Note  Patient: Austin Graham  Procedure(s) Performed: Procedure(s): RIGHT TOTAL HIP ARTHROPLASTY ANTERIOR APPROACH (Right)  Patient Location: PACU  Anesthesia Type:Spinal  Level of Consciousness:  sedated, patient cooperative and responds to stimulation  Airway & Oxygen Therapy:Patient Spontanous Breathing and Patient connected to face mask oxgen  Post-op Assessment:  Report given to PACU RN and Post -op Vital signs reviewed and stable  Post vital signs:  Reviewed and stable  Last Vitals:  Filed Vitals:   06/12/15 0637  BP: 151/94  Pulse: 88  Temp: 36.7 C  Resp: 18    Complications: No apparent anesthesia complications. T-12 on release to staff denied pain.

## 2015-06-12 NOTE — Anesthesia Postprocedure Evaluation (Signed)
Anesthesia Post Note  Patient: Austin Graham  Procedure(s) Performed: Procedure(s) (LRB): RIGHT TOTAL HIP ARTHROPLASTY ANTERIOR APPROACH (Right)  Anesthesia type: general  Patient location: PACU  Post pain: Pain level controlled  Post assessment: Patient's Cardiovascular Status Stable  Last Vitals:  Filed Vitals:   06/12/15 1640  BP: 136/69  Pulse: 78  Temp: 36.8 C  Resp: 16    Post vital signs: Reviewed and stable  Level of consciousness: sedated  Complications: No apparent anesthesia complications

## 2015-06-12 NOTE — Op Note (Signed)
OPERATIVE REPORT  PREOPERATIVE DIAGNOSIS: Osteoarthritis of the Right hip.   POSTOPERATIVE DIAGNOSIS: Osteoarthritis of the Right  hip.   PROCEDURE: Right total hip arthroplasty, anterior approach.   SURGEON: Gaynelle Arabian, MD   ASSISTANT: [name], PA-C  ANESTHESIA:  Spinal  ESTIMATED BLOOD LOSS:-550 ml  DRAINS: Hemovac x1.   COMPLICATIONS: None   CONDITION: PACU - hemodynamically stable.   BRIEF CLINICAL NOTE: Austin Graham is a 64 y.o. male who has advanced end-  stage arthritis of his Right  hip with progressively worsening pain and  dysfunction.The patient has failed nonoperative management and presents for  total hip arthroplasty.   PROCEDURE IN DETAIL: After successful administration of spinal  anesthetic, the traction boots for the [facility] bed were placed on both  feet and the patient was placed onto the [facility] bed, boots placed into the leg  holders. The Right hip was then isolated from the perineum with plastic  drapes and prepped and draped in the usual sterile fashion. ASIS and  greater trochanter were marked and a oblique incision was made, starting  at about 1 cm lateral and 2 cm distal to the ASIS and coursing towards  the anterior cortex of the femur. The skin was cut with a 10 blade  through subcutaneous tissue to the level of the fascia overlying the  tensor fascia lata muscle. The fascia was then incised in line with the  incision at the junction of the anterior third and posterior 2/3rd. The  muscle was teased off the fascia and then the interval between the TFL  and the rectus was developed. The Hohmann retractor was then placed at  the top of the femoral neck over the capsule. The vessels overlying the  capsule were cauterized and the fat on top of the capsule was removed.  A Hohmann retractor was then placed anterior underneath the rectus  femoris to give exposure to the entire anterior capsule. A T-shaped  capsulotomy was performed.  The edges were tagged and the femoral head  was identified.       Osteophytes are removed off the superior acetabulum.  The femoral neck was then cut in situ with an oscillating saw. Traction  was then applied to the left lower extremity utilizing the [facility]  traction. The femoral head was then removed. Retractors were placed  around the acetabulum and then circumferential removal of the labrum was  performed. Osteophytes were also removed. Reaming starts at 47 mm to  medialize and  Increased in 2 mm increments to 53 mm. We reamed in  approximately 40 degrees of abduction, 20 degrees anteversion. A 54 mm  pinnacle acetabular shell was then impacted in anatomic position under  fluoroscopic guidance with excellent purchase. We did not need to place  any additional dome screws. A 36 mm neutral + 4 marathon liner was then  placed into the acetabular shell.       The femoral lift was then placed along the lateral aspect of the femur  just distal to the vastus ridge. The leg was  externally rotated and capsule  was stripped off the inferior aspect of the femoral neck down to the  level of the lesser trochanter, this was done with electrocautery. The femur was lifted after this was performed. The  leg was then placed and extended in adducted position to essentially delivering the femur. We also removed the capsule superiorly and the  piriformis from the piriformis fossa to  gain excellent exposure of the  proximal femur. Rongeur was used to remove some cancellous bone to get  into the lateral portion of the proximal femur for placement of the  initial starter reamer. The starter broaches was placed  the starter broach  and was shown to go down the center of the canal. Broaching  with the  Corail system was then performed starting at size 8, coursing  Up to size 14. A size 14 had excellent torsional and rotational  and axial stability. The trial standard offset neck was then placed  with a 36 + 1.5  trial head. The hip was then reduced. We confirmed that  the stem was in the canal both on AP and lateral x-rays. It also has excellent sizing. The hip was reduced with outstanding stability through full extension, full external rotation,  and then flexion in adduction internal rotation. AP pelvis was taken  and the leg lengths were measured and found to be exactly equal. Hip  was then dislocated again and the femoral head and neck removed. The  femoral broach was removed. Size 14 Corail stem with a standard offset  neck was then impacted into the femur following native anteversion. Has  excellent purchase in the canal. Excellent torsional and rotational and  axial stability. It is confirmed to be in the canal on AP and lateral  fluoroscopic views. The 36 + 1.5 ceramic head was placed and the hip  reduced with outstanding stability. Again AP pelvis was taken and it  confirmed that the leg lengths were equal. The wound was then copiously  irrigated with saline solution and the capsule reattached and repaired  with Ethibond suture.  20 mL of Exparel mixed with 50 mL of saline then additional 20 ml of .25% Bupivicaine injected into the capsule and into the edge of the tensor fascia lata as well as subcutaneous tissue. The fascia overlying the tensor fascia lata was  then closed with a running #1 V-Loc. Subcu was closed with interrupted  2-0 Vicryl and subcuticular running 4-0 Monocryl. Incision was cleaned  and dried. Steri-Strips and a bulky sterile dressing applied. Hemovac  drain was hooked to suction and then he was awakened and transported to  recovery in stable condition.        Please note that a surgical assistant was a medical necessity for this procedure to perform it in a safe and expeditious manner. Assistant was necessary to provide appropriate retraction of vital neurovascular structures and to prevent femoral fracture and allow for anatomic placement of the prosthesis.  Gaynelle Arabian, M.D.

## 2015-06-12 NOTE — Interval H&P Note (Signed)
History and Physical Interval Note:  06/12/2015 8:19 AM  Austin Graham  has presented today for surgery, with the diagnosis of OA OF RIGHT HIP  The various methods of treatment have been discussed with the patient and family. After consideration of risks, benefits and other options for treatment, the patient has consented to  Procedure(s): RIGHT TOTAL HIP ARTHROPLASTY ANTERIOR APPROACH (Right) as a surgical intervention .  The patient's history has been reviewed, patient examined, no change in status, stable for surgery.  I have reviewed the patient's chart and labs.  Questions were answered to the patient's satisfaction.     Gearlean Alf

## 2015-06-12 NOTE — Progress Notes (Signed)
Utilization review completed.  

## 2015-06-12 NOTE — Anesthesia Preprocedure Evaluation (Signed)
Anesthesia Evaluation  Patient identified by MRN, date of birth, ID band Patient awake    Reviewed: Allergy & Precautions, NPO status , Patient's Chart, lab work & pertinent test results  Airway Mallampati: I  TM Distance: >3 FB Neck ROM: Full    Dental   Pulmonary asthma , former smoker,    Pulmonary exam normal        Cardiovascular hypertension, Pt. on medications + CAD  Normal cardiovascular exam     Neuro/Psych    GI/Hepatic GERD  Medicated and Controlled,  Endo/Other  diabetes, Type 2, Oral Hypoglycemic Agents  Renal/GU      Musculoskeletal   Abdominal   Peds  Hematology   Anesthesia Other Findings   Reproductive/Obstetrics                             Anesthesia Physical Anesthesia Plan  ASA: II  Anesthesia Plan: Spinal   Post-op Pain Management:    Induction: Intravenous  Airway Management Planned: Natural Airway  Additional Equipment:   Intra-op Plan:   Post-operative Plan:   Informed Consent: I have reviewed the patients History and Physical, chart, labs and discussed the procedure including the risks, benefits and alternatives for the proposed anesthesia with the patient or authorized representative who has indicated his/her understanding and acceptance.     Plan Discussed with: CRNA and Surgeon  Anesthesia Plan Comments:         Anesthesia Quick Evaluation

## 2015-06-13 LAB — BASIC METABOLIC PANEL
Anion gap: 7 (ref 5–15)
BUN: 24 mg/dL — AB (ref 6–20)
CALCIUM: 8.6 mg/dL — AB (ref 8.9–10.3)
CO2: 27 mmol/L (ref 22–32)
CREATININE: 1.18 mg/dL (ref 0.61–1.24)
Chloride: 104 mmol/L (ref 101–111)
Glucose, Bld: 163 mg/dL — ABNORMAL HIGH (ref 65–99)
Potassium: 4.2 mmol/L (ref 3.5–5.1)
SODIUM: 138 mmol/L (ref 135–145)

## 2015-06-13 LAB — CBC
HEMATOCRIT: 32.4 % — AB (ref 39.0–52.0)
HEMOGLOBIN: 10.9 g/dL — AB (ref 13.0–17.0)
MCH: 30.9 pg (ref 26.0–34.0)
MCHC: 33.6 g/dL (ref 30.0–36.0)
MCV: 91.8 fL (ref 78.0–100.0)
Platelets: 181 10*3/uL (ref 150–400)
RBC: 3.53 MIL/uL — ABNORMAL LOW (ref 4.22–5.81)
RDW: 13.7 % (ref 11.5–15.5)
WBC: 10.2 10*3/uL (ref 4.0–10.5)

## 2015-06-13 LAB — GLUCOSE, CAPILLARY
GLUCOSE-CAPILLARY: 164 mg/dL — AB (ref 65–99)
GLUCOSE-CAPILLARY: 217 mg/dL — AB (ref 65–99)

## 2015-06-13 MED ORDER — OXYCODONE HCL 5 MG PO TABS: 5.0000 mg | ORAL_TABLET | ORAL | Status: DC | PRN

## 2015-06-13 MED ORDER — TRAMADOL HCL 50 MG PO TABS: 50.0000 mg | ORAL_TABLET | Freq: Four times a day (QID) | ORAL | Status: DC | PRN

## 2015-06-13 MED ORDER — RIVAROXABAN 10 MG PO TABS: 10.0000 mg | ORAL_TABLET | Freq: Every day | ORAL | Status: DC

## 2015-06-13 MED ORDER — METHOCARBAMOL 500 MG PO TABS: 500.0000 mg | ORAL_TABLET | Freq: Four times a day (QID) | ORAL | Status: DC | PRN

## 2015-06-13 NOTE — Progress Notes (Signed)
Discharge instructions were already completed. I educated Austin Graham about Xarelto therapy and gave him a hardcopy of the Xarelto information.  [name], PharmD, pager [phone]. 06/13/2015,8:53 AM.

## 2015-06-13 NOTE — Progress Notes (Signed)
DC summary sent to CMA to be forwarded to payor

## 2015-06-13 NOTE — Progress Notes (Signed)
   Subjective: 1 Day Post-Op Procedure(s) (LRB): RIGHT TOTAL HIP ARTHROPLASTY ANTERIOR APPROACH (Right) Patient reports pain as mild.   Patient seen in rounds by Dr. Wynelle Link. Patient is well, and has had no acute complaints or problems Patient is ready to go home later today following two sessions of therapy.  Objective: Vital signs in last 24 hours: Temp:  [97.3 F (36.3 C)-98.3 F (36.8 C)] 98 F (36.7 C) (09/15 0503) Pulse Rate:  [72-86] 74 (09/15 0503) Resp:  [10-28] 16 (09/15 0503) BP: (101-136)/(63-77) 123/68 mmHg (09/15 0503) SpO2:  [96 %-100 %] 98 % (09/15 0503) Weight:  [90.719 kg (200 lb)] 90.719 kg (200 lb) (09/14 1340)  Intake/Output from previous day:  Intake/Output Summary (Last 24 hours) at 06/13/15 0802 Last data filed at 06/13/15 0539  Gross per 24 hour  Intake   3875 ml  Output   2320 ml  Net   1555 ml    Labs:  Recent Labs  06/13/15 0410  HGB 10.9*    Recent Labs  06/13/15 0410  WBC 10.2  RBC 3.53*  HCT 32.4*  PLT 181    Recent Labs  06/13/15 0410  NA 138  K 4.2  CL 104  CO2 27  BUN 24*  CREATININE 1.18  GLUCOSE 163*  CALCIUM 8.6*   No results for input(s): LABPT, INR in the last 72 hours.  EXAM: General - Patient is Alert, Appropriate and Oriented Extremity - Neurovascular intact Sensation intact distally Dorsiflexion/Plantar flexion intact Dressing - clean, dry Motor Function - intact, moving foot and toes well on exam.  Hemovac pulled without difficulty.  Assessment/Plan: 1 Day Post-Op Procedure(s) (LRB): RIGHT TOTAL HIP ARTHROPLASTY ANTERIOR APPROACH (Right) Procedure(s) (LRB): RIGHT TOTAL HIP ARTHROPLASTY ANTERIOR APPROACH (Right) Past Medical History  Diagnosis Date  . Kidney stones   . Hypertension   . Hyperlipidemia   . Thyroid disease   . Stress fracture     left knee, right hip  . Complication of anesthesia     hard to waken  . Coronary artery disease     high colesteral  . Dysrhythmia     PAC's  .  Asthma     as child  . Pneumonia   . Goiter   . Diabetes     type II  . History of hiatal hernia   . Arthritis    Principal Problem:   OA (osteoarthritis) of hip  Estimated body mass index is 27.12 kg/(m^2) as calculated from the following:   Height as of this encounter: 6' (1.829 m).   Weight as of this encounter: 90.719 kg (200 lb). Advance diet Up with therapy Discharge home with home health Diet - Cardiac diet and Diabetic diet Follow up - in 2 weeks Activity - WBAT Disposition - Home Condition Upon Discharge - Good D/C Meds - See DC Summary DVT Prophylaxis - Xarelto  [name], PA-C Orthopaedic Surgery 06/13/2015, 8:02 AM

## 2015-06-13 NOTE — Evaluation (Signed)
Occupational Therapy Evaluation Patient Details Name: Austin Graham MRN: [id number] DOB: 07-17-1951 Today's Date: 06/13/2015    History of Present Illness R THR    Clinical Impression   Patient presenting with with decreased ADL and functional mobility independence secondary to above. Patient independent PTA. Patient currently functioning at an overall supervision -> min assist level. Patient will benefit from acute OT to increase overall independence in the areas of ADLs, functional mobility, and overall safety in order to safely discharge home.     Follow Up Recommendations  No OT follow up;Supervision - Intermittent    Equipment Recommendations  3 in 1 bedside comode;Other (comment) (LH sponge)    Recommendations for Other Services  None at this time    Precautions / Restrictions Precautions Precautions: Fall Restrictions Weight Bearing Restrictions: Yes Other Position/Activity Restrictions: WBAT    Mobility Bed Mobility Overal bed mobility: Needs Assistance Bed Mobility: Supine to Sit     Supine to sit: Supervision     General bed mobility comments: HOB slightly elevated. Pt able to manage RLE for bed mobility. No cueing needed  Transfers Overall transfer level: Needs assistance Equipment used: Rolling walker (2 wheeled) Transfers: Sit to/from Stand Sit to Stand: Supervision General transfer comment: Cues for hand placement and sequencing.    Balance Overall balance assessment: Needs assistance Sitting-balance support: No upper extremity supported;Feet supported Sitting balance-Leahy Scale: Good     Standing balance support: Bilateral upper extremity supported;During functional activity Standing balance-Leahy Scale: Good    ADL Overall ADL's : Needs assistance/impaired General ADL Comments: Pt min assist for LB ADLs, set-up for UB ADLs, and supervision for functional ambulation/mobility/transfers using RW. Pt used 3-in-1 over toilet seat and encouraged  pt to use 3-in-1 in shower. Educated pt on safest shower transfer (stepping over threshold backwards using RW).     Pertinent Vitals/Pain Pain Assessment: 0-10 Pain Score: 2  Pain Location: R hip Pain Descriptors / Indicators: Tightness;Sore Pain Intervention(s): Monitored during session;Repositioned     Hand Dominance Right   Extremity/Trunk Assessment Upper Extremity Assessment Upper Extremity Assessment: Overall WFL for tasks assessed   Lower Extremity Assessment Lower Extremity Assessment: Overall WFL for tasks assessed   Cervical / Trunk Assessment Cervical / Trunk Assessment: Normal   Communication Communication Communication: No difficulties   Cognition Arousal/Alertness: Awake/alert Behavior During Therapy: WFL for tasks assessed/performed Overall Cognitive Status: Within Functional Limits for tasks assessed              Home Living Family/patient expects to be discharged to:: Private residence Living Arrangements: Spouse/significant other Available Help at Discharge: Family Type of Home: House Home Access: Stairs to enter Technical brewer of Steps: 3 Entrance Stairs-Rails: None Home Layout: One level     Bathroom Shower/Tub: Walk-in shower;Door   [employer] Toilet: Standard     Home Equipment: Walker - standard   Prior Functioning/Environment Level of Independence: Independent     OT Diagnosis: Generalized weakness;Acute pain   OT Problem List: Decreased strength;Decreased range of motion;Decreased activity tolerance;Decreased safety awareness;Pain;Decreased knowledge of use of DME or AE   OT Treatment/Interventions: Self-care/ADL training;Therapeutic exercise;Energy conservation;DME and/or AE instruction;Therapeutic activities;Patient/family education;Balance training    OT Goals(Current goals can be found in the care plan section) Acute Rehab OT Goals Patient Stated Goal: Resume previous lifestyle with decreased pain OT Goal Formulation:  With patient Time For Goal Achievement: 06/27/15 Potential to Achieve Goals: Good ADL Goals Pt Will Perform Grooming: with modified independence;standing Pt Will Perform Lower Body Bathing: with modified independence;sit  to/from stand (AE prn) Pt Will Perform Lower Body Dressing: with modified independence;sit to/from stand (AE prn) Pt Will Transfer to Toilet: with modified independence;ambulating;bedside commode Pt Will Perform Tub/Shower Transfer: Shower transfer;3 in 1;rolling walker;ambulating;with modified independence Additional ADL Goal #1: Pt will perform functional mobility/ambulation throughout room in prep for ADL at mod I level using RW prn  OT Frequency: Min 2X/week   Barriers to D/C: None known at this time   End of Session Equipment Utilized During Treatment: Rolling walker  Activity Tolerance: Patient tolerated treatment well Patient left: in chair;with call bell/phone within reach   Time: 7989-2119 OT Time Calculation (min): 20 min Charges:  OT General Charges $OT Visit: 1 Procedure OT Evaluation $Initial OT Evaluation Tier I: 1 Procedure  Margreat Widener , MS, OTR/L, CLT Pager: [phone]  06/13/2015, 9:13 AM

## 2015-06-13 NOTE — Progress Notes (Signed)
Physical Therapy Treatment Patient Details Name: Austin Graham MRN: [id number] DOB: August 16, 1951 Today's Date: Jul 04, 2015    History of Present Illness R THR     PT Comments    Pt progressing well with mobility.  Reviewed therex, stairs and car transfers with pt and spouse.  Follow Up Recommendations  Home health PT     Equipment Recommendations  Rolling walker with 5" wheels    Recommendations for Other Services OT consult     Precautions / Restrictions Precautions Precautions: Fall Restrictions Weight Bearing Restrictions: No Other Position/Activity Restrictions: WBAT    Mobility  Bed Mobility                  Transfers Overall transfer level: Needs assistance Equipment used: Rolling walker (2 wheeled) Transfers: Sit to/from Stand Sit to Stand: Supervision         General transfer comment: Cues for hand placement and LE management  Ambulation/Gait Ambulation/Gait assistance: Min guard;Supervision Ambulation Distance (Feet): 160 Feet Assistive device: Rolling walker (2 wheeled) Gait Pattern/deviations: Step-to pattern;Decreased step length - right;Decreased step length - left;Shuffle;Trunk flexed Gait velocity: decr   General Gait Details: cues for sequence, posture and position from RW;    Stairs Stairs: Yes Stairs assistance: Min assist Stair Management: No rails;Step to pattern;Backwards;With walker Number of Stairs: 4 General stair comments: cues for sequence and foot/RW placement. WIfe present and written instructions provided for son-in-law to assist  Wheelchair Mobility    Modified Rankin (Stroke Patients Only)       Balance                                    Cognition Arousal/Alertness: Awake/alert Behavior During Therapy: WFL for tasks assessed/performed Overall Cognitive Status: Within Functional Limits for tasks assessed                      Exercises      General Comments        Pertinent  Vitals/Pain Pain Assessment: 0-10 Pain Score: 3  Pain Location: R hip Pain Descriptors / Indicators: Aching;Sore Pain Intervention(s): Monitored during session;Premedicated before session;Limited activity within patient's tolerance    Home Living                      Prior Function            PT Goals (current goals can now be found in the care plan section) Acute Rehab PT Goals Patient Stated Goal: Resume previous lifestyle with decreased pain PT Goal Formulation: With patient Time For Goal Achievement: 06/15/15 Potential to Achieve Goals: Good Progress towards PT goals: Progressing toward goals    Frequency  7X/week    PT Plan Current plan remains appropriate    Co-evaluation             End of Session Equipment Utilized During Treatment: Gait belt Activity Tolerance: Patient tolerated treatment well Patient left: in chair;with call bell/phone within reach;with family/visitor present     Time: 1324-1400 PT Time Calculation (min) (ACUTE ONLY): 36 min  Charges:  $Gait Training: 8-22 mins $Therapeutic Activity: 8-22 mins                    G Codes:      Ameenah Prosser Jul 04, 2015, 2:41 PM

## 2015-06-13 NOTE — Care Management Note (Signed)
Case Management Note  Patient Details  Name: Garrick Midgley MRN: [id number] Date of Birth: 05-21-1951  Subjective/Objective:                   RIGHT TOTAL HIP ARTHROPLASTY ANTERIOR APPROACH (Right) Action/Plan: Discharge planning Expected Discharge Date:  06/13/15               Expected Discharge Plan:  [place]  In-House Referral:     Discharge planning Services  CM Consult  Post Acute Care Choice:  Home Health Choice offered to:  Patient  DME Arranged:  3-N-1, Walker rolling DME Agency:  [place]:  PT [place] Agency:  [place]  Status of Service:  Completed, signed off  Medicare Important Message Given:    Date Medicare IM Given:    Medicare IM give by:    Date Additional Medicare IM Given:    Additional Medicare Important Message give by:     If discussed at [place] of Stay Meetings, dates discussed:    Additional Comments: CM met with pt in room to offer choice of home health agency.  Pt chooses [facility] to render HHPT.  Address and contact information verified by pt.  Referral called to [employer], Tim. CM called [facility] DME rep, Lecretia to please deliver the 3n1 and rolling walker to room so pt can discharge. No other CM needs were communicated. Dellie Catholic, RN 06/13/2015, 11:15 AM

## 2015-06-13 NOTE — Progress Notes (Signed)
Physical Therapy Treatment Patient Details Name: Austin Graham MRN: [id number] DOB: 02-Sep-1951 Today's Date: 06/13/2015    History of Present Illness R THR     PT Comments    Pt progressing well with mobility and eager for dc this pm  Follow Up Recommendations  Home health PT     Equipment Recommendations  Rolling walker with 5" wheels    Recommendations for Other Services OT consult     Precautions / Restrictions Precautions Precautions: Fall Restrictions Weight Bearing Restrictions: No Other Position/Activity Restrictions: WBAT    Mobility  Bed Mobility Overal bed mobility: Needs Assistance Bed Mobility: Supine to Sit     Supine to sit: Supervision     General bed mobility comments: OOB with OT - states no difficulty  Transfers Overall transfer level: Needs assistance Equipment used: Rolling walker (2 wheeled) Transfers: Sit to/from Stand Sit to Stand: Supervision         General transfer comment: Cues for hand placement and LE management  Ambulation/Gait Ambulation/Gait assistance: Min guard Ambulation Distance (Feet): 200 Feet Assistive device: Rolling walker (2 wheeled) Gait Pattern/deviations: Step-to pattern;Step-through pattern;Decreased step length - right;Decreased step length - left;Shuffle;Trunk flexed Gait velocity: decr   General Gait Details: cues for sequence, posture and position from RW;    Science writer    Modified Rankin (Stroke Patients Only)       Balance Overall balance assessment: Needs assistance Sitting-balance support: No upper extremity supported;Feet supported Sitting balance-Leahy Scale: Good     Standing balance support: Bilateral upper extremity supported;During functional activity Standing balance-Leahy Scale: Good                      Cognition Arousal/Alertness: Awake/alert Behavior During Therapy: WFL for tasks assessed/performed Overall Cognitive Status:  Within Functional Limits for tasks assessed                      Exercises Total Joint Exercises Ankle Circles/Pumps: AAROM;Both;15 reps;Supine Quad Sets: AAROM;Both;10 reps;Supine Heel Slides: AAROM;Right;20 reps;Supine Hip ABduction/ADduction: AAROM;Right;15 reps;Supine    General Comments        Pertinent Vitals/Pain Pain Assessment: 0-10 Pain Score: 2  Pain Location: R hip Pain Descriptors / Indicators: Aching;Sore Pain Intervention(s): Limited activity within patient's tolerance;Monitored during session;Premedicated before session;Ice applied    Home Living Family/patient expects to be discharged to:: Private residence Living Arrangements: Spouse/significant other Available Help at Discharge: Family Type of Home: House Home Access: Stairs to enter Entrance Stairs-Rails: None Home Layout: One level Home Equipment: Environmental consultant - standard      Prior Function Level of Independence: Independent          PT Goals (current goals can now be found in the care plan section) Acute Rehab PT Goals Patient Stated Goal: Resume previous lifestyle with decreased pain PT Goal Formulation: With patient Time For Goal Achievement: 06/15/15 Potential to Achieve Goals: Good Progress towards PT goals: Progressing toward goals    Frequency  7X/week    PT Plan Current plan remains appropriate    Co-evaluation             End of Session Equipment Utilized During Treatment: Gait belt Activity Tolerance: Patient tolerated treatment well Patient left: in chair;with call bell/phone within reach     Time: 1000-1038 PT Time Calculation (min) (ACUTE ONLY): 38 min  Charges:  $Gait Training: 23-37 mins $Therapeutic Exercise: 8-22 mins  G Codes:      Austin Graham 2015/07/03, 12:55 PM

## 2015-06-13 NOTE — Discharge Instructions (Signed)
° °Dr. Frank Aluisio °Total Joint Specialist °[facility] °[address] °[place], [place] [address] °[phone] ° °ANTERIOR APPROACH TOTAL HIP REPLACEMENT POSTOPERATIVE DIRECTIONS ° ° °Hip Rehabilitation, Guidelines Following Surgery  °The results of a hip operation are greatly improved after range of motion and muscle strengthening exercises. Follow all safety measures which are given to protect your hip. If any of these exercises cause increased pain or swelling in your joint, decrease the amount until you are comfortable again. Then slowly increase the exercises. Call your caregiver if you have problems or questions.  ° °HOME CARE INSTRUCTIONS  °Remove items at home which could result in a fall. This includes throw rugs or furniture in walking pathways.  °· ICE to the affected hip every three hours for 30 minutes at a time and then as needed for pain and swelling.  Continue to use ice on the hip for pain and swelling from surgery. You may notice swelling that will progress down to the foot and ankle.  This is normal after surgery.  Elevate the leg when you are not up walking on it.   °· Continue to use the breathing machine which will help keep your temperature down.  It is common for your temperature to cycle up and down following surgery, especially at night when you are not up moving around and exerting yourself.  The breathing machine keeps your lungs expanded and your temperature down. ° ° °DIET °You may resume your previous home diet once your are discharged from the hospital. ° °DRESSING / WOUND CARE / SHOWERING °You may shower 3 days after surgery, but keep the wounds dry during showering.  You may use an occlusive plastic wrap (Press'n Seal for example), NO SOAKING/SUBMERGING IN THE BATHTUB.  If the bandage gets wet, change with a clean dry gauze.  If the incision gets wet, pat the wound dry with a clean towel. °You may start showering once you are discharged home but do not  submerge the incision under water. Just pat the incision dry and apply a dry gauze dressing on daily. °Change the surgical dressing daily and reapply a dry dressing each time. ° °ACTIVITY °Walk with your walker as instructed. °Use walker as long as suggested by your caregivers. °Avoid periods of inactivity such as sitting longer than an hour when not asleep. This helps prevent blood clots.  °You may resume a sexual relationship in one month or when given the OK by your doctor.  °You may return to work once you are cleared by your doctor.  °Do not drive a car for 6 weeks or until released by you surgeon.  °Do not drive while taking narcotics. ° °WEIGHT BEARING °Weight bearing as tolerated with assist device (walker, cane, etc) as directed, use it as long as suggested by your surgeon or therapist, typically at least 4-6 weeks. ° °POSTOPERATIVE CONSTIPATION PROTOCOL °Constipation - defined medically as fewer than three stools per week and severe constipation as less than one stool per week. ° °One of the most common issues patients have following surgery is constipation.  Even if you have a regular bowel pattern at home, your normal regimen is likely to be disrupted due to multiple reasons following surgery.  Combination of anesthesia, postoperative narcotics, change in appetite and fluid intake all can affect your bowels.  In order to avoid complications following surgery, here are some recommendations in order to help you during your recovery period. ° °Colace (docusate) - Pick up an over-the-counter   form of Colace or another stool softener and take twice a day as long as you are requiring postoperative pain medications.  Take with a full glass of water daily.  If you experience loose stools or diarrhea, hold the colace until you stool forms back up.  If your symptoms do not get better within 1 week or if they get worse, check with your doctor. ° °Dulcolax (bisacodyl) - Pick up over-the-counter and take as directed  by the product packaging as needed to assist with the movement of your bowels.  Take with a full glass of water.  Use this product as needed if not relieved by Colace only.  ° °MiraLax (polyethylene glycol) - Pick up over-the-counter to have on hand.  MiraLax is a solution that will increase the amount of water in your bowels to assist with bowel movements.  Take as directed and can mix with a glass of water, juice, soda, coffee, or tea.  Take if you go more than two days without a movement. °Do not use MiraLax more than once per day. Call your doctor if you are still constipated or irregular after using this medication for 7 days in a row. ° °If you continue to have problems with postoperative constipation, please contact the office for further assistance and recommendations.  If you experience "the worst abdominal pain ever" or develop nausea or vomiting, please contact the office immediatly for further recommendations for treatment. ° °ITCHING ° If you experience itching with your medications, try taking only a single pain pill, or even half a pain pill at a time.  You can also use Benadryl over the counter for itching or also to help with sleep.  ° °TED HOSE STOCKINGS °Wear the elastic stockings on both legs for three weeks following surgery during the day but you may remove then at night for sleeping. ° °MEDICATIONS °See your medication summary on the “After Visit Summary” that the nursing staff will review with you prior to discharge.  You may have some home medications which will be placed on hold until you complete the course of blood thinner medication.  It is important for you to complete the blood thinner medication as prescribed by your surgeon.  Continue your approved medications as instructed at time of discharge. ° °PRECAUTIONS °If you experience chest pain or shortness of breath - call 911 immediately for transfer to the hospital emergency department.  °If you develop a fever greater that 101 F,  purulent drainage from wound, increased redness or drainage from wound, foul odor from the wound/dressing, or calf pain - CONTACT YOUR SURGEON.   °                                                °FOLLOW-UP APPOINTMENTS °Make sure you keep all of your appointments after your operation with your surgeon and caregivers. You should call the office at the above phone number and make an appointment for approximately two weeks after the date of your surgery or on the date instructed by your surgeon outlined in the "After Visit Summary". ° °RANGE OF MOTION AND STRENGTHENING EXERCISES  °These exercises are designed to help you keep full movement of your hip joint. Follow your caregiver's or physical therapist's instructions. Perform all exercises about fifteen times, three times per day or as directed. Exercise both hips, even if you   have had only one joint replacement. These exercises can be done on a training (exercise) mat, on the floor, on a table or on a bed. Use whatever works the best and is most comfortable for you. Use music or television while you are exercising so that the exercises are a pleasant break in your day. This will make your life better with the exercises acting as a break in routine you can look forward to.  Lying on your back, slowly slide your foot toward your buttocks, raising your knee up off the floor. Then slowly slide your foot back down until your leg is straight again.  Lying on your back spread your legs as far apart as you can without causing discomfort.  Lying on your side, raise your upper leg and foot straight up from the floor as far as is comfortable. Slowly lower the leg and repeat.  Lying on your back, tighten up the muscle in the front of your thigh (quadriceps muscles). You can do this by keeping your leg straight and trying to raise your heel off the floor. This helps strengthen the largest muscle supporting your knee.  Lying on your back, tighten up the muscles of your  buttocks both with the legs straight and with the knee bent at a comfortable angle while keeping your heel on the floor.   IF YOU ARE TRANSFERRED TO A SKILLED REHAB FACILITY If the patient is transferred to a skilled rehab facility following release from the hospital, a list of the current medications will be sent to the facility for the patient to continue.  When discharged from the skilled rehab facility, please have the facility set up the patient's [place] prior to being released. Also, the skilled facility will be responsible for providing the patient with their medications at time of release from the facility to include their pain medication, the muscle relaxants, and their blood thinner medication. If the patient is still at the rehab facility at time of the two week follow up appointment, the skilled rehab facility will also need to assist the patient in arranging follow up appointment in our office and any transportation needs.  MAKE SURE YOU:  Understand these instructions.  Get help right away if you are not doing well or get worse.    Pick up stool softner and laxative for home use following surgery while on pain medications. May remove the surgical dressing starting tomorrow, Friday 06/14/2015, and then apply a daily dry gauze dressing to the incision. Do not submerge incision under water. Please use good hand washing techniques while changing dressing each day. May shower starting three days after surgery starting Saturday 06/15/2015. Please use a clean towel to pat the incision dry following showers. Continue to use ice for pain and swelling after surgery. Do not use any lotions or creams on the incision until instructed by your surgeon.  Total Hip Protocol.  Take Xarelto for two and a half more weeks, then discontinue Xarelto. Once the patient has completed the Xarelto, they may resume the 81 mg Aspirin.

## 2015-06-13 NOTE — Discharge Summary (Signed)
Physician Discharge Summary   Patient ID: Austin Graham MRN: [id number] DOB/AGE: 62/14/1952 62 y.o.  Admit date: 06/12/2015 Discharge date: 06-13-2015  Primary Diagnosis:  Osteoarthritis of the Right hip.   Admission Diagnoses:  Past Medical History  Diagnosis Date  . Kidney stones   . Hypertension   . Hyperlipidemia   . Thyroid disease   . Stress fracture     left knee, right hip  . Complication of anesthesia     hard to waken  . Coronary artery disease     high colesteral  . Dysrhythmia     PAC's  . Asthma     as child  . Pneumonia   . Goiter   . Diabetes     type II  . History of hiatal hernia   . Arthritis    Discharge Diagnoses:   Principal Problem:   OA (osteoarthritis) of hip  Estimated body mass index is 27.12 kg/(m^2) as calculated from the following:   Height as of this encounter: 6' (1.829 m).   Weight as of this encounter: 90.719 kg (200 lb).  Procedure(s) (LRB): RIGHT TOTAL HIP ARTHROPLASTY ANTERIOR APPROACH (Right)   Consults: None  HPI: Austin Graham is a 64 y.o. male who has advanced end-  stage arthritis of his Right hip with progressively worsening pain and  dysfunction.The patient has failed nonoperative management and presents for  total hip arthroplasty.   Laboratory Data: Admission on 06/12/2015  Component Date Value Ref Range Status  . Glucose-Capillary 06/12/2015 143* 65 - 99 mg/dL Final  . Comment 1 06/12/2015 Notify RN   Final  . Glucose-Capillary 06/12/2015 148* 65 - 99 mg/dL Final  . Comment 1 06/12/2015 Notify RN   Final  . Comment 2 06/12/2015 Document in Chart   Final  . WBC 06/13/2015 10.2  4.0 - 10.5 K/uL Final  . RBC 06/13/2015 3.53* 4.22 - 5.81 MIL/uL Final  . Hemoglobin 06/13/2015 10.9* 13.0 - 17.0 g/dL Final  . HCT 06/13/2015 32.4* 39.0 - 52.0 % Final  . MCV 06/13/2015 91.8  78.0 - 100.0 fL Final  . MCH 06/13/2015 30.9  26.0 - 34.0 pg Final  . MCHC 06/13/2015 33.6  30.0 - 36.0 g/dL Final  . RDW 06/13/2015  13.7  11.5 - 15.5 % Final  . Platelets 06/13/2015 181  150 - 400 K/uL Final  . Sodium 06/13/2015 138  135 - 145 mmol/L Final  . Potassium 06/13/2015 4.2  3.5 - 5.1 mmol/L Final  . Chloride 06/13/2015 104  101 - 111 mmol/L Final  . CO2 06/13/2015 27  22 - 32 mmol/L Final  . Glucose, Bld 06/13/2015 163* 65 - 99 mg/dL Final  . BUN 06/13/2015 24* 6 - 20 mg/dL Final  . Creatinine, Ser 06/13/2015 1.18  0.61 - 1.24 mg/dL Final  . Calcium 06/13/2015 8.6* 8.9 - 10.3 mg/dL Final  . GFR calc non Af Amer 06/13/2015 >60  >60 mL/min Final  . GFR calc Af Amer 06/13/2015 >60  >60 mL/min Final   Comment: (NOTE) The eGFR has been calculated using the CKD EPI equation. This calculation has not been validated in all clinical situations. eGFR's persistently <60 mL/min signify possible Chronic Kidney Disease.   . Anion gap 06/13/2015 7  5 - 15 Final  . Glucose-Capillary 06/12/2015 250* 65 - 99 mg/dL Final  . Glucose-Capillary 06/12/2015 165* 65 - 99 mg/dL Final  . Glucose-Capillary 06/13/2015 164* 65 - 99 mg/dL Final  Hospital Outpatient Visit on 06/05/2015  Component Date Value  Ref Range Status  . MRSA, PCR 06/05/2015 NEGATIVE  NEGATIVE Final  . Staphylococcus aureus 06/05/2015 NEGATIVE  NEGATIVE Final   Comment:        The Xpert SA Assay (FDA approved for NASAL specimens in patients over 62 years of age), is one component of a comprehensive surveillance program.  Test performance has been validated by [facility] for patients greater than or equal to 47 year old. It is not intended to diagnose infection nor to guide or monitor treatment.   Marland Kitchen aPTT 06/05/2015 28  24 - 37 seconds Final  . WBC 06/05/2015 6.5  4.0 - 10.5 K/uL Final  . RBC 06/05/2015 4.64  4.22 - 5.81 MIL/uL Final  . Hemoglobin 06/05/2015 14.4  13.0 - 17.0 g/dL Final  . HCT 06/05/2015 42.7  39.0 - 52.0 % Final  . MCV 06/05/2015 92.0  78.0 - 100.0 fL Final  . MCH 06/05/2015 31.0  26.0 - 34.0 pg Final  . MCHC 06/05/2015 33.7  30.0  - 36.0 g/dL Final  . RDW 06/05/2015 13.3  11.5 - 15.5 % Final  . Platelets 06/05/2015 241  150 - 400 K/uL Final  . Sodium 06/05/2015 138  135 - 145 mmol/L Final  . Potassium 06/05/2015 4.5  3.5 - 5.1 mmol/L Final  . Chloride 06/05/2015 102  101 - 111 mmol/L Final  . CO2 06/05/2015 27  22 - 32 mmol/L Final  . Glucose, Bld 06/05/2015 109* 65 - 99 mg/dL Final  . BUN 06/05/2015 16  6 - 20 mg/dL Final  . Creatinine, Ser 06/05/2015 1.02  0.61 - 1.24 mg/dL Final  . Calcium 06/05/2015 10.0  8.9 - 10.3 mg/dL Final  . Total Protein 06/05/2015 7.7  6.5 - 8.1 g/dL Final  . Albumin 06/05/2015 5.0  3.5 - 5.0 g/dL Final  . AST 06/05/2015 24  15 - 41 U/L Final  . ALT 06/05/2015 33  17 - 63 U/L Final  . Alkaline Phosphatase 06/05/2015 78  38 - 126 U/L Final  . Total Bilirubin 06/05/2015 1.3* 0.3 - 1.2 mg/dL Final  . GFR calc non Af Amer 06/05/2015 >60  >60 mL/min Final  . GFR calc Af Amer 06/05/2015 >60  >60 mL/min Final   Comment: (NOTE) The eGFR has been calculated using the CKD EPI equation. This calculation has not been validated in all clinical situations. eGFR's persistently <60 mL/min signify possible Chronic Kidney Disease.   . Anion gap 06/05/2015 9  5 - 15 Final  . Prothrombin Time 06/05/2015 13.3  11.6 - 15.2 seconds Final  . INR 06/05/2015 0.99  0.00 - 1.49 Final  . ABO/RH(D) 06/05/2015 O POS   Final  . Antibody Screen 06/05/2015 NEG   Final  . Sample Expiration 06/05/2015 06/15/2015   Final  . Color, Urine 06/05/2015 YELLOW  YELLOW Final  . APPearance 06/05/2015 CLEAR  CLEAR Final  . Specific Gravity, Urine 06/05/2015 1.012  1.005 - 1.030 Final  . pH 06/05/2015 6.5  5.0 - 8.0 Final  . Glucose, UA 06/05/2015 100* NEGATIVE mg/dL Final  . Hgb urine dipstick 06/05/2015 NEGATIVE  NEGATIVE Final  . Bilirubin Urine 06/05/2015 NEGATIVE  NEGATIVE Final  . Ketones, ur 06/05/2015 NEGATIVE  NEGATIVE mg/dL Final  . Protein, ur 06/05/2015 NEGATIVE  NEGATIVE mg/dL Final  . Urobilinogen, UA  06/05/2015 0.2  0.0 - 1.0 mg/dL Final  . Nitrite 06/05/2015 NEGATIVE  NEGATIVE Final  . Leukocytes, UA 06/05/2015 NEGATIVE  NEGATIVE Final   MICROSCOPIC NOT DONE ON URINES WITH  NEGATIVE PROTEIN, BLOOD, LEUKOCYTES, NITRITE, OR GLUCOSE <1000 mg/dL.  . ABO/RH(D) 06/05/2015 O POS   Final     X-Rays:Dg Pelvis Portable  06/12/2015   CLINICAL DATA:  Postop right hip  EXAM: PORTABLE PELVIS 1-2 VIEWS  COMPARISON:  None.  FINDINGS: Total right hip arthroplasty without periprosthetic fracture or dislocation.  IMPRESSION: No acute finding after total right hip arthroplasty.   Electronically Signed   By: Monte Fantasia M.D.   On: 06/12/2015 10:54   Dg C-arm 1-60 Min-no Report  06/12/2015   CLINICAL DATA: surgery   C-ARM 1-60 MINUTES  Fluoroscopy was utilized by the requesting physician.  No radiographic  interpretation.     EKG: Orders placed or performed during the hospital encounter of 06/05/15  . EKG 12-Lead  . EKG 12-Lead     Hospital Course: Patient was admitted to [facility] and taken to the OR and underwent the above state procedure without complications.  Patient tolerated the procedure well and was later transferred to the recovery room and then to the orthopaedic floor for postoperative care.  They were given PO and IV analgesics for pain control following their surgery.  They were given 24 hours of postoperative antibiotics of  Anti-infectives    Start     Dose/Rate Route Frequency Ordered Stop   06/12/15 1500  ceFAZolin (ANCEF) IVPB 2 g/50 mL premix     2 g 100 mL/hr over 30 Minutes Intravenous Every 6 hours 06/12/15 1351 06/12/15 2121   06/12/15 0648  ceFAZolin (ANCEF) IVPB 2 g/50 mL premix     2 g 100 mL/hr over 30 Minutes Intravenous On call to O.R. 06/12/15 [id number] 06/12/15 0850     and started on DVT prophylaxis in the form of Xarelto.   PT and OT were ordered for total hip protocol.  The patient was allowed to be WBAT with therapy. Discharge planning was consulted to help  with postop disposition and equipment needs.  Patient had a good night on the evening of surgery.  They started to get up OOB with therapy on day one.  Hemovac drain was pulled without difficulty.   Dressing was clean and dry. Patient was seen in rounds by Dr. Wynelle Link on day one and was felt that as long as he did well with therapy that he would be ready to go home.  Discharge home with home health Diet - Cardiac diet and Diabetic diet Follow up - in 2 weeks Activity - WBAT Disposition - Home Condition Upon Discharge - Good D/C Meds - See DC Summary DVT Prophylaxis - Xarelto      Discharge Instructions    Call MD / Call 911    Complete by:  As directed   If you experience chest pain or shortness of breath, CALL 911 and be transported to the hospital emergency room.  If you develope a fever above 101 F, pus (white drainage) or increased drainage or redness at the wound, or calf pain, call your surgeon's office.     Change dressing    Complete by:  As directed   You may change your dressing dressing daily with sterile 4 x 4 inch gauze dressing and paper tape.  Do not submerge the incision under water.     Constipation Prevention    Complete by:  As directed   Drink plenty of fluids.  Prune juice may be helpful.  You may use a stool softener, such as Colace (over the counter) 100 mg twice a  day.  Use MiraLax (over the counter) for constipation as needed.     Diet - low sodium heart healthy    Complete by:  As directed      Diet Carb Modified    Complete by:  As directed      Discharge instructions    Complete by:  As directed   Pick up stool softner and laxative for home use following surgery while on pain medications. May remove the surgical dressing starting tomorrow, Friday 06/14/2015, and then apply a daily dry gauze dressing to the incision. Do not submerge incision under water. Please use good hand washing techniques while changing dressing each day. May shower starting three days  after surgery starting Saturday 06/15/2015. Please use a clean towel to pat the incision dry following showers. Continue to use ice for pain and swelling after surgery. Do not use any lotions or creams on the incision until instructed by your surgeon.  Total Hip Protocol.  Take Xarelto for two and a half more weeks, then discontinue Xarelto. Once the patient has completed the Xarelto, they may resume the 81 mg Aspirin.  Postoperative Constipation Protocol  Constipation - defined medically as fewer than three stools per week and severe constipation as less than one stool per week.  One of the most common issues patients have following surgery is constipation.  Even if you have a regular bowel pattern at home, your normal regimen is likely to be disrupted due to multiple reasons following surgery.  Combination of anesthesia, postoperative narcotics, change in appetite and fluid intake all can affect your bowels.  In order to avoid complications following surgery, here are some recommendations in order to help you during your recovery period.  Colace (docusate) - Pick up an over-the-counter form of Colace or another stool softener and take twice a day as long as you are requiring postoperative pain medications.  Take with a full glass of water daily.  If you experience loose stools or diarrhea, hold the colace until you stool forms back up.  If your symptoms do not get better within 1 week or if they get worse, check with your doctor.  Dulcolax (bisacodyl) - Pick up over-the-counter and take as directed by the product packaging as needed to assist with the movement of your bowels.  Take with a full glass of water.  Use this product as needed if not relieved by Colace only.   MiraLax (polyethylene glycol) - Pick up over-the-counter to have on hand.  MiraLax is a solution that will increase the amount of water in your bowels to assist with bowel movements.  Take as directed and can mix with a glass of  water, juice, soda, coffee, or tea.  Take if you go more than two days without a movement. Do not use MiraLax more than once per day. Call your doctor if you are still constipated or irregular after using this medication for 7 days in a row.  If you continue to have problems with postoperative constipation, please contact the office for further assistance and recommendations.  If you experience "the worst abdominal pain ever" or develop nausea or vomiting, please contact the office immediatly for further recommendations for treatment.     Do not sit on low chairs, stoools or toilet seats, as it may be difficult to get up from low surfaces    Complete by:  As directed      Driving restrictions    Complete by:  As directed  No driving until released by the physician.     Increase activity slowly as tolerated    Complete by:  As directed      Lifting restrictions    Complete by:  As directed   No lifting until released by the physician.     Patient may shower    Complete by:  As directed   You may shower without a dressing once there is no drainage.  Do not wash over the wound.  If drainage remains, do not shower until drainage stops.     TED hose    Complete by:  As directed   Use stockings (TED hose) for 3 weeks on both leg(s).  You may remove them at night for sleeping.     Weight bearing as tolerated    Complete by:  As directed   Laterality:  right  Extremity:  Lower            Medication List    STOP taking these medications        aspirin 81 MG tablet     CENTRUM SILVER ADULT 50+ PO     fish oil-omega-3 fatty acids 1000 MG capsule     ibuprofen 200 MG tablet  Commonly known as:  ADVIL,MOTRIN     multivitamin with minerals Tabs tablet     Vitamin D 2000 UNITS Caps      TAKE these medications        atorvastatin 80 MG tablet  Commonly known as:  LIPITOR  TAKE 1 TABLET (80 MG TOTAL) BY MOUTH DAILY. AS DIRECTED     atorvastatin 40 MG tablet  Commonly known as:   LIPITOR  Take 40 mg by mouth at bedtime.     glucose blood test strip  Commonly known as:  ONE TOUCH ULTRA TEST  Check BS QD and PRN. E11.9     metFORMIN 500 MG 24 hr tablet  Commonly known as:  GLUCOPHAGE-XR  TAKE 1 TABLET (500 MG TOTAL) BY MOUTH 2 (TWO) TIMES DAILY.     metFORMIN 500 MG 24 hr tablet  Commonly known as:  GLUCOPHAGE-XR  TAKE 1 TABLET BY MOUTH TWICE A DAY     methocarbamol 500 MG tablet  Commonly known as:  ROBAXIN  Take 1 tablet (500 mg total) by mouth every 6 (six) hours as needed for muscle spasms.     olmesartan 20 MG tablet  Commonly known as:  BENICAR  Take 10-20 mg by mouth daily. 20 mg on Monday Wednesday and Friday 10 mg on Tuesday Thursday Saturday Sunday     oxyCODONE 5 MG immediate release tablet  Commonly known as:  Oxy IR/ROXICODONE  Take 1-2 tablets (5-10 mg total) by mouth every 3 (three) hours as needed for moderate pain or severe pain.     rivaroxaban 10 MG Tabs tablet  Commonly known as:  XARELTO  Take 1 tablet (10 mg total) by mouth daily with breakfast. Take Xarelto for a total of three weeks, then discontinue Xarelto. Once the patient has completed the Xarelto, they may resume the 81 mg Aspirin.     traMADol 50 MG tablet  Commonly known as:  ULTRAM  Take 1-2 tablets (50-100 mg total) by mouth every 6 (six) hours as needed (mild pain).       Follow-up Information    Follow up with Gearlean Alf, MD. Schedule an appointment as soon as possible for a visit on 06/25/2015.   Specialty:  Orthopedic Surgery   Why:  Call office at [phone] to setup appointment on Tuesday 06/25/2015 with Dr. Wynelle Link.   Contact information:   [address] [place] [address] [phone]       Signed: [name], PA-C Orthopaedic Surgery 06/13/2015, 8:14 AM

## 2015-06-18 IMAGING — CR DG CHEST 2V
2 series · 2 of 2 positions shown · non-contrast
Comparison: None.

CLINICAL DATA: Hypertension

EXAM:
CHEST  2 VIEW

[view not recorded (1 of 2)]
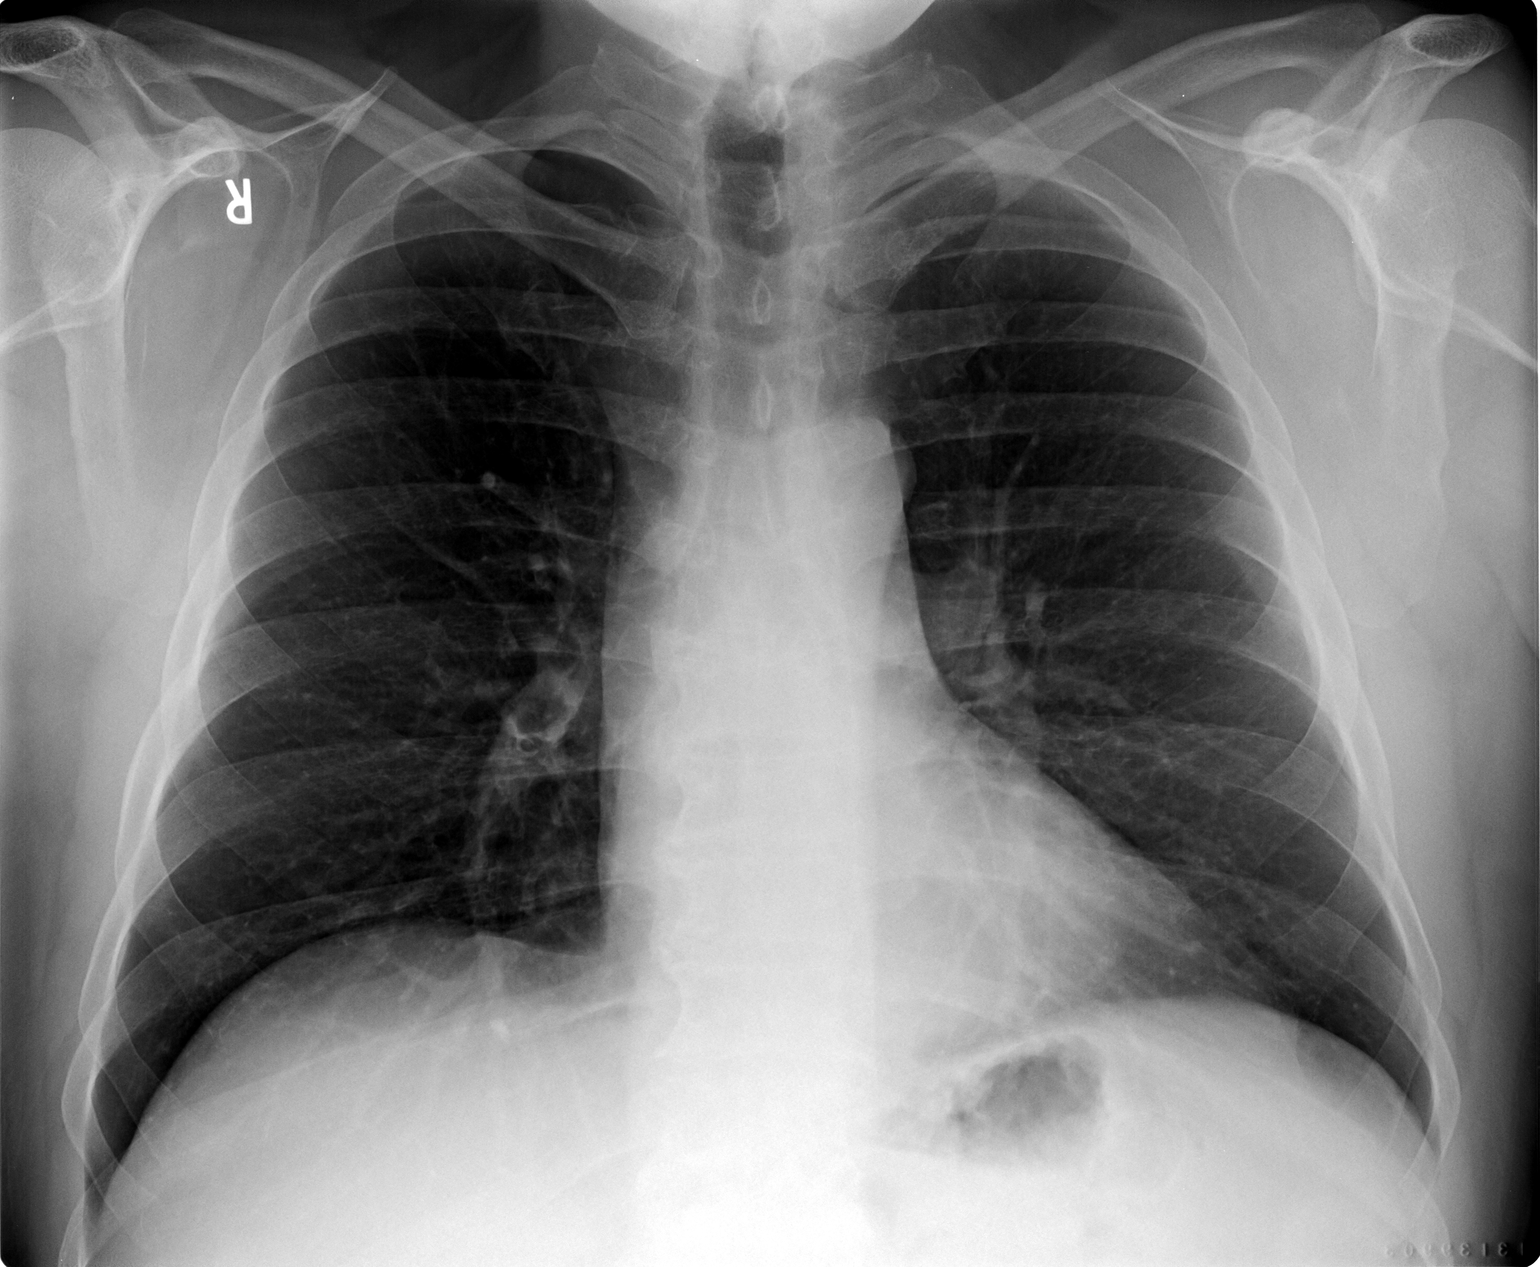

[view not recorded (2 of 2)]
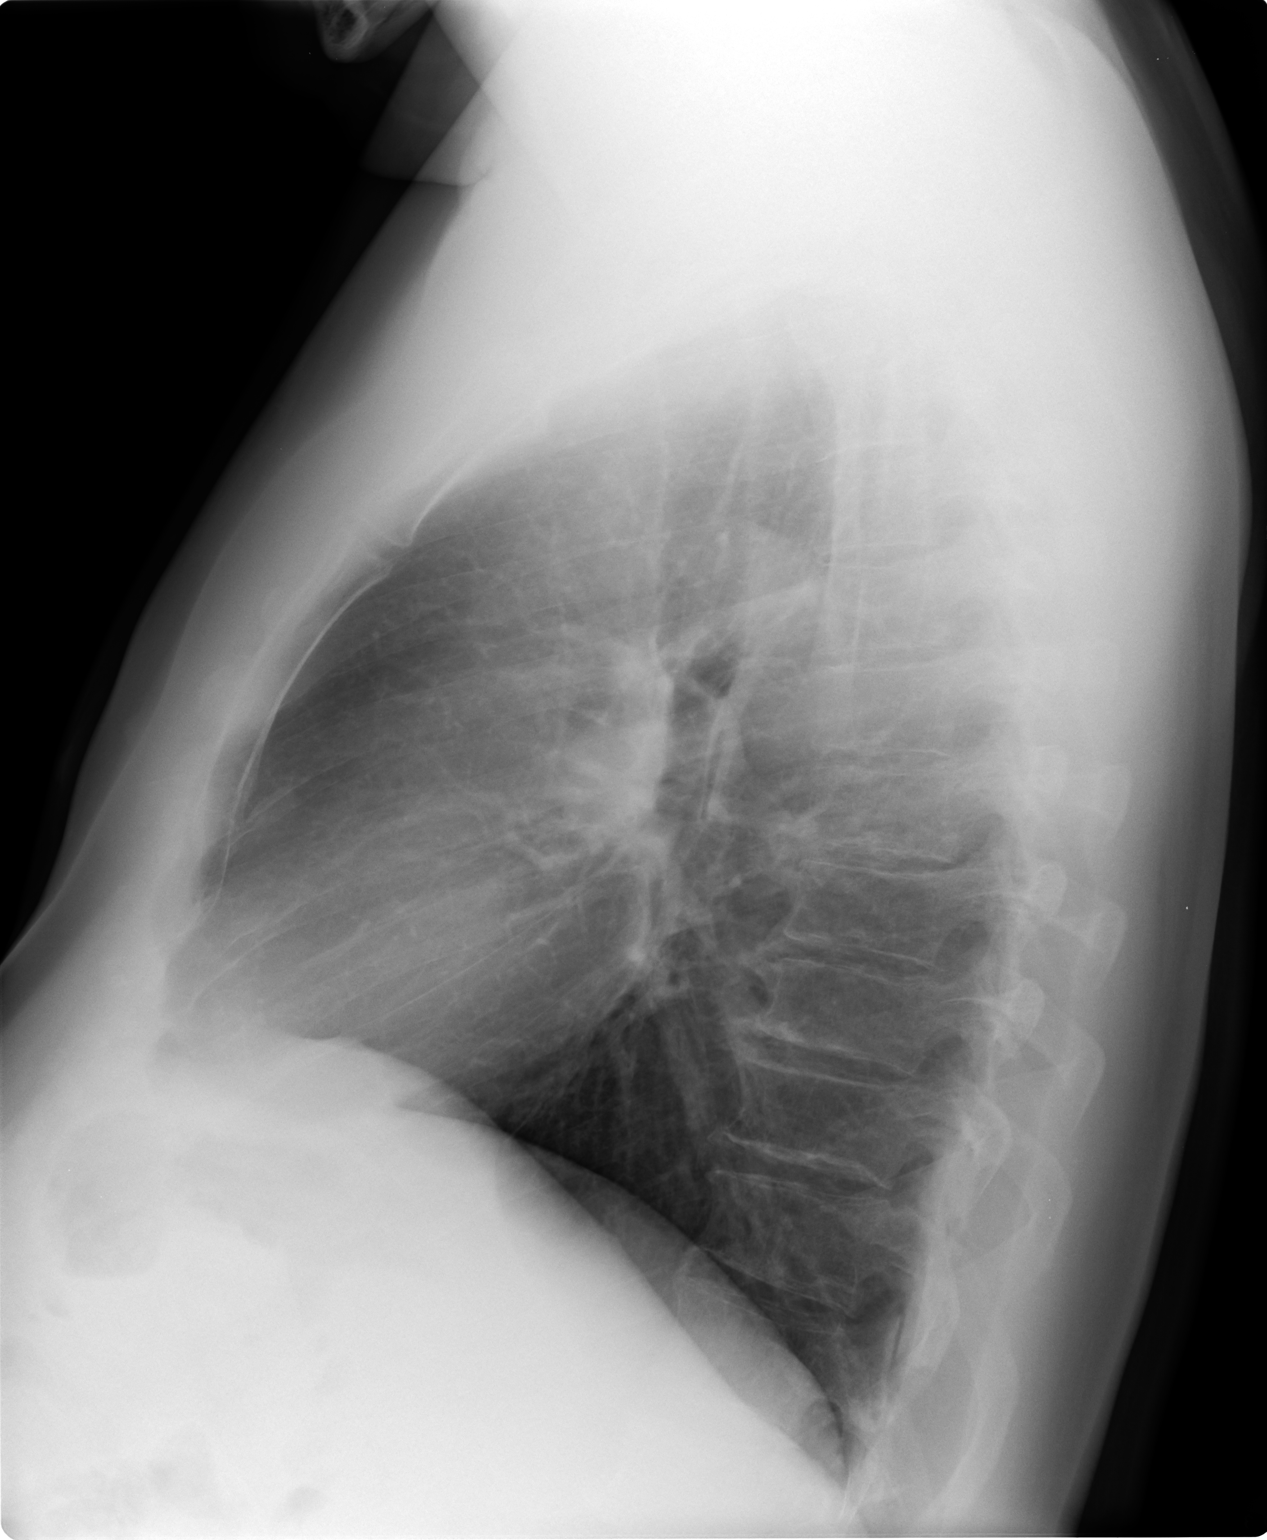

[2 of 2 positions shown; findings below may reference images not displayed]

FINDINGS: Cardiomediastinal silhouette is unremarkable. No acute infiltrate or
pleural effusion. No pulmonary edema. Mild degenerative changes
thoracic spine.
IMPRESSION: No active cardiopulmonary disease.

## 2015-08-07 ENCOUNTER — Ambulatory Visit (INDEPENDENT_AMBULATORY_CARE_PROVIDER_SITE_OTHER): Payer: BLUE CROSS/BLUE SHIELD | Admitting: Family Medicine

## 2015-08-07 ENCOUNTER — Encounter: Payer: Self-pay | Admitting: Family Medicine

## 2015-08-07 VITALS — BP 128/75 | HR 101 | Temp 97.3°F | Ht 72.0 in | Wt 206.0 lb

## 2015-08-07 DIAGNOSIS — E119 Type 2 diabetes mellitus without complications: Secondary | ICD-10-CM | POA: Diagnosis not present

## 2015-08-07 DIAGNOSIS — I10 Essential (primary) hypertension: Secondary | ICD-10-CM

## 2015-08-07 DIAGNOSIS — E559 Vitamin D deficiency, unspecified: Secondary | ICD-10-CM

## 2015-08-07 DIAGNOSIS — N400 Enlarged prostate without lower urinary tract symptoms: Secondary | ICD-10-CM

## 2015-08-07 DIAGNOSIS — E785 Hyperlipidemia, unspecified: Secondary | ICD-10-CM | POA: Diagnosis not present

## 2015-08-07 DIAGNOSIS — Z9660 Presence of unspecified orthopedic joint implant: Secondary | ICD-10-CM | POA: Diagnosis not present

## 2015-08-07 DIAGNOSIS — K449 Diaphragmatic hernia without obstruction or gangrene: Secondary | ICD-10-CM

## 2015-08-07 DIAGNOSIS — Z23 Encounter for immunization: Secondary | ICD-10-CM

## 2015-08-07 DIAGNOSIS — Z96641 Presence of right artificial hip joint: Secondary | ICD-10-CM

## 2015-08-07 DIAGNOSIS — K219 Gastro-esophageal reflux disease without esophagitis: Secondary | ICD-10-CM

## 2015-08-07 LAB — POCT GLYCOSYLATED HEMOGLOBIN (HGB A1C): Hemoglobin A1C: 7

## 2015-08-07 LAB — POCT UA - MICROALBUMIN: MICROALBUMIN (UR) POC: 20 mg/L

## 2015-08-07 NOTE — Patient Instructions (Addendum)
Continue current medications. Continue good therapeutic lifestyle changes which include good diet and exercise. Fall precautions discussed with patient. If an FOBT was given today- please return it to our front desk. If you are over 62 years old - you may need Prevnar 81 or the adult Pneumonia vaccine.  **Flu shots are available--- please call and schedule a FLU-CLINIC appointment**  After your visit with [place] today you will receive a survey in the mail or online from [employer] regarding your care with [place]. Please take a moment to fill this out. Your feedback is very important to [place] as you can help [place] better understand your patient needs as well as improve your experience and satisfaction. WE CARE ABOUT YOU!!!   Continue to monitor blood sugars regularly and increase activity and watch diet more closely Be sure check with your insurance regarding the Prevnar vaccine Follow-up with urology and orthopedics as planned We will call you with your lab work results as soon as they become available Return the FOBT

## 2015-08-07 NOTE — Addendum Note (Signed)
Addended by: Pollyann Kennedy F on: 08/07/2015 09:54 AM   Modules accepted: Orders

## 2015-08-07 NOTE — Progress Notes (Signed)
Subjective:    Patient ID: Austin Graham, male    DOB: 01-Jul-1951, 64 y.o.   MRN: [id number]  HPI Pt here for follow up and management of chronic medical problems which includes hyperlipidemia, hypertension, and diabetes. He is taking medications regularly. The patient has recently had a right hip replacement and is doing incredibly well with this and feeling much better and able to walk without being in pain. The patient denies any other symptoms. He is due to bring back a fecal occult blood test cards and get his lab work today and he will also get his flu shot today. He'll check with his insurance regarding the Prevnar vaccine. The patient denies chest pain shortness of breath trouble swallowing heartburn indigestion nausea vomiting diarrhea or blood in the stool. He does have urinary frequency and last saw the urologist in April of this year and has an enlarged prostate. He checks his blood sugars at home and unfortunately they have been running higher recently as he is not exercising as much and he is not been sticking with his diet.     Patient Active Problem List   Diagnosis Date Noted  . OA (osteoarthritis) of hip 06/12/2015  . Prostate lump 12/25/2013  . Hypertension 11/30/2013  . Hyperlipidemia 11/30/2013  . Multinodular goiter 11/30/2013  . Nephrolithiasis 11/30/2013  . BPH (benign prostatic hyperplasia) 11/30/2013  . Degenerative disc disease, lumbar 11/30/2013  . Gastroesophageal reflux disease with hiatal hernia 11/30/2013  . Diabetes, poorly controlled    Outpatient Encounter Prescriptions as of 08/07/2015  Medication Sig  . atorvastatin (LIPITOR) 40 MG tablet Take 40 mg by mouth at bedtime.  Marland Kitchen glucose blood (ONE TOUCH ULTRA TEST) test strip Check BS QD and PRN. E11.9  . metFORMIN (GLUCOPHAGE-XR) 500 MG 24 hr tablet TAKE 1 TABLET (500 MG TOTAL) BY MOUTH 2 (TWO) TIMES DAILY. (Patient taking differently: TAKE 1 TABLET (500 MG TOTAL) BY MOUTH 3 (Three) TIMES DAILY.)  .  metFORMIN (GLUCOPHAGE-XR) 500 MG 24 hr tablet TAKE 1 TABLET BY MOUTH TWICE A DAY  . olmesartan (BENICAR) 20 MG tablet Take 10-20 mg by mouth daily. 20 mg on Monday Wednesday and Friday 10 mg on Tuesday Thursday Saturday Sunday  . [DISCONTINUED] atorvastatin (LIPITOR) 80 MG tablet TAKE 1 TABLET (80 MG TOTAL) BY MOUTH DAILY. AS DIRECTED (Patient taking differently: 25m daily)  . [DISCONTINUED] methocarbamol (ROBAXIN) 500 MG tablet Take 1 tablet (500 mg total) by mouth every 6 (six) hours as needed for muscle spasms.  . [DISCONTINUED] oxyCODONE (OXY IR/ROXICODONE) 5 MG immediate release tablet Take 1-2 tablets (5-10 mg total) by mouth every 3 (three) hours as needed for moderate pain or severe pain.  . [DISCONTINUED] rivaroxaban (XARELTO) 10 MG TABS tablet Take 1 tablet (10 mg total) by mouth daily with breakfast. Take Xarelto for a total of three weeks, then discontinue Xarelto. Once the patient has completed the Xarelto, they may resume the 81 mg Aspirin.  . [DISCONTINUED] traMADol (ULTRAM) 50 MG tablet Take 1-2 tablets (50-100 mg total) by mouth every 6 (six) hours as needed (mild pain).   No facility-administered encounter medications on file as of 08/07/2015.      Review of Systems  Constitutional: Negative.   HENT: Negative.   Eyes: Negative.   Respiratory: Negative.   Cardiovascular: Negative.   Gastrointestinal: Negative.   Endocrine: Negative.   Genitourinary: Negative.   Musculoskeletal: Negative.   Skin: Negative.   Allergic/Immunologic: Negative.   Neurological: Negative.   Hematological: Negative.  Psychiatric/Behavioral: Negative.        Objective:   Physical Exam  Constitutional: He is oriented to person, place, and time. He appears well-developed and well-nourished.  HENT:  Head: Normocephalic and atraumatic.  Right Ear: External ear normal.  Left Ear: External ear normal.  Nose: Nose normal.  Mouth/Throat: Oropharynx is clear and moist. No oropharyngeal exudate.    Eyes: Conjunctivae and EOM are normal. Pupils are equal, round, and reactive to light. Right eye exhibits no discharge. Left eye exhibits no discharge. No scleral icterus.  Neck: Normal range of motion. Neck supple. No thyromegaly present.  No carotid bruits thyromegaly or anterior cervical adenopathy  Cardiovascular: Normal rate, regular rhythm, normal heart sounds and intact distal pulses.  Exam reveals no gallop and no friction rub.   No murmur heard. Heart has a regular rate and rhythm at 96/m  Pulmonary/Chest: Effort normal and breath sounds normal. No respiratory distress. He has no wheezes. He has no rales. He exhibits no tenderness.  Clear anteriorly and posteriorly without axillary adenopathy  Abdominal: Soft. Bowel sounds are normal. He exhibits no mass. There is no tenderness. There is no rebound and no guarding.  No organ enlargement masses or bruits and no inguinal adenopathy  Genitourinary:  Patient last saw the urologist in April of this year  Musculoskeletal: Normal range of motion. He exhibits no edema or tenderness.  The right hip is well healed and the patient has good mobility and the incision looks excellent  Lymphadenopathy:    He has no cervical adenopathy.  Neurological: He is alert and oriented to person, place, and time. He has normal reflexes. No cranial nerve deficit.  Skin: Skin is warm and dry. No rash noted.  Psychiatric: He has a normal mood and affect. His behavior is normal. Judgment and thought content normal.  Nursing note and vitals reviewed.   BP 128/75 mmHg  Pulse 101  Temp(Src) 97.3 F (36.3 C) (Oral)  Ht 6' (1.829 m)  Wt 206 lb (93.441 kg)  BMI 27.93 kg/m2       Assessment & Plan:  1. Type 2 diabetes mellitus without complication, without long-term current use of insulin (HCC) -Try to do better with diet and continue to monitor blood sugars closely and stay active and drink plenty of water - POCT glycosylated hemoglobin (Hb A1C) -  BMP8+EGFR - CBC with Differential/Platelet - POCT UA - Microalbumin  2. Hyperlipidemia -Continue current treatment pending results of lab work - CBC with Differential/Platelet - NMR, lipoprofile  3. BPH (benign prostatic hyperplasia) -Continue follow-up with urology - CBC with Differential/Platelet  4. Essential hypertension -Blood pressure is good today the patient should continue with current treatment - BMP8+EGFR - CBC with Differential/Platelet - Hepatic function panel  5. Vitamin D deficiency -We will check the results of this and make decisions about any supplemental vitamin D replacement once lab work is returned - CBC with Differential/Platelet - Vit D  25 hydroxy (rtn osteoporosis monitoring)  6. Gastroesophageal reflux disease with hiatal hernia -Patient should continue to watch his diet closely and he has no complaints with this today. - CBC with Differential/Platelet  7. Status post right hip replacement -Follow up with orthopedics as planned  Patient Instructions  Continue current medications. Continue good therapeutic lifestyle changes which include good diet and exercise. Fall precautions discussed with patient. If an FOBT was given today- please return it to our front desk. If you are over 66 years old - you may need Prevnar 13 or  the adult Pneumonia vaccine.  **Flu shots are available--- please call and schedule a FLU-CLINIC appointment**  After your visit with [place] today you will receive a survey in the mail or online from [employer] regarding your care with [place]. Please take a moment to fill this out. Your feedback is very important to [place] as you can help [place] better understand your patient needs as well as improve your experience and satisfaction. WE CARE ABOUT YOU!!!   Continue to monitor blood sugars regularly and increase activity and watch diet more closely Be sure check with your insurance regarding the Prevnar vaccine Follow-up with urology and  orthopedics as planned We will call you with your lab work results as soon as they become available Return the FOBT   Arrie Senate MD

## 2015-08-08 ENCOUNTER — Ambulatory Visit: Payer: BLUE CROSS/BLUE SHIELD | Admitting: Family Medicine

## 2015-08-08 LAB — BMP8+EGFR
BUN / CREAT RATIO: 13 (ref 10–22)
BUN: 15 mg/dL (ref 8–27)
CALCIUM: 10 mg/dL (ref 8.6–10.2)
CO2: 24 mmol/L (ref 18–29)
Chloride: 100 mmol/L (ref 97–106)
Creatinine, Ser: 1.13 mg/dL (ref 0.76–1.27)
GFR, EST AFRICAN AMERICAN: 80 mL/min/{1.73_m2} (ref >59)
GFR, EST NON AFRICAN AMERICAN: 69 mL/min/{1.73_m2} (ref >59)
Glucose: 146 mg/dL — ABNORMAL HIGH (ref 65–99)
Potassium: 4.4 mmol/L (ref 3.5–5.2)
Sodium: 139 mmol/L (ref 136–144)

## 2015-08-08 LAB — HEPATIC FUNCTION PANEL
ALT: 22 IU/L (ref 0–44)
AST: 17 IU/L (ref 0–40)
Albumin: 4.9 g/dL — ABNORMAL HIGH (ref 3.6–4.8)
Alkaline Phosphatase: 107 IU/L (ref 39–117)
BILIRUBIN TOTAL: 0.8 mg/dL (ref 0.0–1.2)
BILIRUBIN, DIRECT: 0.2 mg/dL (ref 0.00–0.40)
Total Protein: 6.4 g/dL (ref 6.0–8.5)

## 2015-08-08 LAB — CBC WITH DIFFERENTIAL/PLATELET
BASOS: 1 %
Basophils Absolute: 0 10*3/uL (ref 0.0–0.2)
EOS (ABSOLUTE): 0.2 10*3/uL (ref 0.0–0.4)
Eos: 3 %
HEMATOCRIT: 39.2 % (ref 37.5–51.0)
HEMOGLOBIN: 13.7 g/dL (ref 12.6–17.7)
IMMATURE GRANS (ABS): 0 10*3/uL (ref 0.0–0.1)
Immature Granulocytes: 0 %
LYMPHS: 32 %
Lymphocytes Absolute: 2.1 10*3/uL (ref 0.7–3.1)
MCH: 30.4 pg (ref 26.6–33.0)
MCHC: 34.9 g/dL (ref 31.5–35.7)
MCV: 87 fL (ref 79–97)
MONOCYTES: 8 %
Monocytes Absolute: 0.5 10*3/uL (ref 0.1–0.9)
NEUTROS ABS: 3.8 10*3/uL (ref 1.4–7.0)
Neutrophils: 56 %
Platelets: 281 10*3/uL (ref 150–379)
RBC: 4.51 x10E6/uL (ref 4.14–5.80)
RDW: 13.3 % (ref 12.3–15.4)
WBC: 6.7 10*3/uL (ref 3.4–10.8)

## 2015-08-08 LAB — NMR, LIPOPROFILE
CHOLESTEROL: 146 mg/dL (ref 100–199)
HDL CHOLESTEROL BY NMR: 37 mg/dL — AB (ref >39)
HDL Particle Number: 27.4 umol/L — ABNORMAL LOW (ref >=30.5)
LDL PARTICLE NUMBER: 1170 nmol/L — AB (ref <1000)
LDL SIZE: 19.9 nm (ref >20.5)
LDL-C: 72 mg/dL (ref 0–99)
LP-IR Score: 76 — ABNORMAL HIGH (ref <=45)
SMALL LDL PARTICLE NUMBER: 828 nmol/L — AB (ref <=527)
TRIGLYCERIDES BY NMR: 187 mg/dL — AB (ref 0–149)

## 2015-08-08 LAB — MICROALBUMIN, URINE: Microalbumin, Urine: 13.9 ug/mL

## 2015-08-08 LAB — VITAMIN D 25 HYDROXY (VIT D DEFICIENCY, FRACTURES): Vit D, 25-Hydroxy: 55.5 ng/mL (ref 30.0–100.0)

## 2015-09-09 ENCOUNTER — Other Ambulatory Visit: Payer: Self-pay | Admitting: Family Medicine

## 2015-09-24 ENCOUNTER — Other Ambulatory Visit: Payer: Self-pay | Admitting: Family Medicine

## 2015-10-22 ENCOUNTER — Other Ambulatory Visit: Payer: Self-pay | Admitting: Family Medicine

## 2015-11-04 ENCOUNTER — Ambulatory Visit (INDEPENDENT_AMBULATORY_CARE_PROVIDER_SITE_OTHER): Payer: BLUE CROSS/BLUE SHIELD | Admitting: Nurse Practitioner

## 2015-11-04 VITALS — BP 131/86 | HR 87 | Ht 72.0 in | Wt 212.0 lb

## 2015-11-04 DIAGNOSIS — R079 Chest pain, unspecified: Secondary | ICD-10-CM

## 2015-11-04 NOTE — Patient Instructions (Signed)

## 2015-11-04 NOTE — Progress Notes (Signed)
   Subjective:    Patient ID: Austin Graham, male    DOB: November 16, 1950, 65 y.o.   MRN: [id number]  HPI  Patient comes in today c/o chest pain- Started going to [facility] to exercise right hip replacement- He was shown how to work all the machines and he noticed 2 days later that he started having chest Pain- Actually started Saturday and feels like a " deep pain"- movement seems to increase pain- has some SOB but he thinks that is because he is scared- Rates pain 3/10 at most- is intermittent. Has never had heart problems other than occasional palpatations.  * Has family history of CAD  Review of Systems  Constitutional: Negative.   HENT: Negative.   Respiratory: Negative.   Cardiovascular: Negative.   Genitourinary: Negative.   Neurological: Negative.   Psychiatric/Behavioral: Negative.   All other systems reviewed and are negative.      Objective:   Physical Exam  Constitutional: He is oriented to person, place, and time. He appears well-developed and well-nourished.  Cardiovascular: Normal rate, regular rhythm and normal heart sounds.   Pulmonary/Chest: Effort normal and breath sounds normal.  Neurological: He is alert and oriented to person, place, and time.  Skin: Skin is warm.  Psychiatric: He has a normal mood and affect. His behavior is normal. Judgment and thought content normal.    BP 131/86 mmHg  Pulse 87  Ht 6' (1.829 m)  Wt 212 lb (96.163 kg)  BMI 28.75 kg/m2  SpO2 95%  EKG- NSR-Mary-Margaret Hassell Done, FNP      Assessment & Plan:   1. Chest pain, unspecified chest pain type    No strenous activity Referral to cardiology If develop heavy chest pain then go to [place], FNP

## 2015-11-08 ENCOUNTER — Encounter: Payer: Self-pay | Admitting: Family

## 2015-11-08 ENCOUNTER — Ambulatory Visit (INDEPENDENT_AMBULATORY_CARE_PROVIDER_SITE_OTHER): Payer: BLUE CROSS/BLUE SHIELD | Admitting: Family

## 2015-11-08 ENCOUNTER — Ambulatory Visit: Payer: BLUE CROSS/BLUE SHIELD | Admitting: Family

## 2015-11-08 VITALS — BP 108/73 | HR 100 | Temp 96.9°F | Ht 72.0 in | Wt 210.0 lb

## 2015-11-08 DIAGNOSIS — J101 Influenza due to other identified influenza virus with other respiratory manifestations: Secondary | ICD-10-CM | POA: Diagnosis not present

## 2015-11-08 DIAGNOSIS — R6889 Other general symptoms and signs: Secondary | ICD-10-CM | POA: Diagnosis not present

## 2015-11-08 LAB — POCT INFLUENZA A/B
Influenza A, POC: POSITIVE — AB
Influenza B, POC: NEGATIVE

## 2015-11-08 MED ORDER — OSELTAMIVIR PHOSPHATE 75 MG PO CAPS: 75.0000 mg | ORAL_CAPSULE | Freq: Two times a day (BID) | ORAL | Status: DC

## 2015-11-08 NOTE — Patient Instructions (Signed)

## 2015-11-08 NOTE — Progress Notes (Signed)
   Subjective:    Patient ID: Austin Graham, male    DOB: [birth year], 65 y.o.   MRN: [id number]  HPI    Review of Systems     Objective:   Physical Exam        Assessment & Plan:

## 2015-11-08 NOTE — Progress Notes (Signed)
   Subjective:    Patient ID: Austin Graham, male    DOB: 26-Aug-1951, 65 y.o.   MRN: [id number]  Cough This is a new problem. The current episode started in the past 7 days. The problem has been gradually worsening. The problem occurs every few minutes. The cough is non-productive. Associated symptoms include chills, a fever, headaches, myalgias, nasal congestion, rhinorrhea, a sore throat, shortness of breath and wheezing. Pertinent negatives include no ear congestion or ear pain. The symptoms are aggravated by lying down. He has tried rest and OTC cough suppressant for the symptoms. The treatment provided mild relief. His past medical history is significant for asthma. There is no history of COPD.  Fever  The problem has been rapidly worsening. The maximum temperature noted was 102 to 102.9 F. Associated symptoms include coughing, headaches, a sore throat and wheezing. Pertinent negatives include no ear pain.   *Pt states he had flu vaccine in November.    Review of Systems  Constitutional: Positive for fever and chills.  HENT: Positive for rhinorrhea and sore throat. Negative for ear pain.   Respiratory: Positive for cough, shortness of breath and wheezing.   Cardiovascular: Negative.   Gastrointestinal: Negative.   Endocrine: Negative.   Genitourinary: Negative.   Musculoskeletal: Positive for myalgias.  Neurological: Positive for headaches.  Hematological: Negative.   Psychiatric/Behavioral: Negative.   All other systems reviewed and are negative.      Objective:   Physical Exam  Constitutional: He is oriented to person, place, and time. He appears well-developed and well-nourished. No distress.  HENT:  Head: Normocephalic.  Right Ear: External ear normal.  Left Ear: External ear normal.  Mouth/Throat: Oropharynx is clear and moist.  Nasal passage erythemas with mild swelling  Oropharynx erythemas  Eyes: Pupils are equal, round, and reactive to light. Right eye exhibits  no discharge. Left eye exhibits no discharge.  Neck: Normal range of motion. Neck supple. No thyromegaly present.  Cardiovascular: Normal rate, regular rhythm, normal heart sounds and intact distal pulses.   No murmur heard. Pulmonary/Chest: Effort normal and breath sounds normal. No respiratory distress. He has no wheezes.  Abdominal: Soft. Bowel sounds are normal. He exhibits no distension. There is no tenderness.  Musculoskeletal: Normal range of motion. He exhibits no edema or tenderness.  Neurological: He is alert and oriented to person, place, and time. He has normal reflexes. No cranial nerve deficit.  Skin: Skin is warm and dry. No rash noted. No erythema.  Psychiatric: He has a normal mood and affect. His behavior is normal. Judgment and thought content normal.  Vitals reviewed.     BP 108/73 mmHg  Pulse 100  Temp(Src) 96.9 F (36.1 C) (Oral)  Ht 6' (1.829 m)  Wt 210 lb (95.255 kg)  BMI 28.47 kg/m2     Assessment & Plan:  1. Flu-like symptoms - POCT Influenza A/B - oseltamivir (TAMIFLU) 75 MG capsule; Take 1 capsule (75 mg total) by mouth 2 (two) times daily.  Dispense: 10 capsule; Refill: 0  2. Influenza A Rest -Force fluids -Tylenol prn for fever and pain Good hand hygiene -RTO prn - oseltamivir (TAMIFLU) 75 MG capsule; Take 1 capsule (75 mg total) by mouth 2 (two) times daily.  Dispense: 10 capsule; Refill: 0  Evelina Dun, FNP

## 2015-11-20 ENCOUNTER — Ambulatory Visit (INDEPENDENT_AMBULATORY_CARE_PROVIDER_SITE_OTHER): Payer: BLUE CROSS/BLUE SHIELD | Admitting: Family Medicine

## 2015-11-20 ENCOUNTER — Encounter: Payer: Self-pay | Admitting: Family Medicine

## 2015-11-20 VITALS — BP 115/72 | HR 96 | Temp 97.3°F | Ht 72.0 in | Wt 209.0 lb

## 2015-11-20 DIAGNOSIS — E559 Vitamin D deficiency, unspecified: Secondary | ICD-10-CM | POA: Diagnosis not present

## 2015-11-20 DIAGNOSIS — N429 Disorder of prostate, unspecified: Secondary | ICD-10-CM | POA: Diagnosis not present

## 2015-11-20 DIAGNOSIS — Z9660 Presence of unspecified orthopedic joint implant: Secondary | ICD-10-CM | POA: Diagnosis not present

## 2015-11-20 DIAGNOSIS — E785 Hyperlipidemia, unspecified: Secondary | ICD-10-CM | POA: Diagnosis not present

## 2015-11-20 DIAGNOSIS — N4289 Other specified disorders of prostate: Secondary | ICD-10-CM

## 2015-11-20 DIAGNOSIS — K219 Gastro-esophageal reflux disease without esophagitis: Secondary | ICD-10-CM | POA: Diagnosis not present

## 2015-11-20 DIAGNOSIS — I10 Essential (primary) hypertension: Secondary | ICD-10-CM

## 2015-11-20 DIAGNOSIS — Z96641 Presence of right artificial hip joint: Secondary | ICD-10-CM

## 2015-11-20 DIAGNOSIS — N400 Enlarged prostate without lower urinary tract symptoms: Secondary | ICD-10-CM | POA: Diagnosis not present

## 2015-11-20 DIAGNOSIS — E119 Type 2 diabetes mellitus without complications: Secondary | ICD-10-CM

## 2015-11-20 DIAGNOSIS — K449 Diaphragmatic hernia without obstruction or gangrene: Secondary | ICD-10-CM

## 2015-11-20 DIAGNOSIS — R079 Chest pain, unspecified: Secondary | ICD-10-CM

## 2015-11-20 LAB — POCT GLYCOSYLATED HEMOGLOBIN (HGB A1C): HEMOGLOBIN A1C: 8.4

## 2015-11-20 NOTE — Progress Notes (Signed)
Subjective:    Patient ID: Austin Graham, male    DOB: [birth year], 65 y.o.   MRN: [id number]  HPI Pt here for follow up and management of chronic medical problems which includes diabetes and hyperlipidemia. He is taking medications regularly. The patient is doing well overall. His history is significant in that he had his right hip replaced this past fall. He is complaining of some upper back and chest discomfort and he had an EKG by a provider in the office. This started with exercise. He is due to return an FOBT and get lab work today. EKG was normal. The patient has recently had the flu with a lot of cough and congestion. He also started her [place] program [place]. He attributes this to increased exercise and using the elliptical that may be adding to his chest and back pain. He has refrained from using this recently. His cardiology appointment is not until March 13 and this is the earliest that we can get this. He denies any shortness of breath nausea vomiting diarrhea blood in the stool or black tarry bowel movements. He is voiding without problems and has a follow-up appointment with his urologist in April.     Patient Active Problem List   Diagnosis Date Noted  . Status post right hip replacement 08/07/2015  . OA (osteoarthritis) of hip 06/12/2015  . Prostate lump 12/25/2013  . Hypertension 11/30/2013  . Hyperlipidemia 11/30/2013  . Multinodular goiter 11/30/2013  . Nephrolithiasis 11/30/2013  . BPH (benign prostatic hyperplasia) 11/30/2013  . Degenerative disc disease, lumbar 11/30/2013  . Gastroesophageal reflux disease with hiatal hernia 11/30/2013  . Diabetes, poorly controlled    Outpatient Encounter Prescriptions as of 11/20/2015  Medication Sig  . aspirin 81 MG tablet Take by mouth.  Marland Kitchen atorvastatin (LIPITOR) 80 MG tablet TAKE 1 TABLET (80 MG TOTAL) BY MOUTH DAILY. AS DIRECTED  . glucose blood (ONE TOUCH ULTRA TEST) test strip Check BS QD and PRN. E11.9  .  metFORMIN (GLUCOPHAGE-XR) 500 MG 24 hr tablet TAKE 1 TABLET (500 MG TOTAL) BY MOUTH 2 (TWO) TIMES DAILY. (Patient taking differently: TAKE 1 TABLET (500 MG TOTAL) BY MOUTH 3 (Three) TIMES DAILY.)  . olmesartan (BENICAR) 20 MG tablet Take 10 mg by mouth daily.   . Omega-3 1000 MG CAPS Take by mouth. Patient takes two daily.  Marland Kitchen oseltamivir (TAMIFLU) 75 MG capsule Take 1 capsule (75 mg total) by mouth 2 (two) times daily.  . Vitamins/Minerals TABS Take by mouth.   No facility-administered encounter medications on file as of 11/20/2015.      Review of Systems  Constitutional: Negative.   HENT: Negative.   Eyes: Negative.   Respiratory: Positive for chest tightness (and upper back pain  - sch'd for cardiology).   Cardiovascular: Negative.   Gastrointestinal: Negative.   Endocrine: Negative.   Genitourinary: Negative.   Musculoskeletal: Negative.   Skin: Negative.   Allergic/Immunologic: Negative.   Neurological: Negative.   Hematological: Negative.   Psychiatric/Behavioral: Negative.        Objective:   Physical Exam  Constitutional: He is oriented to person, place, and time. He appears well-developed and well-nourished. No distress.  HENT:  Head: Normocephalic and atraumatic.  Right Ear: External ear normal.  Left Ear: External ear normal.  Nose: Nose normal.  Mouth/Throat: Oropharynx is clear and moist. No oropharyngeal exudate.  Eyes: Conjunctivae and EOM are normal. Pupils are equal, round, and reactive to light. Right eye exhibits no discharge. Left eye  exhibits no discharge. No scleral icterus.  Neck: Normal range of motion. Neck supple. No thyromegaly present.  Cardiovascular: Normal rate, regular rhythm, normal heart sounds and intact distal pulses.   No murmur heard. Pulmonary/Chest: Effort normal and breath sounds normal. No respiratory distress. He has no wheezes. He has no rales. He exhibits no tenderness.  Axillary negative  Abdominal: Soft. Bowel sounds are normal.  He exhibits no mass. There is no tenderness. There is no rebound and no guarding.  No liver or spleen enlargement and no inguinal adenopathy  Genitourinary:  This exam is to be done by the patient's urologist because of BPH in April and he has an appointment for this. We will go ahead and do his PSA test today as a preliminary to that visit  Musculoskeletal: Normal range of motion. He exhibits no edema or tenderness.  Lymphadenopathy:    He has no cervical adenopathy.  Neurological: He is alert and oriented to person, place, and time. He has normal reflexes. No cranial nerve deficit.  Skin: Skin is warm and dry. No rash noted.  Psychiatric: He has a normal mood and affect. His behavior is normal. Judgment and thought content normal.  Nursing note and vitals reviewed.   BP 115/72 mmHg  Pulse 96  Temp(Src) 97.3 F (36.3 C) (Oral)  Ht 6' (1.829 m)  Wt 209 lb (94.802 kg)  BMI 28.34 kg/m2  EKG: Within normal limits      Assessment & Plan:  1. Type 2 diabetes mellitus without complication, without long-term current use of insulin ([place]) -The patient should continue with current treatment pending results of lab work -It is important for the patient to get a good eye exam yearly and he understands this. - POCT glycosylated hemoglobin (Hb A1C) - BMP8+EGFR - CBC with Differential/Platelet  2. Hyperlipidemia -Continue with current treatment and exercise as tolerated pending results of lab work - CBC with Differential/Platelet - NMR, lipoprofile  3. BPH (benign prostatic hyperplasia) -Follow-up with urology as planned - CBC with Differential/Platelet  4. Essential hypertension -The blood pressure is good today and he will continue with current treatment - BMP8+EGFR - CBC with Differential/Platelet - Hepatic function panel  5. Vitamin D deficiency -Continue with current treatment pending results of lab work - CBC with Differential/Platelet - VITAMIN D 25 Hydroxy (Vit-D  Deficiency, Fractures)  6. Gastroesophageal reflux disease with hiatal hernia -He is having no symptoms with reflux at the current time. - CBC with Differential/Platelet - Hepatic function panel - NMR, lipoprofile  7. Status post right hip replacement -Follow-up with orthopedist as planned  8. Chest pain, unspecified chest pain type -EKG today and follow-up with cardiology visit as planned, emergency room if needed sooner  9. Prostate lump -Follow-up with urology  Patient Instructions  Continue current medications. Continue good therapeutic lifestyle changes which include good diet and exercise. Fall precautions discussed with patient. If an FOBT was given today- please return it to our front desk. If you are over 90 years old - you may need Prevnar 65 or the adult Pneumonia vaccine.  **Flu shots are available--- please call and schedule a FLU-CLINIC appointment**  After your visit with [place] today you will receive a survey in the mail or online from [employer] regarding your care with [place]. Please take a moment to fill this out. Your feedback is very important to [place] as you can help [place] better understand your patient needs as well as improve your experience and satisfaction. WE CARE ABOUT YOU!!!  it is very important that you get an eye exam on a yearly basis  If you develop more severe chest pain you should go to the emergency room immediately  In the meantime refrain from exercise equipment until you see the cardiologist  Continue to monitor blood sugars at home  Continue to use Mucinex maximum strength plain, blue and white in color one twice daily with a large glass of water  Continue to use a cool mist humidifier in the bedroom at nighttime if possible  Drink plenty of fluids   Arrie Senate MD

## 2015-11-20 NOTE — Patient Instructions (Addendum)
Continue current medications. Continue good therapeutic lifestyle changes which include good diet and exercise. Fall precautions discussed with patient. If an FOBT was given today- please return it to our front desk. If you are over 63 years old - you may need Prevnar 72 or the adult Pneumonia vaccine.  **Flu shots are available--- please call and schedule a FLU-CLINIC appointment**  After your visit with [place] today you will receive a survey in the mail or online from [employer] regarding your care with [place]. Please take a moment to fill this out. Your feedback is very important to [place] as you can help [place] better understand your patient needs as well as improve your experience and satisfaction. WE CARE ABOUT YOU!!!    it is very important that you get an eye exam on a yearly basis  If you develop more severe chest pain you should go to the emergency room immediately  In the meantime refrain from exercise equipment until you see the cardiologist  Continue to monitor blood sugars at home  Continue to use Mucinex maximum strength plain, blue and white in color one twice daily with a large glass of water  Continue to use a cool mist humidifier in the bedroom at nighttime if possible  Drink plenty of fluids

## 2015-11-21 LAB — CBC WITH DIFFERENTIAL/PLATELET
BASOS: 1 %
Basophils Absolute: 0 10*3/uL (ref 0.0–0.2)
EOS (ABSOLUTE): 0.1 10*3/uL (ref 0.0–0.4)
Eos: 2 %
HEMATOCRIT: 38.8 % (ref 37.5–51.0)
Hemoglobin: 13.5 g/dL (ref 12.6–17.7)
IMMATURE GRANS (ABS): 0 10*3/uL (ref 0.0–0.1)
IMMATURE GRANULOCYTES: 0 %
LYMPHS: 33 %
Lymphocytes Absolute: 2.2 10*3/uL (ref 0.7–3.1)
MCH: 30.3 pg (ref 26.6–33.0)
MCHC: 34.8 g/dL (ref 31.5–35.7)
MCV: 87 fL (ref 79–97)
MONOCYTES: 4 %
MONOS ABS: 0.3 10*3/uL (ref 0.1–0.9)
NEUTROS ABS: 4 10*3/uL (ref 1.4–7.0)
Neutrophils: 60 %
Platelets: 320 10*3/uL (ref 150–379)
RBC: 4.46 x10E6/uL (ref 4.14–5.80)
RDW: 14.5 % (ref 12.3–15.4)
WBC: 6.7 10*3/uL (ref 3.4–10.8)

## 2015-11-21 LAB — NMR, LIPOPROFILE
Cholesterol: 131 mg/dL (ref 100–199)
HDL Cholesterol by NMR: 32 mg/dL — ABNORMAL LOW (ref >39)
HDL Particle Number: 24.5 umol/L — ABNORMAL LOW (ref >=30.5)
LDL Particle Number: 937 nmol/L (ref <1000)
LDL SIZE: 19.8 nm (ref >20.5)
LDL-C: 54 mg/dL (ref 0–99)
LP-IR Score: 81 — ABNORMAL HIGH (ref <=45)
SMALL LDL PARTICLE NUMBER: 704 nmol/L — AB (ref <=527)
Triglycerides by NMR: 227 mg/dL — ABNORMAL HIGH (ref 0–149)

## 2015-11-21 LAB — HEPATIC FUNCTION PANEL
ALT: 25 IU/L (ref 0–44)
AST: 22 IU/L (ref 0–40)
Albumin: 4.7 g/dL (ref 3.6–4.8)
Alkaline Phosphatase: 96 IU/L (ref 39–117)
BILIRUBIN TOTAL: 0.7 mg/dL (ref 0.0–1.2)
BILIRUBIN, DIRECT: 0.17 mg/dL (ref 0.00–0.40)
Total Protein: 6.8 g/dL (ref 6.0–8.5)

## 2015-11-21 LAB — BMP8+EGFR
BUN / CREAT RATIO: 19 (ref 10–22)
BUN: 17 mg/dL (ref 8–27)
CALCIUM: 9.9 mg/dL (ref 8.6–10.2)
CHLORIDE: 99 mmol/L (ref 96–106)
CO2: 21 mmol/L (ref 18–29)
Creatinine, Ser: 0.89 mg/dL (ref 0.76–1.27)
GFR calc Af Amer: 105 mL/min/{1.73_m2} (ref >59)
GFR calc non Af Amer: 91 mL/min/{1.73_m2} (ref >59)
GLUCOSE: 147 mg/dL — AB (ref 65–99)
Potassium: 4.5 mmol/L (ref 3.5–5.2)
Sodium: 140 mmol/L (ref 134–144)

## 2015-11-21 LAB — VITAMIN D 25 HYDROXY (VIT D DEFICIENCY, FRACTURES): VIT D 25 HYDROXY: 55.3 ng/mL (ref 30.0–100.0)

## 2015-11-21 LAB — PSA, TOTAL AND FREE
PROSTATE SPECIFIC AG, SERUM: 0.6 ng/mL (ref 0.0–4.0)
PSA, Free Pct: 21.7 %
PSA, Free: 0.13 ng/mL

## 2015-12-02 ENCOUNTER — Other Ambulatory Visit: Payer: Self-pay | Admitting: Family Medicine

## 2015-12-04 ENCOUNTER — Ambulatory Visit: Payer: BLUE CROSS/BLUE SHIELD | Admitting: Family Medicine

## 2015-12-05 ENCOUNTER — Ambulatory Visit: Payer: BLUE CROSS/BLUE SHIELD | Admitting: Cardiology

## 2015-12-08 NOTE — Progress Notes (Signed)
Cardiology Office Note   Date:  12/09/2015   ID:  Khader Burgy, DOB Jul 11, 1951, MRN [id number]  PCP:  Redge Gainer, MD  Cardiologist:   Minus Breeding, MD   Chief Complaint  Patient presents with  . New Evaluation    Pt c/o chest discomfort and SOB  . Chest Pain      History of Present Illness: Austin Graham is a 65 y.o. male who presents for evaluation of chest discomfort. He was seen in 2009 for chest pain and had a treadmill test and I saw him a little bit more than 3 years ago when he had chest discomfort again. Stress testing was negative. Last year he had his hip replaced. He didn't have any problems with this. However, he hasn't been exercising as much. When he started doing some exercising recently he did some weights. He developed some chest discomfort afterwards. He's noticed this now for the past 6 weeks although its slowly resolving. It started out as a sharp discomfort. Seemed to be in his mid chest and in his back. It was deep. Seemed to be a little bit worse with movement. It resolved to a dull ache. He did not have jaw discomfort. He might have a little discomfort in his left upper arm. He's not had any associated symptoms such as shortness of breath, nausea or vomiting. He might have more dyspnea walking up stairs.  Past Medical History  Diagnosis Date  . Kidney stones   . Hypertension   . Hyperlipidemia   . Thyroid disease   . Stress fracture     left knee, right hip  . Complication of anesthesia     hard to waken  . Dysrhythmia     PAC's  . Asthma     as child  . Goiter   . Diabetes ([place])     type II  . History of hiatal hernia   . Arthritis     Past Surgical History  Procedure Laterality Date  . Kidney stones  2006  . Tonsillectomy    . Wisdom tooth extraction    . 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 Joint replacement       Current Outpatient Prescriptions  Medication Sig Dispense Refill  . aspirin 81 MG tablet Take by mouth.    Marland Kitchen atorvastatin (LIPITOR) 40 MG tablet Take 40 mg by mouth daily.    Marland Kitchen glucose blood (ONE TOUCH ULTRA TEST) test strip Check BS QD and PRN. E11.9 100 each 11  . metFORMIN (GLUCOPHAGE-XR) 500 MG 24 hr tablet TAKE 1 TABLET (500 MG TOTAL) BY MOUTH 2 (TWO) TIMES DAILY. (Patient taking differently: TAKE 1 TABLET (500 MG TOTAL) BY MOUTH 3 (Three) TIMES DAILY.) 60 tablet 0  . metFORMIN (GLUCOPHAGE-XR) 500 MG 24 hr tablet TAKE 1 TABLET BY MOUTH TWICE A DAY 180 tablet 2  . olmesartan (BENICAR) 20 MG tablet Take 10 mg by mouth daily.     . Omega-3 1000 MG CAPS Take by mouth. Patient takes two daily.    Marland Kitchen oseltamivir (TAMIFLU) 75 MG capsule Take 1 capsule (75 mg total) by mouth 2 (two) times daily. 10 capsule 0  . Vitamins/Minerals TABS Take by mouth.     No current facility-administered medications for this visit.    Allergies:   Codeine; Erythromycin; Latex; and Other  Social History:  The patient  reports that he quit smoking about 41 years ago. He has never used smokeless tobacco. He reports that he drinks alcohol. He reports that he uses illicit drugs (Marijuana).   Family History:  The patient's family history includes CAD (age of onset: 55) in his father; Cancer in his sister; Cancer (age of onset: 51) in his mother; Diabetes in his sister; Stroke in his father.    ROS:  Please see the history of present illness.   Otherwise, review of systems are positive for none.   All other systems are reviewed and negative.    PHYSICAL EXAM: VS:  BP 140/86 mmHg  Pulse 83  Ht 6' (1.829 m)  Wt 213 lb (96.616 kg)  BMI 28.88 kg/m2 , BMI Body mass index is 28.88 kg/(m^2). GENERAL:  Well appearing HEENT:  Pupils equal round and reactive, fundi not visualized, oral mucosa unremarkable NECK:  No jugular venous distention, waveform within normal  limits, carotid upstroke brisk and symmetric, no bruits, no thyromegaly LYMPHATICS:  No cervical, inguinal adenopathy LUNGS:  Clear to auscultation bilaterally BACK:  No CVA tenderness CHEST:  Unremarkable HEART:  PMI not displaced or sustained,S1 and S2 within normal limits, no S3, no S4, no clicks, no rubs, no murmurs ABD:  Flat, positive bowel sounds normal in frequency in pitch, no bruits, no rebound, no guarding, no midline pulsatile mass, no hepatomegaly, no splenomegaly EXT:  2 plus pulses throughout, no edema, no cyanosis no clubbing SKIN:  No rashes no nodules NEURO:  Cranial nerves II through XII grossly intact, motor grossly intact throughout PSYCH:  Cognitively intact, oriented to person place and time    EKG:  EKG is ordered today. The ekg ordered today demonstrates sinus rhythm, rate 83, axis within normal limits, intervals within normal limits, no acute ST-T wave changes.   Recent Labs: 06/13/2015: Hemoglobin 10.9* 11/20/2015: ALT 25; BUN 17; Creatinine, Ser 0.89; Platelets 320; Potassium 4.5; Sodium 140    Lipid Panel    Component Value Date/Time   CHOL 131 11/20/2015 1120   CHOL 137 11/22/2013 1232   TRIG 227* 11/20/2015 1120   TRIG 192* 11/22/2013 1232   HDL 32* 11/20/2015 1120   HDL 33* 11/22/2013 1232   LDLCALC 86 07/04/2014 0955   LDLCALC 66 11/22/2013 1232      Wt Readings from Last 3 Encounters:  12/09/15 213 lb (96.616 kg)  11/20/15 209 lb (94.802 kg)  11/08/15 210 lb (95.255 kg)      Other studies Reviewed: Additional studies/ records that were reviewed today include: None. Review of the above records demonstrates:  Please see elsewhere in the note.     ASSESSMENT AND PLAN:  CHEST PAIN:  His chest pain is atypical. However, he has significant cardiovascular risk factors. I will bring the patient back for a POET (Plain Old Exercise Test). This will allow me to screen for obstructive coronary disease, risk stratify and very importantly provide a  prescription for exercise.  DM:  His sugar is not well controlled currently and it has been when he's been on diet and exercise. We had a long conversation about this.  HTN:  The blood pressure is at target. No change in medications is indicated. We will continue with therapeutic lifestyle changes (TLC).    Current medicines are reviewed at length with the patient today.  The patient does not have concerns regarding medicines.  The following changes have been made:  no change  Labs/ tests ordered today  include:   Orders Placed This Encounter  Procedures  . Exercise Tolerance Test  . EKG 12-Lead     Disposition:   FU with me as needed.      Signed, Minus Breeding, MD  12/09/2015 12:31 PM    [facility]

## 2015-12-09 ENCOUNTER — Ambulatory Visit (INDEPENDENT_AMBULATORY_CARE_PROVIDER_SITE_OTHER): Payer: BLUE CROSS/BLUE SHIELD | Admitting: Cardiology

## 2015-12-09 ENCOUNTER — Encounter: Payer: Self-pay | Admitting: Cardiology

## 2015-12-09 VITALS — BP 140/86 | HR 83 | Ht 72.0 in | Wt 213.0 lb

## 2015-12-09 DIAGNOSIS — R079 Chest pain, unspecified: Secondary | ICD-10-CM

## 2015-12-09 NOTE — Patient Instructions (Signed)
Your physician recommends that you schedule a follow-up appointment in: As Needed  Your physician has requested that you have an exercise tolerance test. For further information please visit [email or web address]. Please also follow instruction sheet, as given.    

## 2015-12-27 ENCOUNTER — Telehealth (HOSPITAL_COMMUNITY): Payer: Self-pay

## 2015-12-27 NOTE — Telephone Encounter (Signed)
Encounter complete. 

## 2016-01-01 ENCOUNTER — Ambulatory Visit (HOSPITAL_COMMUNITY)
Admission: RE | Admit: 2016-01-01 | Discharge: 2016-01-01 | Disposition: A | Discharge status: Home / Self Care | Payer: BLUE CROSS/BLUE SHIELD | Source: Ambulatory Visit | Attending: Cardiology | Admitting: Cardiology

## 2016-01-01 DIAGNOSIS — R079 Chest pain, unspecified: Secondary | ICD-10-CM

## 2016-01-01 LAB — EXERCISE TOLERANCE TEST
CHL CUP MPHR: 157 {beats}/min
CHL RATE OF PERCEIVED EXERTION: 16
CSEPEW: 10.1 METS
CSEPPHR: 169 {beats}/min
Exercise duration (min): 9 min
Percent HR: 107 %
Rest HR: 98 {beats}/min

## 2016-02-21 ENCOUNTER — Ambulatory Visit (INDEPENDENT_AMBULATORY_CARE_PROVIDER_SITE_OTHER): Payer: BLUE CROSS/BLUE SHIELD | Admitting: Family Medicine

## 2016-02-21 ENCOUNTER — Encounter: Payer: Self-pay | Admitting: Family Medicine

## 2016-02-21 VITALS — BP 121/77 | HR 121 | Temp 98.3°F | Ht 72.0 in | Wt 208.2 lb

## 2016-02-21 DIAGNOSIS — J201 Acute bronchitis due to Hemophilus influenzae: Secondary | ICD-10-CM | POA: Diagnosis not present

## 2016-02-21 MED ORDER — BETAMETHASONE SOD PHOS & ACET 6 (3-3) MG/ML IJ SUSP
6.0000 mg | Freq: Once | INTRAMUSCULAR | Status: AC
  Administered 2016-02-21: 6 mg via INTRAMUSCULAR

## 2016-02-21 MED ORDER — LEVOFLOXACIN 500 MG PO TABS: 500.0000 mg | ORAL_TABLET | Freq: Every day | ORAL | Status: DC

## 2016-02-21 NOTE — Progress Notes (Addendum)
Subjective:  Patient ID: Austin Graham, male    DOB: [birth year]  Age: 65 y.o. MRN: [id number]  CC: URI   HPI Austin Graham presents for Patient presents with upper respiratory congestion. Rhinorrhea that is frequently purulent. There is moderate sore throat. Patient reports coughing frequently as well.-colored/purulent sputum noted. There is no fever no chills no sweats. The patient denies being short of breath. Onset was 3-5 days ago. Gradually worsening in spite of home remedies.    History Austin Graham has a past medical history of Kidney stones; Hypertension; Hyperlipidemia; Thyroid disease; Stress fracture; Complication of anesthesia; Dysrhythmia; Asthma; Goiter; Diabetes ([place]); History of hiatal hernia; and Arthritis.   He has past surgical history that includes KIDNEY STONES (2006); Tonsillectomy; Wisdom tooth extraction; Total hip arthroplasty (Right, 06/12/2015); and Joint replacement.   His family history includes CAD (age of onset: 36) in his father; Cancer in his sister; Cancer (age of onset: 63) in his mother; Diabetes in his sister; Stroke in his father.He reports that he quit smoking about 41 years ago. He has never used smokeless tobacco. He reports that he drinks alcohol. He reports that he uses illicit drugs (Marijuana).    ROS Review of Systems  Constitutional: Positive for fever, chills, diaphoresis, activity change and fatigue. Negative for appetite change.  HENT: Positive for congestion, postnasal drip, rhinorrhea and sinus pressure. Negative for ear discharge, ear pain, hearing loss, nosebleeds, sneezing and trouble swallowing.   Respiratory: Positive for cough and shortness of breath. Negative for chest tightness.   Cardiovascular: Negative for chest pain and palpitations.  Skin: Negative for rash.    Objective:  BP 121/77 mmHg  Pulse 121  Temp(Src) 98.3 F (36.8 C) (Oral)  Ht 6' (1.829 m)  Wt 208 lb 4 oz (94.462 kg)  BMI 28.24 kg/m2  BP Readings from  Last 3 Encounters:  02/21/16 121/77  12/09/15 140/86  11/20/15 115/72    Wt Readings from Last 3 Encounters:  02/21/16 208 lb 4 oz (94.462 kg)  12/09/15 213 lb (96.616 kg)  11/20/15 209 lb (94.802 kg)     Physical Exam  Constitutional: He appears well-developed and well-nourished. He appears distressed (mild).  HENT:  Head: Normocephalic and atraumatic.  Right Ear: Tympanic membrane and external ear normal. No decreased hearing is noted.  Left Ear: Tympanic membrane and external ear normal. No decreased hearing is noted.  Nose: Mucosal edema present. Right sinus exhibits no frontal sinus tenderness. Left sinus exhibits no frontal sinus tenderness.  Mouth/Throat: No oropharyngeal exudate or posterior oropharyngeal erythema.  Neck: Normal range of motion. Neck supple. No Brudzinski's sign noted.  Pulmonary/Chest: No respiratory distress. He has wheezes (coarse, insp & exp all fields).  Lymphadenopathy:       Head (right side): No preauricular adenopathy present.       Head (left side): No preauricular adenopathy present.       Right cervical: No superficial cervical adenopathy present.      Left cervical: No superficial cervical adenopathy present.     Lab Results  Component Value Date   WBC 6.7 11/20/2015   HGB 10.9* 06/13/2015   HCT 38.8 11/20/2015   PLT 320 11/20/2015   GLUCOSE 147* 11/20/2015   CHOL 131 11/20/2015   TRIG 227* 11/20/2015   HDL 32* 11/20/2015   LDLCALC 86 07/04/2014   ALT 25 11/20/2015   AST 22 11/20/2015   NA 140 11/20/2015   K 4.5 11/20/2015   CL 99 11/20/2015   CREATININE 0.89 11/20/2015  BUN 17 11/20/2015   CO2 21 11/20/2015   PSA 0.4 12/27/2014   INR 0.99 06/05/2015   HGBA1C 8.4 11/20/2015    No results found.  Assessment & Plan:   Austin Graham was seen today for uri.  Diagnoses and all orders for this visit:  Acute bronchitis due to Haemophilus influenzae -     betamethasone acetate-betamethasone sodium phosphate (CELESTONE) injection  6 mg; Inject 1 mL (6 mg total) into the muscle once. -     CBC with Differential/Platelet -     CMP14+EGFR  Other orders -     levofloxacin (LEVAQUIN) 500 MG tablet; Take 1 tablet (500 mg total) by mouth daily. For 10 days    I have discontinued Austin Graham's oseltamivir. I am also having him start on levofloxacin. Additionally, I am having him maintain his metFORMIN, glucose blood, olmesartan, aspirin, Vitamins/Minerals, Omega-3, and atorvastatin. We will continue to administer betamethasone acetate-betamethasone sodium phosphate.  Meds ordered this encounter  Medications  . betamethasone acetate-betamethasone sodium phosphate (CELESTONE) injection 6 mg    Sig:   . levofloxacin (LEVAQUIN) 500 MG tablet    Sig: Take 1 tablet (500 mg total) by mouth daily. For 10 days    Dispense:  10 tablet    Refill:  0     Follow-up: Return if symptoms worsen or fail to improve.  Claretta Fraise, M.D.

## 2016-02-21 NOTE — Addendum Note (Signed)
Addended by: Claretta Fraise on: 02/21/2016 05:02 PM   Modules accepted: Orders, SmartSet

## 2016-02-21 NOTE — Addendum Note (Signed)
Addended by: Wardell Heath on: 02/21/2016 05:04 PM   Modules accepted: Miquel Dunn

## 2016-02-22 LAB — CBC WITH DIFFERENTIAL/PLATELET
BASOS: 0 %
Basophils Absolute: 0 10*3/uL (ref 0.0–0.2)
EOS (ABSOLUTE): 0.1 10*3/uL (ref 0.0–0.4)
EOS: 3 %
HEMATOCRIT: 40.1 % (ref 37.5–51.0)
Hemoglobin: 13.9 g/dL (ref 12.6–17.7)
IMMATURE GRANS (ABS): 0 10*3/uL (ref 0.0–0.1)
IMMATURE GRANULOCYTES: 0 %
LYMPHS: 22 %
Lymphocytes Absolute: 1 10*3/uL (ref 0.7–3.1)
MCH: 30.9 pg (ref 26.6–33.0)
MCHC: 34.7 g/dL (ref 31.5–35.7)
MCV: 89 fL (ref 79–97)
MONOS ABS: 0.5 10*3/uL (ref 0.1–0.9)
Monocytes: 11 %
NEUTROS PCT: 64 %
Neutrophils Absolute: 3.1 10*3/uL (ref 1.4–7.0)
Platelets: 232 10*3/uL (ref 150–379)
RBC: 4.5 x10E6/uL (ref 4.14–5.80)
RDW: 13.2 % (ref 12.3–15.4)
WBC: 4.8 10*3/uL (ref 3.4–10.8)

## 2016-02-22 LAB — CMP14+EGFR
A/G RATIO: 2.2 (ref 1.2–2.2)
ALK PHOS: 90 IU/L (ref 39–117)
ALT: 36 IU/L (ref 0–44)
AST: 20 IU/L (ref 0–40)
Albumin: 4.8 g/dL (ref 3.6–4.8)
BUN/Creatinine Ratio: 12 (ref 10–24)
BUN: 15 mg/dL (ref 8–27)
Bilirubin Total: 0.8 mg/dL (ref 0.0–1.2)
CO2: 20 mmol/L (ref 18–29)
Calcium: 9.8 mg/dL (ref 8.6–10.2)
Chloride: 97 mmol/L (ref 96–106)
Creatinine, Ser: 1.26 mg/dL (ref 0.76–1.27)
GFR calc Af Amer: 70 mL/min/{1.73_m2} (ref >59)
GFR calc non Af Amer: 60 mL/min/{1.73_m2} (ref >59)
GLOBULIN, TOTAL: 2.2 g/dL (ref 1.5–4.5)
Glucose: 237 mg/dL — ABNORMAL HIGH (ref 65–99)
POTASSIUM: 4.1 mmol/L (ref 3.5–5.2)
SODIUM: 137 mmol/L (ref 134–144)
Total Protein: 7 g/dL (ref 6.0–8.5)

## 2016-04-01 ENCOUNTER — Encounter: Payer: Self-pay | Admitting: Family Medicine

## 2016-04-01 ENCOUNTER — Ambulatory Visit (INDEPENDENT_AMBULATORY_CARE_PROVIDER_SITE_OTHER): Payer: BLUE CROSS/BLUE SHIELD | Admitting: Family Medicine

## 2016-04-01 ENCOUNTER — Ambulatory Visit (INDEPENDENT_AMBULATORY_CARE_PROVIDER_SITE_OTHER): Payer: BLUE CROSS/BLUE SHIELD

## 2016-04-01 VITALS — BP 122/77 | HR 82 | Temp 96.6°F | Ht 72.0 in | Wt 209.0 lb

## 2016-04-01 DIAGNOSIS — K219 Gastro-esophageal reflux disease without esophagitis: Secondary | ICD-10-CM

## 2016-04-01 DIAGNOSIS — K449 Diaphragmatic hernia without obstruction or gangrene: Secondary | ICD-10-CM

## 2016-04-01 DIAGNOSIS — I10 Essential (primary) hypertension: Secondary | ICD-10-CM

## 2016-04-01 DIAGNOSIS — N400 Enlarged prostate without lower urinary tract symptoms: Secondary | ICD-10-CM

## 2016-04-01 DIAGNOSIS — E119 Type 2 diabetes mellitus without complications: Secondary | ICD-10-CM | POA: Diagnosis not present

## 2016-04-01 DIAGNOSIS — Z1211 Encounter for screening for malignant neoplasm of colon: Secondary | ICD-10-CM | POA: Diagnosis not present

## 2016-04-01 DIAGNOSIS — E785 Hyperlipidemia, unspecified: Secondary | ICD-10-CM

## 2016-04-01 DIAGNOSIS — E559 Vitamin D deficiency, unspecified: Secondary | ICD-10-CM | POA: Diagnosis not present

## 2016-04-01 DIAGNOSIS — S80921A Unspecified superficial injury of right lower leg, initial encounter: Secondary | ICD-10-CM

## 2016-04-01 DIAGNOSIS — W57XXXA Bitten or stung by nonvenomous insect and other nonvenomous arthropods, initial encounter: Secondary | ICD-10-CM | POA: Diagnosis not present

## 2016-04-01 DIAGNOSIS — Z0000 Encounter for general adult medical examination without abnormal findings: Secondary | ICD-10-CM

## 2016-04-01 DIAGNOSIS — M6530 Trigger finger, unspecified finger: Secondary | ICD-10-CM

## 2016-04-01 LAB — BAYER DCA HB A1C WAIVED: HB A1C (BAYER DCA - WAIVED): 7.9 % — ABNORMAL HIGH (ref <7.0)

## 2016-04-01 MED ORDER — DOXYCYCLINE HYCLATE 100 MG PO TABS: 100.0000 mg | ORAL_TABLET | Freq: Two times a day (BID) | ORAL | Status: DC

## 2016-04-01 NOTE — Patient Instructions (Addendum)
Continue current medications. Continue good therapeutic lifestyle changes which include good diet and exercise. Fall precautions discussed with patient. If an FOBT was given today- please return it to our front desk. If you are over 63 years old - you may need Prevnar 36 or the adult Pneumonia vaccine.   After your visit with [place] today you will receive a survey in the mail or online from [employer] regarding your care with [place]. Please take a moment to fill this out. Your feedback is very important to [place] as you can help [place] better understand your patient needs as well as improve your experience and satisfaction. WE CARE ABOUT YOU!!!    Please call and schedule follow-up visit with surgeon regarding your thyroid gland Please schedule follow-up visit with urology so that you can get the ultrasound done that was recommended Please call and schedule an eye exam with [facility] eye physicians for good eye exam. Take antibiotic as directed Remember that doxycycline can cause a rash in the sun. Avoid sun exposure as much as possible and take antibiotic until completed

## 2016-04-01 NOTE — Progress Notes (Signed)
Subjective:    Patient ID: Austin Graham, male    DOB: 08/18/1951, 65 y.o.   MRN: [id number]  HPI Pt here for follow up and management of chronic medical problems which includes diabetes and hyperlipidemia. He is taking medications regularly.Patient denies chest pain shortness of breath trouble swallowing heartburn indigestion nausea vomiting diarrhea or blood in the stool. He seems to be much more upbeat since he's had his right hip replacement. He is more active and only has minimal pain with his hip. He likes to fish and he is concerned that his fifth finger on his left hand is giving him a lot of pain especially in the palmar aspect of the finger. It appears he has a trigger finger. We will schedule him with orthopedic surgeon for further injection of this and he will wear a Band-Aid at nighttime until he sees the orthopedic surgeon. The patient reminded me of having thyroid nodules in the past. He was seeing Dr. Dalbert Batman. He was told he needed to see him again in a couple years and he will need to schedule an appointment to follow up with him regarding the thyroid nodules. He also sees the urologist and he was recommended a cause of his voiding hesitancy that he have an ultrasound of the prostate. He is to call back and schedule a visit to have this taken care of.      Patient Active Problem List   Diagnosis Date Noted  . Status post right hip replacement 08/07/2015  . OA (osteoarthritis) of hip 06/12/2015  . Prostate lump 12/25/2013  . Hypertension 11/30/2013  . Hyperlipidemia 11/30/2013  . Multinodular goiter 11/30/2013  . Nephrolithiasis 11/30/2013  . BPH (benign prostatic hyperplasia) 11/30/2013  . Degenerative disc disease, lumbar 11/30/2013  . Gastroesophageal reflux disease with hiatal hernia 11/30/2013  . Diabetes, poorly controlled    Outpatient Encounter Prescriptions as of 04/01/2016  Medication Sig  . aspirin 81 MG tablet Take by mouth.  Marland Kitchen atorvastatin (LIPITOR) 40 MG  tablet Take 40 mg by mouth daily.  . Cholecalciferol (VITAMIN D) 2000 units CAPS Take 1 capsule by mouth daily.  Marland Kitchen glucose blood (ONE TOUCH ULTRA TEST) test strip Check BS QD and PRN. E11.9  . metFORMIN (GLUCOPHAGE-XR) 500 MG 24 hr tablet TAKE 1 TABLET (500 MG TOTAL) BY MOUTH 2 (TWO) TIMES DAILY. (Patient taking differently: TAKE 1 TABLET (500 MG TOTAL) BY MOUTH 3 (Three) TIMES DAILY.)  . olmesartan (BENICAR) 20 MG tablet Take 10 mg by mouth daily.   . Omega-3 1000 MG CAPS Take by mouth. Patient takes two daily.  . Vitamins/Minerals TABS Take by mouth.  . [DISCONTINUED] levofloxacin (LEVAQUIN) 500 MG tablet Take 1 tablet (500 mg total) by mouth daily. For 10 days   No facility-administered encounter medications on file as of 04/01/2016.      Review of Systems  Constitutional: Negative.   HENT: Negative.   Eyes: Negative.   Respiratory: Negative.   Cardiovascular: Negative.   Gastrointestinal: Negative.   Endocrine: Negative.   Genitourinary: Negative.   Musculoskeletal: Positive for back pain (some lower back pain) and arthralgias (left hand pain - pickie finger ).  Skin: Negative.        Right lower leg - tick   Allergic/Immunologic: Negative.   Neurological: Negative.   Hematological: Negative.   Psychiatric/Behavioral: Negative.        Objective:   Physical Exam  Constitutional: He is oriented to person, place, and time. He appears well-developed and well-nourished. No distress.  HENT:  Head: Normocephalic and atraumatic.  Right Ear: External ear normal.  Left Ear: External ear normal.  Nose: Nose normal.  Mouth/Throat: Oropharynx is clear and moist. No oropharyngeal exudate.  Eyes: Conjunctivae and EOM are normal. Pupils are equal, round, and reactive to light. Right eye exhibits no discharge. Left eye exhibits no discharge. No scleral icterus.  Neck: Normal range of motion. Neck supple. No thyromegaly present.  Cardiovascular: Normal rate, regular rhythm, normal heart  sounds and intact distal pulses.   No murmur heard. The heart has a regular rate and rhythm at 84/m  Pulmonary/Chest: Effort normal and breath sounds normal. No respiratory distress. He has no wheezes. He has no rales. He exhibits no tenderness.  's are clear anteriorly and posteriorly there is no axillary adenopathy  Abdominal: Soft. Bowel sounds are normal. He exhibits no mass. There is no tenderness. There is no rebound and no guarding.  Nontender with out masses or organ enlargement  Genitourinary:   is done yearly by the urologist in April  Musculoskeletal: Normal range of motion. He exhibits no edema or tenderness.  Range of motion of both hips is good..  Lymphadenopathy:    He has no cervical adenopathy.  Neurological: He is alert and oriented to person, place, and time. He has normal reflexes. No cranial nerve deficit.  Skin: Skin is warm and dry. Rash noted. There is erythema. No pallor.  Tick bite is on the right anterior lower leg with a rash and surrounding the bite site that is cleared in the center. There is also a rash at the left medial malleolus. This happened on June 24.  Psychiatric: He has a normal mood and affect. His behavior is normal. Judgment and thought content normal.  Nursing note and vitals reviewed.  BP 122/77 mmHg  Pulse 82  Temp(Src) 96.6 F (35.9 C) (Oral)  Ht 6' (1.829 m)  Wt 209 lb (94.802 kg)  BMI 28.34 kg/m2  [facility] reading (PRIMARY) by  DrMoore-chest x-ray with results pending                                        Assessment & Plan:  1. Type 2 diabetes mellitus without complication, without long-term current use of insulin (HCC) -Continue current treatment pending results of hemoglobin A1c -Continue aggressive therapeutic lifestyle changes with weight loss including diet and exercise - CBC with Differential/Platelet - Bayer DCA Hb A1c Waived  2. Hyperlipidemia -Continue current treatment with aggressive therapeutic lifestyle changes - DG  Chest 2 View; Future - CBC with Differential/Platelet - NMR, lipoprofile  3. BPH (benign prostatic hyperplasia) -Continue to follow-up with urology especially regarding the hesitancy and getting an ultrasound as the urologist recommended - CBC with Differential/Platelet  4. Essential hypertension -The blood pressure is good today and patient will continue with current treatment and sodium restriction - DG Chest 2 View; Future - BMP8+EGFR - CBC with Differential/Platelet - Hepatic function panel  5. Vitamin D deficiency -Continue current treatment pending results of lab work - CBC with Differential/Platelet - VITAMIN D 25 Hydroxy (Vit-D Deficiency, Fractures)  6. Gastroesophageal reflux disease with hiatal hernia -The patient has no complaints with this today. He will continue with current treatment - CBC with Differential/Platelet  7. Special screening for malignant neoplasms, colon - Fecal occult blood, imunochemical; Future  8. Tick bite -Take doxycycline as directed one twice daily with food for 3  weeks - Lyme Ab/Western Blot Reflex - Rocky mtn spotted fvr abs pnl(IgG+IgM)  9. Health care maintenance - VITAMIN D 25 Hydroxy (Vit-D Deficiency, Fractures) - Thyroid Panel With TSH  10. Trigger finger, acquired - Ambulatory referral to Hand Surgery  Patient Instructions  Continue current medications. Continue good therapeutic lifestyle changes which include good diet and exercise. Fall precautions discussed with patient. If an FOBT was given today- please return it to our front desk. If you are over 26 years old - you may need Prevnar 73 or the adult Pneumonia vaccine.   After your visit with [place] today you will receive a survey in the mail or online from [employer] regarding your care with [place]. Please take a moment to fill this out. Your feedback is very important to [place] as you can help [place] better understand your patient needs as well as improve your experience and  satisfaction. WE CARE ABOUT YOU!!!    Please call and schedule follow-up visit with surgeon regarding your thyroid gland Please schedule follow-up visit with urology so that you can get the ultrasound done that was recommended Please call and schedule an eye exam with [facility] eye physicians for good eye exam. Take antibiotic as directed Remember that doxycycline can cause a rash in the sun. Avoid sun exposure as much as possible and take antibiotic until completed    Arrie Senate MD

## 2016-04-02 LAB — CBC WITH DIFFERENTIAL/PLATELET
BASOS ABS: 0 10*3/uL (ref 0.0–0.2)
Basos: 1 %
EOS (ABSOLUTE): 0.1 10*3/uL (ref 0.0–0.4)
EOS: 3 %
HEMATOCRIT: 42.4 % (ref 37.5–51.0)
HEMOGLOBIN: 14.4 g/dL (ref 12.6–17.7)
IMMATURE GRANS (ABS): 0 10*3/uL (ref 0.0–0.1)
IMMATURE GRANULOCYTES: 0 %
LYMPHS ABS: 1.9 10*3/uL (ref 0.7–3.1)
LYMPHS: 35 %
MCH: 30.8 pg (ref 26.6–33.0)
MCHC: 34 g/dL (ref 31.5–35.7)
MCV: 91 fL (ref 79–97)
MONOCYTES: 11 %
Monocytes Absolute: 0.6 10*3/uL (ref 0.1–0.9)
NEUTROS PCT: 50 %
Neutrophils Absolute: 2.8 10*3/uL (ref 1.4–7.0)
Platelets: 261 10*3/uL (ref 150–379)
RBC: 4.67 x10E6/uL (ref 4.14–5.80)
RDW: 13.6 % (ref 12.3–15.4)
WBC: 5.5 10*3/uL (ref 3.4–10.8)

## 2016-04-02 LAB — NMR, LIPOPROFILE

## 2016-04-02 LAB — LIPID PANEL
Chol/HDL Ratio: 4.1 ratio units (ref 0.0–5.0)
Cholesterol, Total: 175 mg/dL (ref 100–199)
HDL: 43 mg/dL (ref >39)
LDL CALC: 81 mg/dL (ref 0–99)
Triglycerides: 254 mg/dL — ABNORMAL HIGH (ref 0–149)
VLDL CHOLESTEROL CAL: 51 mg/dL — AB (ref 5–40)

## 2016-04-02 LAB — BMP8+EGFR
BUN / CREAT RATIO: 19 (ref 10–24)
BUN: 19 mg/dL (ref 8–27)
CALCIUM: 9.9 mg/dL (ref 8.6–10.2)
CHLORIDE: 98 mmol/L (ref 96–106)
CO2: 22 mmol/L (ref 18–29)
CREATININE: 0.99 mg/dL (ref 0.76–1.27)
GFR calc Af Amer: 93 mL/min/{1.73_m2} (ref >59)
GFR calc non Af Amer: 81 mL/min/{1.73_m2} (ref >59)
GLUCOSE: 176 mg/dL — AB (ref 65–99)
Potassium: 4.4 mmol/L (ref 3.5–5.2)
Sodium: 138 mmol/L (ref 134–144)

## 2016-04-02 LAB — HEPATIC FUNCTION PANEL
ALBUMIN: 4.8 g/dL (ref 3.6–4.8)
ALK PHOS: 86 IU/L (ref 39–117)
ALT: 37 IU/L (ref 0–44)
AST: 23 IU/L (ref 0–40)
BILIRUBIN TOTAL: 0.8 mg/dL (ref 0.0–1.2)
Bilirubin, Direct: 0.18 mg/dL (ref 0.00–0.40)
TOTAL PROTEIN: 6.9 g/dL (ref 6.0–8.5)

## 2016-04-02 LAB — THYROID PANEL WITH TSH
FREE THYROXINE INDEX: 1.7 (ref 1.2–4.9)
T3 UPTAKE RATIO: 29 % (ref 24–39)
T4, Total: 5.9 ug/dL (ref 4.5–12.0)
TSH: 3.82 u[IU]/mL (ref 0.450–4.500)

## 2016-04-02 LAB — VITAMIN D 25 HYDROXY (VIT D DEFICIENCY, FRACTURES): Vit D, 25-Hydroxy: 42 ng/mL (ref 30.0–100.0)

## 2016-04-02 LAB — ROCKY MTN SPOTTED FVR ABS PNL(IGG+IGM)
RMSF IGG: NEGATIVE
RMSF IGM: 0.32 {index} (ref 0.00–0.89)

## 2016-04-02 LAB — NO SPECIMEN RECEIVED

## 2016-04-02 LAB — LYME AB/WESTERN BLOT REFLEX: Lyme IgG/IgM Ab: <0.91 {ISR} (ref 0.00–0.90)

## 2016-06-19 ENCOUNTER — Encounter (INDEPENDENT_AMBULATORY_CARE_PROVIDER_SITE_OTHER): Payer: BLUE CROSS/BLUE SHIELD | Admitting: *Deleted

## 2016-06-27 ENCOUNTER — Other Ambulatory Visit: Payer: Self-pay | Admitting: Family Medicine

## 2016-07-21 ENCOUNTER — Ambulatory Visit: Payer: BLUE CROSS/BLUE SHIELD

## 2016-08-03 ENCOUNTER — Ambulatory Visit: Payer: BLUE CROSS/BLUE SHIELD | Admitting: Family Medicine

## 2016-08-17 ENCOUNTER — Ambulatory Visit (INDEPENDENT_AMBULATORY_CARE_PROVIDER_SITE_OTHER): Payer: BLUE CROSS/BLUE SHIELD | Admitting: Family Medicine

## 2016-08-17 ENCOUNTER — Encounter: Payer: Self-pay | Admitting: Family Medicine

## 2016-08-17 VITALS — BP 123/70 | HR 111 | Temp 97.9°F | Ht 72.0 in | Wt 208.0 lb

## 2016-08-17 DIAGNOSIS — Z23 Encounter for immunization: Secondary | ICD-10-CM

## 2016-08-17 DIAGNOSIS — I10 Essential (primary) hypertension: Secondary | ICD-10-CM

## 2016-08-17 DIAGNOSIS — K449 Diaphragmatic hernia without obstruction or gangrene: Secondary | ICD-10-CM

## 2016-08-17 DIAGNOSIS — E119 Type 2 diabetes mellitus without complications: Secondary | ICD-10-CM | POA: Diagnosis not present

## 2016-08-17 DIAGNOSIS — R1011 Right upper quadrant pain: Secondary | ICD-10-CM

## 2016-08-17 DIAGNOSIS — E7800 Pure hypercholesterolemia, unspecified: Secondary | ICD-10-CM | POA: Diagnosis not present

## 2016-08-17 DIAGNOSIS — K219 Gastro-esophageal reflux disease without esophagitis: Secondary | ICD-10-CM

## 2016-08-17 DIAGNOSIS — N400 Enlarged prostate without lower urinary tract symptoms: Secondary | ICD-10-CM | POA: Diagnosis not present

## 2016-08-17 DIAGNOSIS — M549 Dorsalgia, unspecified: Secondary | ICD-10-CM

## 2016-08-17 DIAGNOSIS — E559 Vitamin D deficiency, unspecified: Secondary | ICD-10-CM | POA: Diagnosis not present

## 2016-08-17 NOTE — Progress Notes (Signed)
Subjective:    Patient ID: Austin Graham, male    DOB: May 24, 1951, 65 y.o.   MRN: [id number]  HPI Pt here for follow up and management of chronic medical problems which includes diabetes, hyperlipidemia and hypertension. He is taking medications regularly.The patient is doing well today with minimal complaints other than discussing his elevated blood sugar. He will get his flu shot today and will return to the office fasting for his lab work. He is due to get a urine microalbumin and he needs an eye exam because of his diabetes. The patient denies any chest pain or shortness of breath. He does have a lot of discomfort in his back between his shoulder blades. This is not necessarily related to activity and its more of a soreness. He complains of some right upper quadrant pain and discomfort not related to physical activity but more related to eating. He denies any blood in the stool or black tarry bowel movements. He denies any trouble passing his water including burning pain frequency or blood in the urine. He is concerned that he cannot get his blood sugar under good control. He admits to not taking his medicine as regularly as he should. The only time his A1c was under good control was when he lost several pounds of weight prior to his right hip surgery    Patient Active Problem List   Diagnosis Date Noted  . Status post right hip replacement 08/07/2015  . OA (osteoarthritis) of hip 06/12/2015  . Prostate lump 12/25/2013  . Hypertension 11/30/2013  . Hyperlipidemia 11/30/2013  . Multinodular goiter 11/30/2013  . Nephrolithiasis 11/30/2013  . BPH (benign prostatic hyperplasia) 11/30/2013  . Degenerative disc disease, lumbar 11/30/2013  . Gastroesophageal reflux disease with hiatal hernia 11/30/2013  . Diabetes, poorly controlled    Outpatient Encounter Prescriptions as of 08/17/2016  Medication Sig  . aspirin 81 MG tablet Take by mouth.  Marland Kitchen atorvastatin (LIPITOR) 40 MG tablet Take 40  mg by mouth daily.  . Cholecalciferol (VITAMIN D) 2000 units CAPS Take 1 capsule by mouth daily.  Marland Kitchen glucose blood (ONE TOUCH ULTRA TEST) test strip Check BS QD and PRN. E11.9  . metFORMIN (GLUCOPHAGE) 500 MG tablet Take 500 mg by mouth 3 (three) times daily.  Marland Kitchen olmesartan (BENICAR) 20 MG tablet Take 10 mg by mouth daily.   . Omega-3 1000 MG CAPS Take by mouth. Patient takes two daily.  . Vitamins/Minerals TABS Take by mouth.  . [DISCONTINUED] metFORMIN (GLUCOPHAGE-XR) 500 MG 24 hr tablet TAKE 1 TABLET (500 MG TOTAL) BY MOUTH 2 (TWO) TIMES DAILY. (Patient taking differently: TAKE 1 TABLET (500 MG TOTAL) BY MOUTH 3 (Three) TIMES DAILY.)  . [DISCONTINUED] metFORMIN (GLUCOPHAGE-XR) 500 MG 24 hr tablet TAKE 1 TABLET BY MOUTH TWICE A DAY (Patient taking differently: TAKE 1 TABLET BY MOUTH TID)  . [DISCONTINUED] doxycycline (VIBRA-TABS) 100 MG tablet Take 1 tablet (100 mg total) by mouth 2 (two) times daily.   No facility-administered encounter medications on file as of 08/17/2016.       Review of Systems  Constitutional: Negative.   HENT: Negative.   Eyes: Negative.   Respiratory: Negative.   Cardiovascular: Negative.   Gastrointestinal: Negative.   Endocrine: Negative.        Elevated BS  Genitourinary: Negative.   Musculoskeletal: Negative.   Skin: Negative.   Allergic/Immunologic: Negative.   Neurological: Negative.   Hematological: Negative.   Psychiatric/Behavioral: Negative.        Objective:   Physical Exam  Constitutional: He is oriented to person, place, and time. He appears well-developed and well-nourished. No distress.  HENT:  Head: Normocephalic and atraumatic.  Right Ear: External ear normal.  Left Ear: External ear normal.  Nose: Nose normal.  Mouth/Throat: Oropharynx is clear and moist. No oropharyngeal exudate.  Eyes: Conjunctivae and EOM are normal. Pupils are equal, round, and reactive to light. Right eye exhibits no discharge. Left eye exhibits no discharge.  No scleral icterus.  Neck: Normal range of motion. Neck supple. No thyromegaly present.  Cardiovascular: Normal rate, regular rhythm, normal heart sounds and intact distal pulses.   No murmur heard. The heart was regular at 96/m. The patient said he had not had any caffeine today.  Pulmonary/Chest: Effort normal and breath sounds normal. No respiratory distress. He has no wheezes. He has no rales. He exhibits no tenderness.  Clear anteriorly and posteriorly  Abdominal: Soft. Bowel sounds are normal. He exhibits no mass. There is no tenderness. There is no rebound and no guarding.  No liver or spleen enlargement Minimal right upper quadrant and epigastric tenderness  Musculoskeletal: Normal range of motion. He exhibits no edema.  Lymphadenopathy:    He has no cervical adenopathy.  Neurological: He is alert and oriented to person, place, and time. He has normal reflexes. No cranial nerve deficit.  Skin: Skin is warm and dry. No rash noted.  Psychiatric: He has a normal mood and affect. His behavior is normal. Judgment and thought content normal.  Nursing note and vitals reviewed.  BP 123/70 (BP Location: Left Arm)   Pulse (!) 111   Temp 97.9 F (36.6 C) (Oral)   Ht 6' (1.829 m)   Wt 208 lb (94.3 kg)   BMI 28.21 kg/m         Assessment & Plan:  1. Type 2 diabetes mellitus without complication, without long-term current use of insulin ([place]) -Make more efforts at practicing aggressive therapeutic lifestyle changes -Record blood sugar readings more regularly - Bayer DCA Hb A1c Waived; Future - CBC with Differential/Platelet; Future  2. Pure hypercholesterolemia -Continue aggressive therapeutic lifestyle changes and atorvastatin - CBC with Differential/Platelet; Future - NMR, lipoprofile; Future  3. Benign prostatic hyperplasia, unspecified whether lower urinary tract symptoms present -Patient has no complaints with this today - CBC with Differential/Platelet; Future  4.  Essential hypertension -The blood pressure is good today and he will continue with his current blood pressure medication - BMP8+EGFR; Future - CBC with Differential/Platelet; Future - Hepatic function panel; Future  5. Vitamin D deficiency -Continue with current treatment pending results of lab work - CBC with Differential/Platelet; Future - VITAMIN D 25 Hydroxy (Vit-D Deficiency, Fractures); Future  6. Gastroesophageal reflux disease with hiatal hernia -Take ranitidine 150 mg more regularly until rechecked in 4-6 weeks - CBC with Differential/Platelet; Future - Hepatic function panel; Future  7. Upper back pain -Up on reviewing his recent chest x-ray a did describe some degenerative changes in the thoracic spine.  8. Right upper quadrant abdominal pain -Ultrasound of abdomen  Patient Instructions  Continue current medications. Continue good therapeutic lifestyle changes which include good diet and exercise. Fall precautions discussed with patient. If an FOBT was given today- please return it to our front desk. If you are over 2 years old - you may need Prevnar 51 or the adult Pneumonia vaccine.  **Flu shots are available--- please call and schedule a FLU-CLINIC appointment**  After your visit with [place] today you will receive a survey in the mail or  online from [employer] regarding your care with [place]. Please take a moment to fill this out. Your feedback is very important to [place] as you can help [place] better understand your patient needs as well as improve your experience and satisfaction. WE CARE ABOUT YOU!!!   Return to the office for fasting lab work as planned. We will see what the A1c is and go from there as far as how much more aggressive we can be at this point in time.  Reduce caffeine intake Work aggressively on weight loss It makes no sense to increase medicine when you know the you can get your blood sugar down with more weight loss as you have done in the past Drink plenty  of fluids and stay well hydrated Take your medication regularly Take the Zantac 150 mg over-the-counter twice daily until your next return visit Take Tylenol for back pain Try to maintain good posture   Arrie Senate MD

## 2016-08-17 NOTE — Patient Instructions (Addendum)
Continue current medications. Continue good therapeutic lifestyle changes which include good diet and exercise. Fall precautions discussed with patient. If an FOBT was given today- please return it to our front desk. If you are over 63 years old - you may need Prevnar 74 or the adult Pneumonia vaccine.  **Flu shots are available--- please call and schedule a FLU-CLINIC appointment**  After your visit with [place] today you will receive a survey in the mail or online from [employer] regarding your care with [place]. Please take a moment to fill this out. Your feedback is very important to [place] as you can help [place] better understand your patient needs as well as improve your experience and satisfaction. WE CARE ABOUT YOU!!!   Return to the office for fasting lab work as planned. We will see what the A1c is and go from there as far as how much more aggressive we can be at this point in time.  Reduce caffeine intake Work aggressively on weight loss It makes no sense to increase medicine when you know the you can get your blood sugar down with more weight loss as you have done in the past Drink plenty of fluids and stay well hydrated Take your medication regularly Take the Zantac 150 mg over-the-counter twice daily until your next return visit Take Tylenol for back pain Try to maintain good posture

## 2016-08-18 ENCOUNTER — Other Ambulatory Visit: Payer: BLUE CROSS/BLUE SHIELD

## 2016-08-18 DIAGNOSIS — E119 Type 2 diabetes mellitus without complications: Secondary | ICD-10-CM

## 2016-08-18 DIAGNOSIS — I10 Essential (primary) hypertension: Secondary | ICD-10-CM

## 2016-08-18 DIAGNOSIS — K219 Gastro-esophageal reflux disease without esophagitis: Secondary | ICD-10-CM

## 2016-08-18 DIAGNOSIS — K449 Diaphragmatic hernia without obstruction or gangrene: Secondary | ICD-10-CM

## 2016-08-18 DIAGNOSIS — E559 Vitamin D deficiency, unspecified: Secondary | ICD-10-CM

## 2016-08-18 DIAGNOSIS — E7800 Pure hypercholesterolemia, unspecified: Secondary | ICD-10-CM

## 2016-08-18 DIAGNOSIS — N400 Enlarged prostate without lower urinary tract symptoms: Secondary | ICD-10-CM

## 2016-08-18 LAB — BAYER DCA HB A1C WAIVED: HB A1C (BAYER DCA - WAIVED): 8.5 % — ABNORMAL HIGH (ref <7.0)

## 2016-08-18 NOTE — Addendum Note (Signed)
Addended by: Zannie Cove on: 08/18/2016 02:39 PM   Modules accepted: Orders

## 2016-08-19 ENCOUNTER — Ambulatory Visit (HOSPITAL_COMMUNITY): Admission: RE | Admit: 2016-08-19 | Payer: BLUE CROSS/BLUE SHIELD | Source: Ambulatory Visit

## 2016-08-19 ENCOUNTER — Other Ambulatory Visit: Payer: Self-pay

## 2016-08-19 DIAGNOSIS — R1011 Right upper quadrant pain: Secondary | ICD-10-CM

## 2016-08-19 LAB — CBC WITH DIFFERENTIAL/PLATELET
BASOS: 1 %
Basophils Absolute: 0.1 10*3/uL (ref 0.0–0.2)
EOS (ABSOLUTE): 0.2 10*3/uL (ref 0.0–0.4)
Eos: 4 %
Hematocrit: 40.5 % (ref 37.5–51.0)
Hemoglobin: 14 g/dL (ref 12.6–17.7)
IMMATURE GRANS (ABS): 0 10*3/uL (ref 0.0–0.1)
IMMATURE GRANULOCYTES: 0 %
LYMPHS: 33 %
Lymphocytes Absolute: 2.2 10*3/uL (ref 0.7–3.1)
MCH: 31 pg (ref 26.6–33.0)
MCHC: 34.6 g/dL (ref 31.5–35.7)
MCV: 90 fL (ref 79–97)
Monocytes Absolute: 0.5 10*3/uL (ref 0.1–0.9)
Monocytes: 8 %
NEUTROS PCT: 54 %
Neutrophils Absolute: 3.7 10*3/uL (ref 1.4–7.0)
PLATELETS: 229 10*3/uL (ref 150–379)
RBC: 4.52 x10E6/uL (ref 4.14–5.80)
RDW: 13.7 % (ref 12.3–15.4)
WBC: 6.7 10*3/uL (ref 3.4–10.8)

## 2016-08-19 LAB — HEPATIC FUNCTION PANEL
ALBUMIN: 5 g/dL — AB (ref 3.6–4.8)
ALT: 36 IU/L (ref 0–44)
AST: 22 IU/L (ref 0–40)
Alkaline Phosphatase: 87 IU/L (ref 39–117)
BILIRUBIN TOTAL: 1 mg/dL (ref 0.0–1.2)
BILIRUBIN, DIRECT: 0.22 mg/dL (ref 0.00–0.40)
TOTAL PROTEIN: 7 g/dL (ref 6.0–8.5)

## 2016-08-19 LAB — NMR, LIPOPROFILE
CHOLESTEROL: 168 mg/dL (ref 100–199)
HDL Cholesterol by NMR: 38 mg/dL — ABNORMAL LOW (ref >39)
HDL Particle Number: 27.9 umol/L — ABNORMAL LOW (ref >=30.5)
LDL Particle Number: 1302 nmol/L — ABNORMAL HIGH (ref <1000)
LDL Size: 20.4 nm (ref >20.5)
LDL-C: 98 mg/dL (ref 0–99)
LP-IR SCORE: 69 — AB (ref <=45)
Small LDL Particle Number: 795 nmol/L — ABNORMAL HIGH (ref <=527)
TRIGLYCERIDES BY NMR: 161 mg/dL — AB (ref 0–149)

## 2016-08-19 LAB — BMP8+EGFR
BUN/Creatinine Ratio: 16 (ref 10–24)
BUN: 18 mg/dL (ref 8–27)
CALCIUM: 9.7 mg/dL (ref 8.6–10.2)
CO2: 22 mmol/L (ref 18–29)
CREATININE: 1.16 mg/dL (ref 0.76–1.27)
Chloride: 101 mmol/L (ref 96–106)
GFR calc Af Amer: 77 mL/min/{1.73_m2} (ref >59)
GFR, EST NON AFRICAN AMERICAN: 67 mL/min/{1.73_m2} (ref >59)
Glucose: 203 mg/dL — ABNORMAL HIGH (ref 65–99)
POTASSIUM: 4.2 mmol/L (ref 3.5–5.2)
Sodium: 141 mmol/L (ref 134–144)

## 2016-08-19 LAB — VITAMIN D 25 HYDROXY (VIT D DEFICIENCY, FRACTURES): VIT D 25 HYDROXY: 52.1 ng/mL (ref 30.0–100.0)

## 2016-08-24 ENCOUNTER — Telehealth: Payer: Self-pay | Admitting: Family Medicine

## 2016-08-25 NOTE — Telephone Encounter (Signed)
lmtcb jkp 11/28

## 2016-08-31 ENCOUNTER — Ambulatory Visit
Admission: RE | Admit: 2016-08-31 | Discharge: 2016-08-31 | Disposition: A | Discharge status: Home / Self Care | Payer: BLUE CROSS/BLUE SHIELD | Source: Ambulatory Visit | Attending: Family Medicine | Admitting: Family Medicine

## 2016-08-31 DIAGNOSIS — R1011 Right upper quadrant pain: Secondary | ICD-10-CM

## 2016-09-02 ENCOUNTER — Other Ambulatory Visit: Payer: Self-pay | Admitting: Family Medicine

## 2016-09-02 NOTE — Telephone Encounter (Signed)
Spoke with pt

## 2016-09-04 ENCOUNTER — Telehealth: Payer: Self-pay | Admitting: Family Medicine

## 2016-09-04 ENCOUNTER — Other Ambulatory Visit: Payer: Self-pay | Admitting: *Deleted

## 2016-09-04 MED ORDER — METFORMIN HCL ER 500 MG PO TB24: ORAL_TABLET | ORAL | 3 refills | Status: DC

## 2016-09-04 MED ORDER — METFORMIN HCL 500 MG PO TABS: 500.0000 mg | ORAL_TABLET | Freq: Three times a day (TID) | ORAL | 3 refills | Status: DC

## 2016-09-04 NOTE — Telephone Encounter (Signed)
New rx sent in to [facility]

## 2016-09-04 NOTE — Telephone Encounter (Signed)
This was resent correctly and pt aware

## 2016-09-22 IMAGING — CR DG PORTABLE PELVIS
1 series · 1 of 1 positions shown · non-contrast
Comparison: None.

CLINICAL DATA: Postop right hip

EXAM:
PORTABLE PELVIS 1-2 VIEWS

[AP]
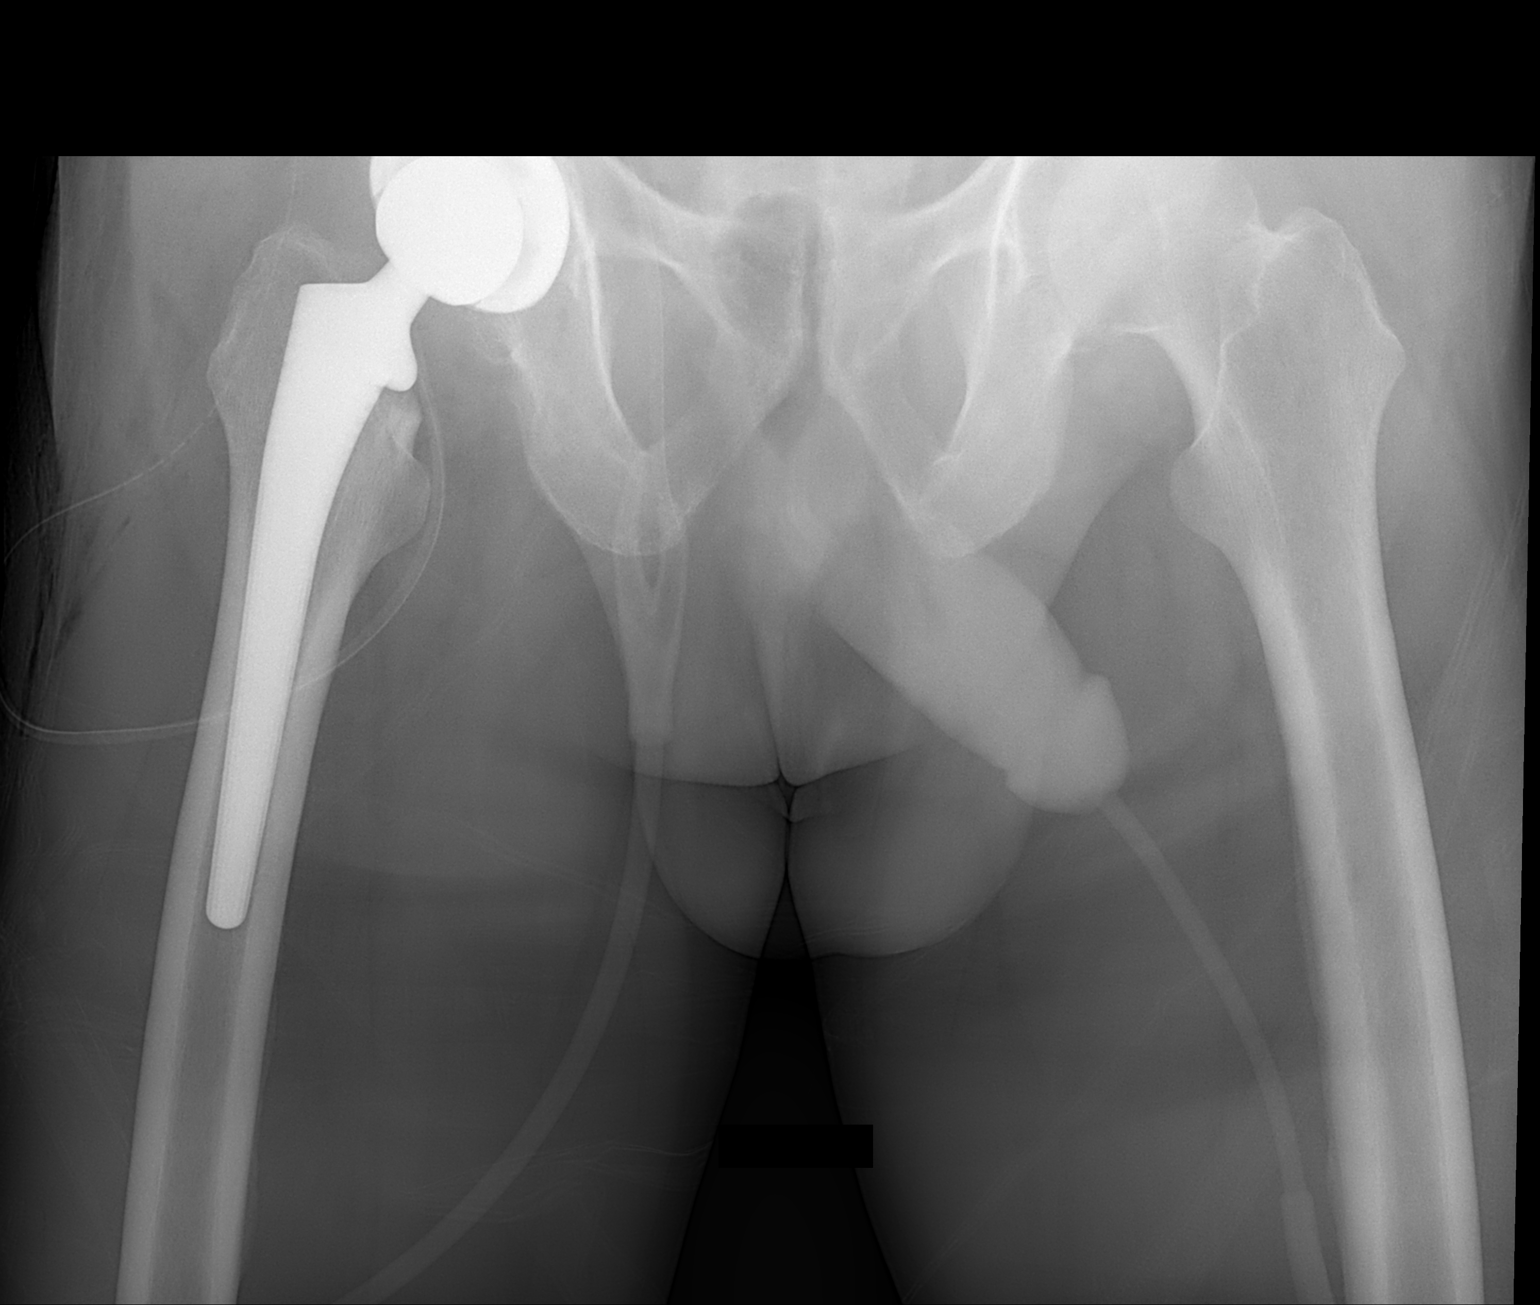

[1 of 1 positions shown; findings below may reference images not displayed]

FINDINGS: Total right hip arthroplasty without periprosthetic fracture or
dislocation.
IMPRESSION: No acute finding after total right hip arthroplasty.

## 2016-09-24 ENCOUNTER — Ambulatory Visit: Payer: BLUE CROSS/BLUE SHIELD | Admitting: Family Medicine

## 2016-10-16 ENCOUNTER — Other Ambulatory Visit: Payer: Self-pay | Admitting: Nurse Practitioner

## 2016-10-16 ENCOUNTER — Telehealth: Payer: Self-pay | Admitting: Nurse Practitioner

## 2016-10-16 MED ORDER — OSELTAMIVIR PHOSPHATE 75 MG PO CAPS: 75.0000 mg | ORAL_CAPSULE | Freq: Every day | ORAL | 0 refills | Status: DC

## 2016-10-16 NOTE — Telephone Encounter (Signed)
Please review and advise.

## 2016-10-20 ENCOUNTER — Other Ambulatory Visit: Payer: Self-pay | Admitting: Family Medicine

## 2016-11-24 ENCOUNTER — Other Ambulatory Visit: Payer: Self-pay | Admitting: Family Medicine

## 2016-12-31 ENCOUNTER — Ambulatory Visit (INDEPENDENT_AMBULATORY_CARE_PROVIDER_SITE_OTHER): Payer: BLUE CROSS/BLUE SHIELD | Admitting: Family Medicine

## 2016-12-31 ENCOUNTER — Encounter: Payer: Self-pay | Admitting: Family Medicine

## 2016-12-31 VITALS — BP 130/80 | HR 119 | Temp 97.0°F | Ht 72.0 in | Wt 209.0 lb

## 2016-12-31 DIAGNOSIS — Z0000 Encounter for general adult medical examination without abnormal findings: Secondary | ICD-10-CM

## 2016-12-31 DIAGNOSIS — E7800 Pure hypercholesterolemia, unspecified: Secondary | ICD-10-CM

## 2016-12-31 DIAGNOSIS — K449 Diaphragmatic hernia without obstruction or gangrene: Secondary | ICD-10-CM

## 2016-12-31 DIAGNOSIS — K219 Gastro-esophageal reflux disease without esophagitis: Secondary | ICD-10-CM

## 2016-12-31 DIAGNOSIS — E119 Type 2 diabetes mellitus without complications: Secondary | ICD-10-CM

## 2016-12-31 DIAGNOSIS — E559 Vitamin D deficiency, unspecified: Secondary | ICD-10-CM

## 2016-12-31 DIAGNOSIS — I10 Essential (primary) hypertension: Secondary | ICD-10-CM

## 2016-12-31 DIAGNOSIS — M7711 Lateral epicondylitis, right elbow: Secondary | ICD-10-CM

## 2016-12-31 DIAGNOSIS — N400 Enlarged prostate without lower urinary tract symptoms: Secondary | ICD-10-CM

## 2016-12-31 NOTE — Progress Notes (Signed)
Subjective:    Patient ID: Austin Graham, male    DOB: 1951/01/05, 66 y.o.   MRN: [id number]  HPI Patient is here today for annual wellness exam and follow up of chronic medical problems which includes hyperlipidemia and diabetes. He is taking medication regularly.The patient is doing well overall and does complain of some right elbow pain. He sees the urologist at [facility] and they check his prostate but we will do his PSA here and make sure they get a copy of that. He will return to the office for his fasting lab work. His weight is 209 and this is about the same as it was in November 2017. The patient denies any chest pain or shortness of breath. He denies any trouble with his intestinal tract including nausea vomiting diarrhea blood in the stool or black tarry bowel movements. According to the computer his last colonoscopy was in 2011. His mother had colon cancer. So this would put his date for the next colonoscopy as past due to. We will schedule him for a visit with the gastroenterologist for follow-up and probable repeat colonoscopy. The patient is seeing the urologist regularly at [facility] and has he thinks an upcoming appointment soon with him and we will do the PSA test here and he will take a copy of that with him to see the urologist there. He also has a tender right lateral epicondyles and we will inject Marcaine and Kenalog locally into this area to help with the right lateral epicondylitis. The patient says his blood sugars have not been under good control. He is not very adherent with diet and exercise.   Patient Active Problem List   Diagnosis Date Noted  . Status post right hip replacement 08/07/2015  . OA (osteoarthritis) of hip 06/12/2015  . Prostate lump 12/25/2013  . Hypertension 11/30/2013  . Hyperlipidemia 11/30/2013  . Multinodular goiter 11/30/2013  . Nephrolithiasis 11/30/2013  . BPH (benign  prostatic hyperplasia) 11/30/2013  . Degenerative disc disease, lumbar 11/30/2013  . Gastroesophageal reflux disease with hiatal hernia 11/30/2013  . Diabetes, poorly controlled    Outpatient Encounter Prescriptions as of 12/31/2016  Medication Sig  . aspirin 81 MG tablet Take by mouth.  Marland Kitchen atorvastatin (LIPITOR) 40 MG tablet Take 40 mg by mouth daily.  . Cholecalciferol (VITAMIN D) 2000 units CAPS Take 1 capsule by mouth daily.  Marland Kitchen glucose blood (ONE TOUCH ULTRA TEST) test strip Check BS QD and PRN. E11.9  . metFORMIN (GLUCOPHAGE-XR) 500 MG 24 hr tablet TAKE 1 TABLET (500 MG TOTAL) BY MOUTH 3 (three) TIMES DAILY.  Marland Kitchen olmesartan (BENICAR) 20 MG tablet Take 10 mg by mouth daily.   . Omega-3 1000 MG CAPS Take by mouth. Patient takes two daily.  . Vitamins/Minerals TABS Take by mouth.  . [DISCONTINUED] atorvastatin (LIPITOR) 80 MG tablet TAKE 1 TABLET (80 MG TOTAL) BY MOUTH DAILY. AS DIRECTED  . [DISCONTINUED] olmesartan (BENICAR) 40 MG tablet TAKE 1 TABLET BY MOUTH EVERY DAY  . [DISCONTINUED] oseltamivir (TAMIFLU) 75 MG capsule Take 1 capsule (75 mg total) by mouth daily.   No facility-administered encounter medications on file as of 12/31/2016.       Review of Systems  Constitutional: Negative.   HENT: Negative.   Eyes: Negative.   Respiratory: Negative.   Cardiovascular: Negative.   Gastrointestinal: Negative.   Endocrine: Negative.   Genitourinary: Negative.   Musculoskeletal: Positive for arthralgias (right elbow pain).  Skin: Negative.  Allergic/Immunologic: Negative.   Neurological: Negative.   Hematological: Negative.   Psychiatric/Behavioral: Negative.        Objective:   Physical Exam  Constitutional: He is oriented to person, place, and time. He appears well-developed and well-nourished. No distress.  The patient is pleasant and alert and admits to not following his diet closely.  HENT:  Head: Normocephalic and atraumatic.  Right Ear: External ear normal.  Left Ear:  External ear normal.  Nose: Nose normal.  Mouth/Throat: Oropharynx is clear and moist. No oropharyngeal exudate.  Eyes: Conjunctivae and EOM are normal. Pupils are equal, round, and reactive to light. Right eye exhibits no discharge. Left eye exhibits no discharge. No scleral icterus.  Neck: Normal range of motion. Neck supple. No thyromegaly present.  NO bruits thyromegaly or anterior cervical adenopathy  Cardiovascular: Normal rate, regular rhythm, normal heart sounds and intact distal pulses.   No murmur heard. The heart is regular at 84/m  Pulmonary/Chest: Effort normal and breath sounds normal. No respiratory distress. He has no wheezes. He has no rales. He exhibits no tenderness.  Clear anteriorly and posteriorly  Abdominal: Soft. Bowel sounds are normal. He exhibits no mass. There is no tenderness. There is no rebound and no guarding.  No liver or spleen enlargement no masses and no bruits  Genitourinary:  Genitourinary Comments: This exam is deferred to his urologist at [facility] he is supposed to see him sometime in the next month. He will take a copy of the PSA report from our office to the urologist.  Musculoskeletal: Normal range of motion. He exhibits no edema.  Lymphadenopathy:    He has no cervical adenopathy.  Neurological: He is alert and oriented to person, place, and time. He has normal reflexes. No cranial nerve deficit.  Skin: Skin is warm and dry. No rash noted.  Psychiatric: He has a normal mood and affect. His behavior is normal. Judgment and thought content normal.  Nursing note and vitals reviewed.  BP 130/80 (BP Location: Left Arm)   Pulse (!) 119   Temp 97 F (36.1 C) (Oral)   Ht 6' (1.829 m)   Wt 209 lb (94.8 kg)   BMI 28.35 kg/m         Assessment & Plan:  1. Type 2 diabetes mellitus without complication, without long-term current use of insulin ([place]) -Patient is due to get an eye exam and he will schedule  this. -The patient must try to do better with his diet and exercise regimen he has shown he can do this in the past but just doesn't seem to be willing to recommit himself to doing this again. - Microalbumin / creatinine urine ratio - BMP8+EGFR; Future - CBC with Differential/Platelet; Future - Bayer DCA Hb A1c Waived; Future  2. Pure hypercholesterolemia -Continue current treatment pending results of lab work - CBC with Differential/Platelet; Future - Lipid panel; Future  3. Essential hypertension -The blood pressure is good today and he will continue with current treatment - BMP8+EGFR; Future - CBC with Differential/Platelet; Future - Hepatic function panel; Future  4. Benign prostatic hyperplasia, unspecified whether lower urinary tract symptoms present -Follow-up with urology at [facility] for rectal exam - CBC with Differential/Platelet; Future - PSA, total and free; Future  5. Vitamin D deficiency -Continue vitamin D replacement pending results of lab work - CBC with Differential/Platelet; Future - VITAMIN D 25 Hydroxy (Vit-D Deficiency, Fractures); Future  6. Gastroesophageal reflux disease with hiatal hernia -The  patient is having no complaints with this today and he will continue with watching his diet closely and avoiding irritating foods. - CBC with Differential/Platelet; Future  7. Physical exam, annual -We'll schedule patient for colonoscopy as he is due 5 years after his previous one because of his family history of his mother having colon cancer. - BMP8+EGFR; Future - CBC with Differential/Platelet; Future - Hepatic function panel; Future - VITAMIN D 25 Hydroxy (Vit-D Deficiency, Fractures); Future - Lipid panel; Future - PSA, total and free; Future - Bayer DCA Hb A1c Waived; Future  8. Right lateral epicondylitis -1/2 mL of Marcaine and 1/2 mL of Kenalog injected into right lateral epicondyles area. This was done after cleansing the area well with  Betadine and applying a pressure dressing after the injection. The patient tolerated the procedure well.  Patient Instructions  Continue current medications. Continue good therapeutic lifestyle changes which include good diet and exercise. Fall precautions discussed with patient. If an FOBT was given today- please return it to our front desk. If you are over 78 years old - you may need Prevnar 83 or the adult Pneumonia vaccine.  **Flu shots are available--- please call and schedule a FLU-CLINIC appointment**  After your visit with [place] today you will receive a survey in the mail or online from [employer] regarding your care with [place]. Please take a moment to fill this out. Your feedback is very important to [place] as you can help [place] better understand your patient needs as well as improve your experience and satisfaction. WE CARE ABOUT YOU!!!   The patient must try to do better with diet and exercise. There is really no excuse that he should not do this. He should check his blood sugars regularly He should follow-up with the urologist as planned He should limit the use of his right elbow especially in a repetitive way over the next 10-14 days. He should use some warm wet compresses after a couple of days from the injection to the elbow 20 minutes a couple times daily.    Arrie Senate MD

## 2016-12-31 NOTE — Patient Instructions (Addendum)
Continue current medications. Continue good therapeutic lifestyle changes which include good diet and exercise. Fall precautions discussed with patient. If an FOBT was given today- please return it to our front desk. If you are over 64 years old - you may need Prevnar 32 or the adult Pneumonia vaccine.  **Flu shots are available--- please call and schedule a FLU-CLINIC appointment**  After your visit with [place] today you will receive a survey in the mail or online from [employer] regarding your care with [place]. Please take a moment to fill this out. Your feedback is very important to [place] as you can help [place] better understand your patient needs as well as improve your experience and satisfaction. WE CARE ABOUT YOU!!!   The patient must try to do better with diet and exercise. There is really no excuse that he should not do this. He should check his blood sugars regularly He should follow-up with the urologist as planned He should limit the use of his right elbow especially in a repetitive way over the next 10-14 days. He should use some warm wet compresses after a couple of days from the injection to the elbow 20 minutes a couple times daily.

## 2017-01-07 ENCOUNTER — Ambulatory Visit: Payer: BLUE CROSS/BLUE SHIELD

## 2017-01-15 ENCOUNTER — Other Ambulatory Visit: Payer: BLUE CROSS/BLUE SHIELD

## 2017-01-15 DIAGNOSIS — E119 Type 2 diabetes mellitus without complications: Secondary | ICD-10-CM

## 2017-01-15 DIAGNOSIS — Z0000 Encounter for general adult medical examination without abnormal findings: Secondary | ICD-10-CM

## 2017-01-15 DIAGNOSIS — K219 Gastro-esophageal reflux disease without esophagitis: Secondary | ICD-10-CM

## 2017-01-15 DIAGNOSIS — N400 Enlarged prostate without lower urinary tract symptoms: Secondary | ICD-10-CM

## 2017-01-15 DIAGNOSIS — K449 Diaphragmatic hernia without obstruction or gangrene: Secondary | ICD-10-CM

## 2017-01-15 DIAGNOSIS — I10 Essential (primary) hypertension: Secondary | ICD-10-CM

## 2017-01-15 DIAGNOSIS — E7800 Pure hypercholesterolemia, unspecified: Secondary | ICD-10-CM

## 2017-01-15 DIAGNOSIS — E559 Vitamin D deficiency, unspecified: Secondary | ICD-10-CM

## 2017-01-15 LAB — BAYER DCA HB A1C WAIVED: HB A1C: 9.3 % — AB (ref <7.0)

## 2017-01-16 LAB — BMP8+EGFR
BUN/Creatinine Ratio: 13 (ref 10–24)
BUN: 14 mg/dL (ref 8–27)
CALCIUM: 9.8 mg/dL (ref 8.6–10.2)
CHLORIDE: 99 mmol/L (ref 96–106)
CO2: 25 mmol/L (ref 18–29)
Creatinine, Ser: 1.05 mg/dL (ref 0.76–1.27)
GFR calc non Af Amer: 75 mL/min/{1.73_m2} (ref >59)
GFR, EST AFRICAN AMERICAN: 86 mL/min/{1.73_m2} (ref >59)
GLUCOSE: 173 mg/dL — AB (ref 65–99)
Potassium: 4.8 mmol/L (ref 3.5–5.2)
Sodium: 140 mmol/L (ref 134–144)

## 2017-01-16 LAB — CBC WITH DIFFERENTIAL/PLATELET
BASOS ABS: 0.1 10*3/uL (ref 0.0–0.2)
Basos: 1 %
EOS (ABSOLUTE): 0.1 10*3/uL (ref 0.0–0.4)
Eos: 2 %
HEMOGLOBIN: 13.7 g/dL (ref 13.0–17.7)
Hematocrit: 40.4 % (ref 37.5–51.0)
IMMATURE GRANS (ABS): 0 10*3/uL (ref 0.0–0.1)
Immature Granulocytes: 0 %
LYMPHS: 40 %
Lymphocytes Absolute: 2.5 10*3/uL (ref 0.7–3.1)
MCH: 30.1 pg (ref 26.6–33.0)
MCHC: 33.9 g/dL (ref 31.5–35.7)
MCV: 89 fL (ref 79–97)
MONOCYTES: 6 %
Monocytes Absolute: 0.4 10*3/uL (ref 0.1–0.9)
NEUTROS PCT: 51 %
Neutrophils Absolute: 3.3 10*3/uL (ref 1.4–7.0)
PLATELETS: 236 10*3/uL (ref 150–379)
RBC: 4.55 x10E6/uL (ref 4.14–5.80)
RDW: 13.8 % (ref 12.3–15.4)
WBC: 6.3 10*3/uL (ref 3.4–10.8)

## 2017-01-16 LAB — HEPATIC FUNCTION PANEL
ALBUMIN: 4.9 g/dL — AB (ref 3.6–4.8)
ALK PHOS: 93 IU/L (ref 39–117)
ALT: 33 IU/L (ref 0–44)
AST: 18 IU/L (ref 0–40)
BILIRUBIN, DIRECT: 0.22 mg/dL (ref 0.00–0.40)
Bilirubin Total: 0.9 mg/dL (ref 0.0–1.2)
TOTAL PROTEIN: 6.5 g/dL (ref 6.0–8.5)

## 2017-01-16 LAB — PSA, TOTAL AND FREE
PROSTATE SPECIFIC AG, SERUM: 0.6 ng/mL (ref 0.0–4.0)
PSA FREE: 0.14 ng/mL
PSA, Free Pct: 23.3 %

## 2017-01-16 LAB — LIPID PANEL
CHOLESTEROL TOTAL: 122 mg/dL (ref 100–199)
Chol/HDL Ratio: 3.7 ratio (ref 0.0–5.0)
HDL: 33 mg/dL — AB (ref >39)
LDL CALC: 64 mg/dL (ref 0–99)
Triglycerides: 124 mg/dL (ref 0–149)
VLDL CHOLESTEROL CAL: 25 mg/dL (ref 5–40)

## 2017-01-16 LAB — VITAMIN D 25 HYDROXY (VIT D DEFICIENCY, FRACTURES): Vit D, 25-Hydroxy: 58.8 ng/mL (ref 30.0–100.0)

## 2017-01-29 ENCOUNTER — Encounter: Payer: Self-pay | Admitting: Family

## 2017-01-29 ENCOUNTER — Ambulatory Visit (INDEPENDENT_AMBULATORY_CARE_PROVIDER_SITE_OTHER): Payer: BLUE CROSS/BLUE SHIELD | Admitting: Family

## 2017-01-29 VITALS — BP 130/84 | HR 90 | Temp 97.0°F | Ht 72.0 in | Wt 207.6 lb

## 2017-01-29 DIAGNOSIS — J209 Acute bronchitis, unspecified: Secondary | ICD-10-CM

## 2017-01-29 MED ORDER — DOXYCYCLINE HYCLATE 100 MG PO TABS: 100.0000 mg | ORAL_TABLET | Freq: Two times a day (BID) | ORAL | 0 refills | Status: DC

## 2017-01-29 MED ORDER — ALBUTEROL SULFATE HFA 108 (90 BASE) MCG/ACT IN AERS: 2.0000 | INHALATION_SPRAY | Freq: Four times a day (QID) | RESPIRATORY_TRACT | 2 refills | Status: DC | PRN

## 2017-01-29 NOTE — Patient Instructions (Signed)

## 2017-01-29 NOTE — Progress Notes (Signed)
Subjective:    Patient ID: Austin Graham, male    DOB: 13-May-1951, 66 y.o.   MRN: [id number]  Cough  The current episode started in the past 7 days. The problem has been waxing and waning. The problem occurs every few minutes. The cough is productive of sputum. Associated symptoms include headaches, myalgias, nasal congestion, rhinorrhea, a sore throat, shortness of breath and wheezing. Pertinent negatives include no chills, ear congestion, ear pain or fever. The symptoms are aggravated by lying down and pollens. He has tried rest (claritin and zyrtec) for the symptoms. The treatment provided mild relief.  Headache   Associated symptoms include coughing, rhinorrhea and a sore throat. Pertinent negatives include no ear pain or fever.      Review of Systems  Constitutional: Negative for chills and fever.  HENT: Positive for rhinorrhea and sore throat. Negative for ear pain.   Respiratory: Positive for cough, shortness of breath and wheezing.   Musculoskeletal: Positive for myalgias.  Neurological: Positive for headaches.  All other systems reviewed and are negative.      Objective:   Physical Exam  Constitutional: He is oriented to person, place, and time. He appears well-developed and well-nourished. No distress.  HENT:  Head: Normocephalic.  Right Ear: External ear normal. Tympanic membrane is not bulging.  Left Ear: External ear normal. Tympanic membrane is erythematous and bulging.  Nose: Mucosal edema and rhinorrhea present.  Mouth/Throat: Posterior oropharyngeal erythema present.  Eyes: Pupils are equal, round, and reactive to light. Right eye exhibits no discharge. Left eye exhibits no discharge.  Neck: Normal range of motion. Neck supple. No thyromegaly present.  Cardiovascular: Normal rate, regular rhythm, normal heart sounds and intact distal pulses.   No murmur heard. Pulmonary/Chest: Effort normal. No respiratory distress. He has no wheezes. He has rhonchi (coarse  nonproductive cough).  Abdominal: Soft. Bowel sounds are normal. He exhibits no distension. There is no tenderness.  Musculoskeletal: Normal range of motion. He exhibits no edema or tenderness.  Neurological: He is alert and oriented to person, place, and time.  Skin: Skin is warm and dry. No rash noted. No erythema.  Psychiatric: He has a normal mood and affect. His behavior is normal. Judgment and thought content normal.  Vitals reviewed.     BP 130/84   Pulse 90   Temp 97 F (36.1 C) (Oral)   Ht 6' (1.829 m)   Wt 207 lb 9.6 oz (94.2 kg)   BMI 28.16 kg/m      Assessment & Plan:  1. Acute bronchitis, unspecified organism - Take meds as prescribed - Use a cool mist humidifier  -Use saline nose sprays frequently -Saline irrigations of the nose can be very helpful if done frequently.  * 4X daily for 1 week*  * Use of a nettie pot can be helpful with this. Follow directions with this* -Force fluids -For any cough or congestion  Use plain Mucinex- regular strength or max strength is fine   * Children- consult with Pharmacist for dosing -For fever or aces or pains- take tylenol or ibuprofen appropriate for age and weight.  * for fevers greater than 101 orally you may alternate ibuprofen and tylenol every  3 hours. -Throat lozenges if help - doxycycline (VIBRA-TABS) 100 MG tablet; Take 1 tablet (100 mg total) by mouth 2 (two) times daily.  Dispense: 20 tablet; Refill: 0 - albuterol (PROVENTIL HFA;VENTOLIN HFA) 108 (90 Base) MCG/ACT inhaler; Inhale 2 puffs into the lungs every 6 (six) hours as  needed for wheezing or shortness of breath.  Dispense: 1 Inhaler; Refill: [place], FNP

## 2017-02-09 ENCOUNTER — Encounter: Payer: Self-pay | Admitting: Pharmacist

## 2017-02-09 ENCOUNTER — Ambulatory Visit (INDEPENDENT_AMBULATORY_CARE_PROVIDER_SITE_OTHER): Payer: BLUE CROSS/BLUE SHIELD | Admitting: Pharmacist

## 2017-02-09 VITALS — BP 122/70 | HR 75 | Ht 72.0 in | Wt 208.0 lb

## 2017-02-09 DIAGNOSIS — E119 Type 2 diabetes mellitus without complications: Secondary | ICD-10-CM

## 2017-02-09 MED ORDER — SITAGLIP PHOS-METFORMIN HCL ER 100-1000 MG PO TB24: 1.0000 | ORAL_TABLET | Freq: Every day | ORAL | 1 refills | Status: DC

## 2017-02-09 NOTE — Progress Notes (Signed)
Patient ID: Austin Graham, male   DOB: [birth year], 66 y.o.   MRN: [id number]  Subjective:    Austin Graham is a 66 y.o. male who presents for evaluation of Type 2 diabetes mellitus.  I have seen Austin Graham in the past but has been about 2 years since our last visit.  He recently has A1c checked and was 9.3% (01/15/2017). A1c has been increasing over the last 10 months.  He is currently taking metformin 500mg  tid (recently increased from bid).  Current symptoms/problems include hyperglycemia, polydipsia and fatigue and nocturia and have been unchanged. Symptoms have been present for 4 months.  Current monitoring regimen: home blood tests - qd times daily Home blood sugar records: 180 to low 200's Any episodes of hypoglycemia? no  Known diabetic complications: none Cardiovascular risk factors: advanced age (older than 58 for men, 38 for women), diabetes mellitus, family history of premature cardiovascular disease and male gender Eye exam current (within one year): unknown Weight trend: stable Current diet: in general, an "unhealthy" diet Current exercise: walking Medication Compliance?  Yes   Is He on ACE inhibitor or angiotensin II receptor blocker?  Yes    olmesartan 10g qd  The following portions of the patient's history were reviewed and updated as appropriate: allergies, current medications and problem list.    Objective:    BP 122/70   Pulse 75   Ht 6' (1.829 m)   Wt 208 lb (94.3 kg)   BMI 28.21 kg/m   Lab Review Glucose (mg/dL)  Date Value  01/15/2017 173 (H)  08/18/2016 203 (H)  04/01/2016 176 (H)   Glucose, Bld (mg/dL)  Date Value  06/13/2015 163 (H)  06/05/2015 109 (H)  11/22/2013 172 (H)   CO2 (mmol/L)  Date Value  01/15/2017 25  08/18/2016 22  04/01/2016 22   BUN (mg/dL)  Date Value  01/15/2017 14  08/18/2016 18  04/01/2016 19   Creat (mg/dL)  Date Value  11/22/2013 0.92  02/27/2013 1.06   Creatinine, Ser (mg/dL)  Date Value   01/15/2017 1.05  08/18/2016 1.16  04/01/2016 0.99      Assessment:    Diabetes Mellitus type II, under inadequate control.    Plan:    1.  Rx changes: d/c metformin; start Janumet XR 100/1000mg  take 1 tablet qam  2.  Education: Reviewed 'ABCs' of diabetes management (respective goals in parentheses):  A1C (<7), blood pressure (<130/80), and cholesterol (LDL <100). 3. Discussed pathophysiology of DM; difference between type 1 and type 2 DM. 4. CHO counting diet discussed.  Reviewed CHO amount in various foods and how to read nutrition labels.  Discussed recommended serving sizes.  5.  Recommend check BG 1-2  times a day 6.  Recommended increase physical activity - goal is 150 minutes per week 7. Follow up: 4 weeks

## 2017-03-11 ENCOUNTER — Telehealth: Payer: Self-pay | Admitting: Family Medicine

## 2017-03-11 NOTE — Telephone Encounter (Signed)
Gave pt Dennard Nip, MD's information

## 2017-03-16 ENCOUNTER — Other Ambulatory Visit: Payer: Self-pay | Admitting: Family Medicine

## 2017-05-06 ENCOUNTER — Encounter: Payer: Self-pay | Admitting: Family Medicine

## 2017-05-06 ENCOUNTER — Ambulatory Visit (INDEPENDENT_AMBULATORY_CARE_PROVIDER_SITE_OTHER): Payer: BLUE CROSS/BLUE SHIELD | Admitting: Family Medicine

## 2017-05-06 VITALS — BP 138/74 | HR 115 | Temp 97.2°F | Ht 72.0 in | Wt 207.0 lb

## 2017-05-06 DIAGNOSIS — M25521 Pain in right elbow: Secondary | ICD-10-CM | POA: Diagnosis not present

## 2017-05-06 DIAGNOSIS — E559 Vitamin D deficiency, unspecified: Secondary | ICD-10-CM | POA: Diagnosis not present

## 2017-05-06 DIAGNOSIS — R351 Nocturia: Secondary | ICD-10-CM

## 2017-05-06 DIAGNOSIS — N400 Enlarged prostate without lower urinary tract symptoms: Secondary | ICD-10-CM

## 2017-05-06 DIAGNOSIS — E7800 Pure hypercholesterolemia, unspecified: Secondary | ICD-10-CM | POA: Diagnosis not present

## 2017-05-06 DIAGNOSIS — E119 Type 2 diabetes mellitus without complications: Secondary | ICD-10-CM | POA: Diagnosis not present

## 2017-05-06 DIAGNOSIS — M25562 Pain in left knee: Secondary | ICD-10-CM | POA: Diagnosis not present

## 2017-05-06 DIAGNOSIS — I10 Essential (primary) hypertension: Secondary | ICD-10-CM

## 2017-05-06 DIAGNOSIS — K219 Gastro-esophageal reflux disease without esophagitis: Secondary | ICD-10-CM | POA: Diagnosis not present

## 2017-05-06 DIAGNOSIS — K449 Diaphragmatic hernia without obstruction or gangrene: Secondary | ICD-10-CM

## 2017-05-06 LAB — URINALYSIS, COMPLETE
BILIRUBIN UA: NEGATIVE
Leukocytes, UA: NEGATIVE
NITRITE UA: NEGATIVE
RBC UA: NEGATIVE
SPEC GRAV UA: 1.025 (ref 1.005–1.030)
UUROB: 0.2 mg/dL (ref 0.2–1.0)
pH, UA: 5 (ref 5.0–7.5)

## 2017-05-06 LAB — MICROSCOPIC EXAMINATION
Bacteria, UA: NONE SEEN
EPITHELIAL CELLS (NON RENAL): NONE SEEN /HPF (ref 0–10)
RBC, UA: NONE SEEN /hpf (ref 0–?)
RENAL EPITHEL UA: NONE SEEN /HPF
WBC UA: NONE SEEN /HPF (ref 0–?)

## 2017-05-06 NOTE — Patient Instructions (Addendum)
Continue current medications. Continue good therapeutic lifestyle changes which include good diet and exercise. Fall precautions discussed with patient. If an FOBT was given today- please return it to our front desk. If you are over 64 years old - you may need Prevnar 62 or the adult Pneumonia vaccine.  **Flu shots are available--- please call and schedule a FLU-CLINIC appointment**  After your visit with [place] today you will receive a survey in the mail or online from [employer] regarding your care with [place]. Please take a moment to fill this out. Your feedback is very important to [place] as you can help [place] better understand your patient needs as well as improve your experience and satisfaction. WE CARE ABOUT YOU!!!   Reduce caffeine intake Drink more water Walk more and get more exercise Do better with blood sugars with diet and exercise We will encourage you to make an appointment to have your eyes examined with Dr. Renford Dills  or Josie Dixon We will make an appointment with a sports medicine doctor to see you about your right lateral epicondylitis and is recurring nature We will call your lab work results as soon as these results become available

## 2017-05-06 NOTE — Progress Notes (Signed)
Subjective:    Patient ID: Austin Graham, male    DOB: April 26, 1951, 66 y.o.   MRN: [id number]  HPI  Pt here for follow up and management of chronic medical problems which includes diabetes, hypertension and hyperlipidemia. He is taking medication regularly.The patient complains of nocturia fatigue and right elbow pain. He is due to return an FOBT in this will be given today. He is due to get lab work and will return fasting for this. His initial blood pressure today was elevated with a reading of 143. His weight today is stable at 207 pounds. His body mass index is 28.3 and the overweight range. The patient complains of arthralgias in multiple joints. His father had rheumatoid arthritis. He is concerned about that diagnosis. He also has urinary frequency and has nocturia 2-3 or 4 times nightly or every couple hours. He admits to poor blood sugar control. He says his blood sugars fasting on Janumet were 150 260 and maybe as high as 170 but he cannot afford to pay for the Janumet at and went back to his metformin. Sugars now may run as high as 225. He denies any chest pain or shortness of breath. He denies any trouble with his stomach including nausea vomiting diarrhea or blood in the stool. His right hip replacement is doing well. He does have increased frequency with nocturia.    Patient Active Problem List   Diagnosis Date Noted  . Status post right hip replacement 08/07/2015  . OA (osteoarthritis) of hip 06/12/2015  . Prostate lump 12/25/2013  . Hypertension 11/30/2013  . Hyperlipidemia 11/30/2013  . Multinodular goiter 11/30/2013  . Nephrolithiasis 11/30/2013  . BPH (benign prostatic hyperplasia) 11/30/2013  . Degenerative disc disease, lumbar 11/30/2013  . Gastroesophageal reflux disease with hiatal hernia 11/30/2013  . Diabetes, poorly controlled    Outpatient Encounter Prescriptions as of 05/06/2017  Medication Sig  . albuterol (PROVENTIL HFA;VENTOLIN HFA) 108 (90 Base) MCG/ACT  inhaler Inhale 2 puffs into the lungs every 6 (six) hours as needed for wheezing or shortness of breath.  Marland Kitchen aspirin 81 MG tablet Take by mouth.  Marland Kitchen atorvastatin (LIPITOR) 40 MG tablet Take 40 mg by mouth daily.  . Cholecalciferol (VITAMIN D) 2000 units CAPS Take 1 capsule by mouth daily.  Marland Kitchen olmesartan (BENICAR) 20 MG tablet Take 10 mg by mouth daily.   . Omega-3 1000 MG CAPS Take by mouth. Patient takes two daily.  . ONE TOUCH ULTRA TEST test strip CHECK BS DAILY AND AS NEEDED. E11.9  . SitaGLIPtin-MetFORMIN HCl (JANUMET XR) [phone] MG TB24 Take 1 tablet by mouth daily.  . Vitamins/Minerals TABS Take by mouth.  . [DISCONTINUED] doxycycline (VIBRA-TABS) 100 MG tablet Take 1 tablet (100 mg total) by mouth 2 (two) times daily.   No facility-administered encounter medications on file as of 05/06/2017.      Review of Systems  Constitutional: Positive for fatigue.  HENT: Negative.   Eyes: Negative.   Respiratory: Negative.   Cardiovascular: Negative.   Gastrointestinal: Negative.   Endocrine: Negative.   Genitourinary: Negative.        Nocturia - up every 2 hours at night  Musculoskeletal: Negative.   Skin: Negative.   Allergic/Immunologic: Negative.   Neurological: Negative.   Hematological: Negative.   Psychiatric/Behavioral: Negative.        Objective:   Physical Exam  Constitutional: He is oriented to person, place, and time. He appears well-developed and well-nourished. No distress.  The patient is pleasant and somewhat anxious when  he comes to the doctor because he is not been doing what he should be doing with his blood sugar control and his help.  HENT:  Head: Normocephalic and atraumatic.  Right Ear: External ear normal.  Left Ear: External ear normal.  Nose: Nose normal.  Mouth/Throat: Oropharynx is clear and moist. No oropharyngeal exudate.  Eyes: Pupils are equal, round, and reactive to light. Conjunctivae and EOM are normal. Right eye exhibits no discharge. Left eye  exhibits no discharge. No scleral icterus.  Neck: Normal range of motion. Neck supple. No thyromegaly present.  No bruits thyromegaly or anterior cervical adenopathy  Cardiovascular: Normal rate, regular rhythm, normal heart sounds and intact distal pulses.   No murmur heard. The heart rhythm was regular with a rate of about 96/m. The patient did admit to having a diet [facility] today.  Pulmonary/Chest: Effort normal and breath sounds normal. No respiratory distress. He has no wheezes. He has no rales. He exhibits no tenderness.  No axillary adenopathy and chest is clear anteriorly and posteriorly  Abdominal: Soft. Bowel sounds are normal. He exhibits no mass. There is no tenderness. There is no rebound and no guarding.  The abdomen was soft without masses tenderness or organ enlargement or inguinal adenopathy  Musculoskeletal: Normal range of motion. He exhibits tenderness. He exhibits no edema.  The patient had right lateral epicondyles tenderness. He's also has some stiffness in the left knee. He is doing well with the right hip that was recently replaced. None of the joints palpated were hot or feverish.  Lymphadenopathy:    He has no cervical adenopathy.  Neurological: He is alert and oriented to person, place, and time. He has normal reflexes. No cranial nerve deficit.  Skin: Skin is warm and dry. No rash noted.  Psychiatric: He has a normal mood and affect. His behavior is normal. Judgment and thought content normal.  Nursing note and vitals reviewed.  BP (!) 143/73 (BP Location: Left Arm)   Pulse (!) 115   Temp (!) 97.2 F (36.2 C) (Oral)   Ht 6' (1.829 m)   Wt 207 lb (93.9 kg)   BMI 28.07 kg/m    Repeat blood pressure 138/74 in the right arm sitting with a large cuff     Assessment & Plan:  1. Type 2 diabetes mellitus without complication, without long-term current use of insulin ([place]) -Patient is not very compliant with his blood sugars and does not check them very  often and does not get or follow up and exercise or diet regimen helps with good control. I stressed the importance to him of dietary management and exercise. - CBC with Differential/Platelet; Future - Bayer DCA Hb A1c Waived; Future  2. Pure hypercholesterolemia -Continue current treatment pending results of lab work - CBC with Differential/Platelet; Future - NMR, lipoprofile; Future  3. Essential hypertension -The blood pressure today was borderline. He needs to do better with weight loss and sodium restriction. - BMP8+EGFR; Future - CBC with Differential/Platelet; Future - Hepatic function panel; Future  4. Vitamin D deficiency -Continue current treatment pending results of lab work - CBC with Differential/Platelet; Future - VITAMIN D 25 Hydroxy (Vit-D Deficiency, Fractures); Future  5. Benign prostatic hyperplasia, unspecified whether lower urinary tract symptoms present -The patient is having urinary frequency. This could be due partially to his diabetes and to his BPH. We will wait before making any further recommendations until all the blood work is returned. - CBC with Differential/Platelet; Future  6. Gastroesophageal reflux  disease with hiatal hernia -No complaints today with reflux. - CBC with Differential/Platelet; Future  7. Nocturia -This is probably secondary to diabetes but also possibly due to BPH. - Urine Culture - Urinalysis, Complete  8. Right elbow pain -Referral to sports medicine for further evaluation and management - CYCLIC CITRUL PEPTIDE ANTIBODY, IGG/IGA; Future - Arthritis Panel; Future - Ambulatory referral to Sports Medicine  9. Left knee pain, unspecified chronicity -Follow-up with orthopedic surgeon, Dr. Ricki Rodriguez.  No orders of the defined types were placed in this encounter.  Patient Instructions  Continue current medications. Continue good therapeutic lifestyle changes which include good diet and exercise. Fall precautions discussed with  patient. If an FOBT was given today- please return it to our front desk. If you are over 38 years old - you may need Prevnar 50 or the adult Pneumonia vaccine.  **Flu shots are available--- please call and schedule a FLU-CLINIC appointment**  After your visit with [place] today you will receive a survey in the mail or online from [employer] regarding your care with [place]. Please take a moment to fill this out. Your feedback is very important to [place] as you can help [place] better understand your patient needs as well as improve your experience and satisfaction. WE CARE ABOUT YOU!!!   Reduce caffeine intake Drink more water Walk more and get more exercise Do better with blood sugars with diet and exercise We will encourage you to make an appointment to have your eyes examined with Dr. Renford Dills  or Josie Dixon We will make an appointment with a sports medicine doctor to see you about your right lateral epicondylitis and is recurring nature We will call your lab work results as soon as these results become available   Arrie Senate MD

## 2017-05-07 LAB — URINE CULTURE

## 2017-05-17 ENCOUNTER — Ambulatory Visit (INDEPENDENT_AMBULATORY_CARE_PROVIDER_SITE_OTHER): Payer: BLUE CROSS/BLUE SHIELD | Admitting: Sports Medicine

## 2017-05-17 ENCOUNTER — Encounter: Payer: Self-pay | Admitting: Sports Medicine

## 2017-05-17 DIAGNOSIS — M7711 Lateral epicondylitis, right elbow: Secondary | ICD-10-CM | POA: Diagnosis not present

## 2017-05-17 NOTE — Assessment & Plan Note (Addendum)
Right elbow pain reproducible with wrist extension suggesting ECRB involvement and consistent with lateral epicondylitis.   -Recommend using bracing/compression sleeve when fishing  -Apply ice, antiinflammatories as needed  -Provided exercises for home use -Rx for physical therapy -- may consider dry needling  -Consider referral to Orthopedic surgery for opinion if not improving

## 2017-05-17 NOTE — Progress Notes (Signed)
   Subjective:    Patient ID: Austin Graham, male    DOB: 1951-09-28, 66 y.o.   MRN: [id number]  HPI 66 yo male presents to clinic with R elbow pain which has been bothersome since February of this year.  Pain is localized to lateral elbow, states it is worse with use and better with rest. Shaking hands causes discomfort.    Describes pain as achy and about a 4-5/10 in severity.  He denies numbness and tingling.  Has not noticed any marked weakness with that hand.  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  Has recently been more active with this as he has qualified for several tournaments back to back.  He reports his elbow sometimes flares up and he has been taking lots of Motrin.  In early April he had seen his PCP at which time he received a cortisone injection and felt much better.   Additionally, he has tried Biofreeze and applying ice, both of which have helped. Uses a brace over his elbow while fishing which has provided some relief.    Review of Systems  Musculoskeletal: Positive for joint swelling.  Neurological: Negative for numbness.     past medical history reviewed Medications reviewed Allergies reviewed Objective:   Physical Exam  66 yo male, NAD BP (!) 146/90   Ht 6' (1.829 m)   Wt 202 lb (91.6 kg)   BMI 27.40 kg/m    R elbow: Absence of warmth and erythema.  No obvious deformities.   Mild soft tissue swelling present.  Normal ROM with flexion, extension, supination and pronation.  Tender to palpation over lateral epicondyle. Reproducibility of pain with wrist extension.  Normal sensation.   Grip strength is equal bilaterally.    U/S right elbow:  Normal elbow joint with possible tear of ECRB noted.      Assessment & Plan:   Lateral epicondylitis Right elbow pain reproducible with wrist extension suggesting ECRB involvement and consistent with lateral epicondylitis.   -Recommend using bracing/compression sleeve when fishing  -Apply ice,  antiinflammatories as needed  -Provided exercises for home use -Rx for physical therapy -- may consider dry needling  -Consider referral to Orthopedic surgery for opinion if not improving  Patient seen and evaluated with the resident. I agree with the above plan of care. 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 He's had a single cortisone injection previously. I explained to him that repeat injections in this area should be avoided. He will try physical therapy. He is a competitive fisherman and I don't think that this is going to improve until he stops fishing. He understands. He'll continue with his counterforce brace. I will have him work with physical therapy. I could refer him to Dr. Amedeo Plenty to discuss surgical debridement if symptoms do not resolve over the next few months. Follow-up as needed.

## 2017-06-23 ENCOUNTER — Other Ambulatory Visit: Payer: Self-pay | Admitting: *Deleted

## 2017-06-23 MED ORDER — SITAGLIP PHOS-METFORMIN HCL ER 100-1000 MG PO TB24: 1.0000 | ORAL_TABLET | Freq: Every day | ORAL | 0 refills | Status: DC

## 2017-07-29 ENCOUNTER — Other Ambulatory Visit: Payer: Self-pay | Admitting: Family Medicine

## 2017-08-18 ENCOUNTER — Other Ambulatory Visit: Payer: Self-pay | Admitting: Family Medicine

## 2017-08-18 NOTE — Telephone Encounter (Signed)
RX in med list 20 mg 1/2 per day  This is for 40 mg  I call Mr Diclemente and he said he takes 40 mg 1/4 tab daily

## 2017-08-18 NOTE — Telephone Encounter (Signed)
[id number] 11/21 dose doesn't match chart

## 2017-09-08 ENCOUNTER — Encounter: Payer: Self-pay | Admitting: Family Medicine

## 2017-09-08 ENCOUNTER — Ambulatory Visit: Payer: BLUE CROSS/BLUE SHIELD | Admitting: Family Medicine

## 2017-09-08 VITALS — BP 103/65 | HR 110 | Temp 97.1°F | Ht 72.0 in | Wt 209.0 lb

## 2017-09-08 DIAGNOSIS — K449 Diaphragmatic hernia without obstruction or gangrene: Secondary | ICD-10-CM

## 2017-09-08 DIAGNOSIS — K219 Gastro-esophageal reflux disease without esophagitis: Secondary | ICD-10-CM | POA: Diagnosis not present

## 2017-09-08 DIAGNOSIS — E7800 Pure hypercholesterolemia, unspecified: Secondary | ICD-10-CM | POA: Diagnosis not present

## 2017-09-08 DIAGNOSIS — E559 Vitamin D deficiency, unspecified: Secondary | ICD-10-CM

## 2017-09-08 DIAGNOSIS — E119 Type 2 diabetes mellitus without complications: Secondary | ICD-10-CM

## 2017-09-08 DIAGNOSIS — Z23 Encounter for immunization: Secondary | ICD-10-CM | POA: Diagnosis not present

## 2017-09-08 DIAGNOSIS — I10 Essential (primary) hypertension: Secondary | ICD-10-CM

## 2017-09-08 MED ORDER — SITAGLIP PHOS-METFORMIN HCL ER 100-1000 MG PO TB24: 1.0000 | ORAL_TABLET | Freq: Every day | ORAL | 11 refills | Status: DC

## 2017-09-08 MED ORDER — MECLIZINE HCL 25 MG PO TABS: 25.0000 mg | ORAL_TABLET | Freq: Three times a day (TID) | ORAL | 1 refills | Status: DC | PRN

## 2017-09-08 NOTE — Progress Notes (Signed)
Subjective:    Patient ID: Austin Graham, male    DOB: 02-28-1951, 66 y.o.   MRN: [id number]  HPI Pt here for follow up and management of chronic medical problems which includes hypertension, diabetes and hyperlipidemia. He is taking medication regularly.  The patient comes in for his regular visit today.  His vital signs are stable but his weight is up 7 pounds.  He is a diabetic.  He is not fasting today and needs to return fasting for his blood work.  He is due to get his flu shot today and an FOBT.  His complaints today are dizziness and increased blood sugar.  At one point in the past when he had to lose weight for his orthopedic surgery he was able to do this but for some reason cannot do this when he is not forced to do it.  The patient said he was working in the yard this past week and after that he has had some dizziness with certain movements.  He denies any head congestion or cough.  He denies any chest pain other than some soreness from working in the yard.  He denies any trouble with swallowing heartburn indigestion nausea vomiting diarrhea or blood in the stool or change in bowel.  His last colonoscopy was in 2011.  He is passing his water well other than nocturia.  He is not getting a lot of exercise and has had some blood sugars as high as in the mid 300 range.  This morning it was 170.  He is taking metformin and Januvia.  He understands that we may need to increase this but we will wait until his A1c is.      Patient Active Problem List   Diagnosis Date Noted  . Lateral epicondylitis of right elbow 05/17/2017  . Status post right hip replacement 08/07/2015  . OA (osteoarthritis) of hip 06/12/2015  . Prostate lump 12/25/2013  . Hypertension 11/30/2013  . Hyperlipidemia 11/30/2013  . Multinodular goiter 11/30/2013  . Nephrolithiasis 11/30/2013  . BPH (benign prostatic hyperplasia) 11/30/2013  . Degenerative disc disease, lumbar 11/30/2013  . Gastroesophageal reflux disease  with hiatal hernia 11/30/2013  . Diabetes, poorly controlled    Outpatient Encounter Medications as of 09/08/2017  Medication Sig  . aspirin 81 MG tablet Take by mouth.  Marland Kitchen atorvastatin (LIPITOR) 80 MG tablet TAKE 1 TABLET (80 MG TOTAL) BY MOUTH DAILY. AS DIRECTED  . Cholecalciferol (VITAMIN D) 2000 units CAPS Take 1 capsule by mouth daily.  Marland Kitchen olmesartan (BENICAR) 40 MG tablet TAKE 1 TABLET BY MOUTH EVERY DAY  . Omega-3 1000 MG CAPS Take by mouth. Patient takes two daily.  . ONE TOUCH ULTRA TEST test strip CHECK BS DAILY AND AS NEEDED. E11.9  . SitaGLIPtin-MetFORMIN HCl (JANUMET XR) [phone] MG TB24 Take 1 tablet by mouth daily.  . Vitamins/Minerals TABS Take by mouth.  . [DISCONTINUED] albuterol (PROVENTIL HFA;VENTOLIN HFA) 108 (90 Base) MCG/ACT inhaler Inhale 2 puffs into the lungs every 6 (six) hours as needed for wheezing or shortness of breath.  . [DISCONTINUED] atorvastatin (LIPITOR) 40 MG tablet Take 40 mg by mouth daily.  . [DISCONTINUED] atorvastatin (LIPITOR) 80 MG tablet TAKE 1 TABLET (80 MG TOTAL) BY MOUTH DAILY. AS DIRECTED   No facility-administered encounter medications on file as of 09/08/2017.      Review of Systems  Constitutional: Negative.   HENT: Negative.   Eyes: Negative.   Respiratory: Negative.   Cardiovascular: Negative.   Gastrointestinal: Negative.  Endocrine: Negative.        Some elevated BS readings  Genitourinary: Negative.   Musculoskeletal: Negative.   Skin: Negative.   Allergic/Immunologic: Negative.   Neurological: Positive for dizziness.  Hematological: Negative.   Psychiatric/Behavioral: Negative.        Objective:   Physical Exam  Constitutional: He is oriented to person, place, and time. He appears well-developed and well-nourished. No distress.  Patient is pleasant and alert  HENT:  Head: Normocephalic and atraumatic.  Right Ear: External ear normal.  Left Ear: External ear normal.  Nose: Nose normal.  Mouth/Throat: Oropharynx  is clear and moist. No oropharyngeal exudate.  Eyes: Conjunctivae and EOM are normal. Pupils are equal, round, and reactive to light. Right eye exhibits no discharge. Left eye exhibits no discharge. No scleral icterus.  Neck: Normal range of motion. Neck supple. No thyromegaly present.  No bruits thyromegaly or anterior cervical adenopathy  Cardiovascular: Normal rate, regular rhythm and intact distal pulses.  No murmur heard. Heart is regular at 72/min  Pulmonary/Chest: Effort normal and breath sounds normal. No respiratory distress. He has no wheezes. He has no rales. He exhibits no tenderness.  Clear anteriorly and posteriorly and no axillary adenopathy  Abdominal: Soft. Bowel sounds are normal. He exhibits no mass. There is no tenderness. There is no rebound and no guarding.  No abdominal tenderness masses bruits or organ enlargement  Musculoskeletal: Normal range of motion. He exhibits no edema.  Lymphadenopathy:    He has no cervical adenopathy.  Neurological: He is alert and oriented to person, place, and time. He has normal reflexes. No cranial nerve deficit.  Skin: Skin is warm and dry. No rash noted.  Mild psoriasis right arm  Psychiatric: He has a normal mood and affect. His behavior is normal. Judgment and thought content normal.  Nursing note and vitals reviewed.  BP 103/65 (BP Location: Left Arm)   Pulse (!) 110   Temp (!) 97.1 F (36.2 C) (Oral)   Ht 6' (1.829 m)   Wt 209 lb (94.8 kg)   BMI 28.35 kg/m         Assessment & Plan:  1. Type 2 diabetes mellitus without complication, without long-term current use of insulin (HCC) -Exercise and watch diet more closely check blood sugars more regularly - BMP8+EGFR; Future - CBC with Differential/Platelet; Future - Bayer DCA Hb A1c Waived; Future  2. Pure hypercholesterolemia -Continue current treatment and aggressive therapeutic lifestyle changes pending results of lab work - CBC with Differential/Platelet; Future -  Lipid panel; Future  3. Essential hypertension -Continue current treatment pending results of lab work - BMP8+EGFR; Future - CBC with Differential/Platelet; Future - Hepatic function panel; Future  4. Vitamin D deficiency -Continue current treatment pending results of lab work - CBC with Differential/Platelet; Future - VITAMIN D 25 Hydroxy (Vit-D Deficiency, Fractures); Future  5. Gastroesophageal reflux disease with hiatal hernia -Continue to watch diet closely and avoid irritating foods - CBC with Differential/Platelet; Future - Hepatic function panel; Future  6. Need for immunization against influenza - Flu Vaccine QUAD 36+ mos IM  Patient Instructions  Continue current medications. Continue good therapeutic lifestyle changes which include good diet and exercise. Fall precautions discussed with patient. If an FOBT was given today- please return it to our front desk. If you are over 50 years old - you may need Prevnar 35 or the adult Pneumonia vaccine.  **Flu shots are available--- please call and schedule a FLU-CLINIC appointment**  After your visit with  [place] today you will receive a survey in the mail or online from [employer] regarding your care with [place]. Please take a moment to fill this out. Your feedback is very important to [place] as you can help [place] better understand your patient needs as well as improve your experience and satisfaction. WE CARE ABOUT YOU!!!   More exercise! Better diet habits! Blood sugars fasting and 4 hours after eating or before the next meal We will call with lab work results and make any recommendations regarding the blood sugar at that time We will also give you a prescription for meclizine 12.5 mg to take one with each meal 3 times daily for 1 week if the dizziness persists. Drink plenty of fluids and stay well hydrated Exercise regularly Push the stroller!   Do not forget to get your eye exam.  Diabetics need an eye exam on a yearly basis!  Arrie Senate MD

## 2017-09-08 NOTE — Addendum Note (Signed)
Addended by: Zannie Cove on: 09/08/2017 05:31 PM   Modules accepted: Orders

## 2017-09-08 NOTE — Patient Instructions (Addendum)
Continue current medications. Continue good therapeutic lifestyle changes which include good diet and exercise. Fall precautions discussed with patient. If an FOBT was given today- please return it to our front desk. If you are over 64 years old - you may need Prevnar 22 or the adult Pneumonia vaccine.  **Flu shots are available--- please call and schedule a FLU-CLINIC appointment**  After your visit with [place] today you will receive a survey in the mail or online from [employer] regarding your care with [place]. Please take a moment to fill this out. Your feedback is very important to [place] as you can help [place] better understand your patient needs as well as improve your experience and satisfaction. WE CARE ABOUT YOU!!!   More exercise! Better diet habits! Blood sugars fasting and 4 hours after eating or before the next meal We will call with lab work results and make any recommendations regarding the blood sugar at that time We will also give you a prescription for meclizine 12.5 mg to take one with each meal 3 times daily for 1 week if the dizziness persists. Drink plenty of fluids and stay well hydrated Exercise regularly Push the stroller!   Do not forget to get your eye exam.  Diabetics need an eye exam on a yearly basis!

## 2017-10-29 ENCOUNTER — Other Ambulatory Visit: Payer: BLUE CROSS/BLUE SHIELD

## 2017-10-29 DIAGNOSIS — E119 Type 2 diabetes mellitus without complications: Secondary | ICD-10-CM

## 2017-10-29 DIAGNOSIS — K219 Gastro-esophageal reflux disease without esophagitis: Secondary | ICD-10-CM

## 2017-10-29 DIAGNOSIS — E559 Vitamin D deficiency, unspecified: Secondary | ICD-10-CM

## 2017-10-29 DIAGNOSIS — E7800 Pure hypercholesterolemia, unspecified: Secondary | ICD-10-CM

## 2017-10-29 DIAGNOSIS — I10 Essential (primary) hypertension: Secondary | ICD-10-CM

## 2017-10-29 DIAGNOSIS — N400 Enlarged prostate without lower urinary tract symptoms: Secondary | ICD-10-CM

## 2017-10-29 DIAGNOSIS — K449 Diaphragmatic hernia without obstruction or gangrene: Secondary | ICD-10-CM

## 2017-10-29 LAB — BAYER DCA HB A1C WAIVED: HB A1C: 9.7 % — AB (ref <7.0)

## 2017-10-30 LAB — HEPATIC FUNCTION PANEL
ALBUMIN: 4.5 g/dL (ref 3.6–4.8)
ALT: 24 IU/L (ref 0–44)
AST: 17 IU/L (ref 0–40)
Alkaline Phosphatase: 103 IU/L (ref 39–117)
BILIRUBIN TOTAL: 0.6 mg/dL (ref 0.0–1.2)
Bilirubin, Direct: 0.17 mg/dL (ref 0.00–0.40)
TOTAL PROTEIN: 7.1 g/dL (ref 6.0–8.5)

## 2017-10-30 LAB — CBC WITH DIFFERENTIAL/PLATELET
BASOS ABS: 0 10*3/uL (ref 0.0–0.2)
Basos: 1 %
EOS (ABSOLUTE): 0.2 10*3/uL (ref 0.0–0.4)
Eos: 3 %
HEMATOCRIT: 42 % (ref 37.5–51.0)
HEMOGLOBIN: 14.2 g/dL (ref 13.0–17.7)
IMMATURE GRANULOCYTES: 0 %
Immature Grans (Abs): 0 10*3/uL (ref 0.0–0.1)
LYMPHS ABS: 2.4 10*3/uL (ref 0.7–3.1)
LYMPHS: 31 %
MCH: 30.5 pg (ref 26.6–33.0)
MCHC: 33.8 g/dL (ref 31.5–35.7)
MCV: 90 fL (ref 79–97)
MONOCYTES: 5 %
Monocytes Absolute: 0.4 10*3/uL (ref 0.1–0.9)
NEUTROS PCT: 60 %
Neutrophils Absolute: 4.7 10*3/uL (ref 1.4–7.0)
Platelets: 251 10*3/uL (ref 150–379)
RBC: 4.65 x10E6/uL (ref 4.14–5.80)
RDW: 13.9 % (ref 12.3–15.4)
WBC: 7.7 10*3/uL (ref 3.4–10.8)

## 2017-10-30 LAB — LIPID PANEL
CHOL/HDL RATIO: 4 ratio (ref 0.0–5.0)
CHOLESTEROL TOTAL: 137 mg/dL (ref 100–199)
HDL: 34 mg/dL — ABNORMAL LOW (ref >39)
LDL CALC: 78 mg/dL (ref 0–99)
Triglycerides: 124 mg/dL (ref 0–149)
VLDL CHOLESTEROL CAL: 25 mg/dL (ref 5–40)

## 2017-10-30 LAB — BMP8+EGFR
BUN/Creatinine Ratio: 18 (ref 10–24)
BUN: 20 mg/dL (ref 8–27)
CALCIUM: 10 mg/dL (ref 8.6–10.2)
CO2: 23 mmol/L (ref 20–29)
Chloride: 103 mmol/L (ref 96–106)
Creatinine, Ser: 1.1 mg/dL (ref 0.76–1.27)
GFR calc Af Amer: 81 mL/min/{1.73_m2} (ref >59)
GFR calc non Af Amer: 70 mL/min/{1.73_m2} (ref >59)
GLUCOSE: 205 mg/dL — AB (ref 65–99)
Potassium: 4.8 mmol/L (ref 3.5–5.2)
Sodium: 142 mmol/L (ref 134–144)

## 2017-10-30 LAB — VITAMIN D 25 HYDROXY (VIT D DEFICIENCY, FRACTURES): Vit D, 25-Hydroxy: 52.3 ng/mL (ref 30.0–100.0)

## 2017-11-05 ENCOUNTER — Other Ambulatory Visit: Payer: Self-pay | Admitting: *Deleted

## 2017-11-05 MED ORDER — METFORMIN HCL 1000 MG PO TABS: 1000.0000 mg | ORAL_TABLET | Freq: Every day | ORAL | 1 refills | Status: DC

## 2017-11-19 ENCOUNTER — Other Ambulatory Visit: Payer: Self-pay | Admitting: Family Medicine

## 2017-12-08 ENCOUNTER — Telehealth: Payer: Self-pay | Admitting: Family Medicine

## 2017-12-08 MED ORDER — SITAGLIP PHOS-METFORMIN HCL ER 50-1000 MG PO TB24: 1.0000 | ORAL_TABLET | Freq: Two times a day (BID) | ORAL | 3 refills | Status: DC

## 2017-12-08 NOTE — Telephone Encounter (Signed)
New med dose sent in

## 2018-01-05 ENCOUNTER — Encounter: Payer: Self-pay | Admitting: Family Medicine

## 2018-01-05 ENCOUNTER — Ambulatory Visit: Payer: BLUE CROSS/BLUE SHIELD | Admitting: Family Medicine

## 2018-01-05 VITALS — BP 111/62 | HR 91 | Temp 96.9°F | Ht 72.0 in | Wt 207.0 lb

## 2018-01-05 DIAGNOSIS — R351 Nocturia: Secondary | ICD-10-CM | POA: Diagnosis not present

## 2018-01-05 DIAGNOSIS — K449 Diaphragmatic hernia without obstruction or gangrene: Secondary | ICD-10-CM | POA: Diagnosis not present

## 2018-01-05 DIAGNOSIS — Z1211 Encounter for screening for malignant neoplasm of colon: Secondary | ICD-10-CM

## 2018-01-05 DIAGNOSIS — E119 Type 2 diabetes mellitus without complications: Secondary | ICD-10-CM | POA: Diagnosis not present

## 2018-01-05 DIAGNOSIS — N400 Enlarged prostate without lower urinary tract symptoms: Secondary | ICD-10-CM

## 2018-01-05 DIAGNOSIS — E559 Vitamin D deficiency, unspecified: Secondary | ICD-10-CM

## 2018-01-05 DIAGNOSIS — Z800 Family history of malignant neoplasm of digestive organs: Secondary | ICD-10-CM | POA: Diagnosis not present

## 2018-01-05 DIAGNOSIS — K219 Gastro-esophageal reflux disease without esophagitis: Secondary | ICD-10-CM

## 2018-01-05 DIAGNOSIS — I10 Essential (primary) hypertension: Secondary | ICD-10-CM

## 2018-01-05 DIAGNOSIS — E7800 Pure hypercholesterolemia, unspecified: Secondary | ICD-10-CM

## 2018-01-05 DIAGNOSIS — E049 Nontoxic goiter, unspecified: Secondary | ICD-10-CM

## 2018-01-05 MED ORDER — DULAGLUTIDE 0.75 MG/0.5ML ~~LOC~~ SOAJ: 0.7500 mg | SUBCUTANEOUS | 3 refills | Status: DC

## 2018-01-05 NOTE — Patient Instructions (Addendum)
Medicare Annual Wellness Visit  [facility] and the medical providers at [facility] strive to bring you the best medical care.  In doing so we not only want to address your current medical conditions and concerns but also to detect new conditions early and prevent illness, disease and health-related problems.    Medicare offers a yearly Wellness Visit which allows our clinical staff to assess your need for preventative services including immunizations, lifestyle education, counseling to decrease risk of preventable diseases and screening for fall risk and other medical concerns.    This visit is provided free of charge (no copay) for all Medicare recipients. The clinical pharmacists at [facility] have begun to conduct these Wellness Visits which will also include a thorough review of all your medications.    As you primary medical provider recommend that you make an appointment for your Annual Wellness Visit if you have not done so already this year.  You may set up this appointment before you leave today or you may call back ([phone]) and schedule an appointment.  Please make sure when you call that you mention that you are scheduling your Annual Wellness Visit with the clinical pharmacist so that the appointment may be made for the proper length of time.     Continue current medications. Continue good therapeutic lifestyle changes which include good diet and exercise. Fall precautions discussed with patient. If an FOBT was given today- please return it to our front desk. If you are over 32 years old - you may need Prevnar 30 or the adult Pneumonia vaccine.  **Flu shots are available--- please call and schedule a FLU-CLINIC appointment**  After your visit with [place] today you will receive a survey in the mail or online from [employer] regarding your care with [place]. Please take a moment to fill this out. Your feedback is very  important to [place] as you can help [place] better understand your patient needs as well as improve your experience and satisfaction. WE CARE ABOUT YOU!!!   Appointment with the urologist that was arranged with you during the visit today We will call and set up an appointment with Dr. Billie Lade for your colonoscopy since her mother had a positive history of colon cancer Try to become more active and exercise more regularly with simply walking Watch diet more closely Get prescription filled and we will have you come to the office and give you your first shot to try to get your blood sugar under better control and they will supervised this and make sure you do it correctly. Return to the office in 4-6 weeks for review of blood sugars and at that time will make a decision about whether we need to increase the dosage of the medication to get better control Continue with current treatment.

## 2018-01-05 NOTE — Progress Notes (Signed)
Subjective:    Patient ID: Austin Graham, male    DOB: 07-08-1951, 67 y.o.   MRN: [id number]  HPI Pt here for follow up and management of chronic medical problems which includes diabetes, hypertension and hyperlipidemia. He is taking medication regularly.  The patient is doing well overall and has no specific complaints today.  He is recently had blood work done and this will be reviewed with him during the visit today.  His hemoglobin A1c was way out of line and way out of range at 9.7%.  The blood sugar was elevated at 205.  The creatinine, was good and all the electrolytes were normal.  The CBC was within normal limits with a good hemoglobin and stable white count.  All liver function tests were normal.  The vitamin D level was good at 52.3.  He will be asked to stay on his vitamin D.  Cholesterol numbers remain good with an LDL C being 78 triglycerides being good at 124 but the good cholesterol being low at 34.  These will be reviewed with him during the visit today.  In the past when the patient had to have surgery and lose weight he got his hemoglobin A1c down into the 6 range.  It is hard to get him motivated.  He knows what he has to do to get better blood sugar control but just seems to have trouble getting this done.  Today he denies any chest pain or shortness of breath.  He denies any trouble with his stomach including nausea vomiting diarrhea blood in the stool or black tarry bowel movements.  He is passing his water well but just goes frequently even with nocturia which keeps him awake a lot at nighttime.  As far as his colonoscopy is concerned there is a family history of colon cancer in his mother has had several inches of her colon removed because of colon cancer in this patient's last colonoscopy was in 2011.  He is past due.  He says that his fasting blood sugars have been running in the 160 range and sometimes higher.  He somehow missed an appointment for follow-up with the urologist at  [facility] and is says that he will call back and make arrangements to do a follow-up with that urologist.     Patient Active Problem List   Diagnosis Date Noted  . Lateral epicondylitis of right elbow 05/17/2017  . Status post right hip replacement 08/07/2015  . OA (osteoarthritis) of hip 06/12/2015  . Prostate lump 12/25/2013  . Hypertension 11/30/2013  . Hyperlipidemia 11/30/2013  . Multinodular goiter 11/30/2013  . Nephrolithiasis 11/30/2013  . BPH (benign prostatic hyperplasia) 11/30/2013  . Degenerative disc disease, lumbar 11/30/2013  . Gastroesophageal reflux disease with hiatal hernia 11/30/2013  . Diabetes, poorly controlled    Outpatient Encounter Medications as of 01/05/2018  Medication Sig  . aspirin 81 MG tablet Take by mouth.  Marland Kitchen atorvastatin (LIPITOR) 80 MG tablet TAKE 1 TABLET (80 MG TOTAL) BY MOUTH DAILY. AS DIRECTED  . Cholecalciferol (VITAMIN D) 2000 units CAPS Take 1 capsule by mouth daily.  . meclizine (ANTIVERT) 25 MG tablet Take 1 tablet (25 mg total) by mouth 3 (three) times daily as needed for dizziness.  Marland Kitchen olmesartan (BENICAR) 40 MG tablet TAKE 1 TABLET BY MOUTH EVERY DAY  . Omega-3 1000 MG CAPS Take by mouth. Patient takes two daily.  . ONE TOUCH ULTRA TEST test strip CHECK BS DAILY AND AS NEEDED.  E11.9  . SitaGLIPtin-MetFORMIN HCl 50-1000 MG TB24 Take 1 tablet by mouth 2 (two) times daily.  . Vitamins/Minerals TABS Take by mouth.   No facility-administered encounter medications on file as of 01/05/2018.      Review of Systems  Constitutional: Negative.   HENT: Negative.   Eyes: Negative.   Respiratory: Negative.   Cardiovascular: Negative.   Gastrointestinal: Negative.   Endocrine: Negative.   Genitourinary: Negative.   Musculoskeletal: Negative.   Skin: Negative.   Allergic/Immunologic: Negative.   Neurological: Negative.   Hematological: Negative.   Psychiatric/Behavioral: Negative.        Objective:     Physical Exam  Constitutional: He is oriented to person, place, and time. He appears well-developed and well-nourished. No distress.  The patient is pleasant and somewhat remorseful that he has not done better with exercise to get better blood sugar control.  HENT:  Head: Normocephalic and atraumatic.  Right Ear: External ear normal.  Left Ear: External ear normal.  Nose: Nose normal.  Mouth/Throat: Oropharynx is clear and moist. No oropharyngeal exudate.  Eyes: Pupils are equal, round, and reactive to light. Conjunctivae and EOM are normal. Right eye exhibits no discharge. Left eye exhibits no discharge. No scleral icterus.  Neck: Normal range of motion. Neck supple. No thyromegaly present.  No bruits thyromegaly or anterior cervical adenopathy  Cardiovascular: Normal rate, regular rhythm, normal heart sounds and intact distal pulses.  No murmur heard. Heart is regular at 84/min  Pulmonary/Chest: Effort normal and breath sounds normal. No respiratory distress. He has no wheezes. He has no rales. He exhibits no tenderness.  Clear anteriorly and posteriorly and no axillary adenopathy  Abdominal: Soft. Bowel sounds are normal. He exhibits no mass. There is no tenderness. There is no rebound and no guarding.  No liver or spleen enlargement no tenderness no inguinal adenopathy and no masses  Genitourinary:  Genitourinary Comments: While the patient was in the office today we went ahead and arrange an appointment with his urologist at [facility] for a date in May.  Musculoskeletal: Normal range of motion. He exhibits no edema.  Lymphadenopathy:    He has no cervical adenopathy.  Neurological: He is alert and oriented to person, place, and time. He has normal reflexes. No cranial nerve deficit.  Skin: Skin is warm and dry. No rash noted.  Psychiatric: He has a normal mood and affect. His behavior is normal. Judgment and thought content normal.  Nursing note  and vitals reviewed.  BP 111/62 (BP Location: Left Arm)   Pulse 91   Temp (!) 96.9 F (36.1 C) (Oral)   Ht 6' (1.829 m)   Wt 207 lb (93.9 kg)   BMI 28.07 kg/m         Assessment & Plan:  1. Type 2 diabetes mellitus without complication, without long-term current use of insulin (HCC) -Sugars under poor control with the last A1c being 9.7%.  We had a long talk about this and the patient will try to do better with diet and exercise. - CBC with Differential/Platelet; Future - Bayer DCA Hb A1c Waived; Future - Microalbumin / creatinine urine ratio  2. Pure hypercholesterolemia -Cholesterol numbers were good and he will continue with current treatment - CBC with Differential/Platelet; Future - Lipid panel; Future  3. Essential hypertension -Blood pressure was good he will continue with current treatment - BMP8+EGFR; Future - CBC with Differential/Platelet; Future - Bayer DCA Hb A1c Waived; Future  4. Vitamin D  deficiency -Continue with vitamin D replacement - CBC with Differential/Platelet; Future - VITAMIN D 25 Hydroxy (Vit-D Deficiency, Fractures); Future  5. Gastroesophageal reflux disease with hiatal hernia -Continue with watching diet closely - CBC with Differential/Platelet; Future - Hepatic function panel; Future  6. Benign prostatic hyperplasia, unspecified whether lower urinary tract symptoms present -Appointment made for for follow-up with urology at [facility] with Differential/Platelet; Future - PSA, total and free; Future - Urinalysis, Complete - Urine Culture  7. Goiter - Thyroid Panel With TSH; Future  8. Nocturia -Appointment with urology - Urinalysis, Complete - Urine Culture  9. Screen for colon cancer -There is a positive family history for colon cancer in his mother and we will arrange for him to have a colonoscopy since he is past due on getting this done. - Ambulatory referral to  Gastroenterology  10. Family history of colon cancer - Ambulatory referral to Gastroenterology   Patient Instructions                       Medicare Annual Wellness Visit  [facility] and the medical providers at [facility] strive to bring you the best medical care.  In doing so we not only want to address your current medical conditions and concerns but also to detect new conditions early and prevent illness, disease and health-related problems.    Medicare offers a yearly Wellness Visit which allows our clinical staff to assess your need for preventative services including immunizations, lifestyle education, counseling to decrease risk of preventable diseases and screening for fall risk and other medical concerns.    This visit is provided free of charge (no copay) for all Medicare recipients. The clinical pharmacists at [facility] have begun to conduct these Wellness Visits which will also include a thorough review of all your medications.    As you primary medical provider recommend that you make an appointment for your Annual Wellness Visit if you have not done so already this year.  You may set up this appointment before you leave today or you may call back ([phone]) and schedule an appointment.  Please make sure when you call that you mention that you are scheduling your Annual Wellness Visit with the clinical pharmacist so that the appointment may be made for the proper length of time.     Continue current medications. Continue good therapeutic lifestyle changes which include good diet and exercise. Fall precautions discussed with patient. If an FOBT was given today- please return it to our front desk. If you are over 61 years old - you may need Prevnar 12 or the adult Pneumonia vaccine.  **Flu shots are available--- please call and schedule a FLU-CLINIC appointment**  After your visit with [place] today you will receive a survey in the  mail or online from [employer] regarding your care with [place]. Please take a moment to fill this out. Your feedback is very important to [place] as you can help [place] better understand your patient needs as well as improve your experience and satisfaction. WE CARE ABOUT YOU!!!   Appointment with the urologist that was arranged with you during the visit today We will call and set up an appointment with Dr. Billie Lade for your colonoscopy since her mother had a positive history of colon cancer Try to become more active and exercise more regularly with simply walking Watch diet more closely Get prescription filled and we will  have you come to the office and give you your first shot to try to get your blood sugar under better control and they will supervised this and make sure you do it correctly. Return to the office in 4-6 weeks for review of blood sugars and at that time will make a decision about whether we need to increase the dosage of the medication to get better control Continue with current treatment.  Arrie Senate MD

## 2018-01-21 ENCOUNTER — Ambulatory Visit: Payer: BLUE CROSS/BLUE SHIELD

## 2018-02-10 ENCOUNTER — Other Ambulatory Visit: Payer: BLUE CROSS/BLUE SHIELD

## 2018-02-10 DIAGNOSIS — K449 Diaphragmatic hernia without obstruction or gangrene: Secondary | ICD-10-CM

## 2018-02-10 DIAGNOSIS — K219 Gastro-esophageal reflux disease without esophagitis: Secondary | ICD-10-CM

## 2018-02-10 DIAGNOSIS — I10 Essential (primary) hypertension: Secondary | ICD-10-CM

## 2018-02-10 DIAGNOSIS — E559 Vitamin D deficiency, unspecified: Secondary | ICD-10-CM

## 2018-02-10 DIAGNOSIS — N400 Enlarged prostate without lower urinary tract symptoms: Secondary | ICD-10-CM

## 2018-02-10 DIAGNOSIS — E7800 Pure hypercholesterolemia, unspecified: Secondary | ICD-10-CM

## 2018-02-10 DIAGNOSIS — E049 Nontoxic goiter, unspecified: Secondary | ICD-10-CM

## 2018-02-10 DIAGNOSIS — E119 Type 2 diabetes mellitus without complications: Secondary | ICD-10-CM

## 2018-02-10 LAB — BAYER DCA HB A1C WAIVED: HB A1C (BAYER DCA - WAIVED): 7.5 % — ABNORMAL HIGH (ref <7.0)

## 2018-02-11 LAB — BMP8+EGFR
BUN/Creatinine Ratio: 15 (ref 10–24)
BUN: 17 mg/dL (ref 8–27)
CO2: 23 mmol/L (ref 20–29)
CREATININE: 1.16 mg/dL (ref 0.76–1.27)
Calcium: 9.9 mg/dL (ref 8.6–10.2)
Chloride: 103 mmol/L (ref 96–106)
GFR calc Af Amer: 76 mL/min/{1.73_m2} (ref >59)
GFR calc non Af Amer: 66 mL/min/{1.73_m2} (ref >59)
GLUCOSE: 120 mg/dL — AB (ref 65–99)
Potassium: 5.1 mmol/L (ref 3.5–5.2)
Sodium: 141 mmol/L (ref 134–144)

## 2018-02-11 LAB — CBC WITH DIFFERENTIAL/PLATELET
Basophils Absolute: 0 10*3/uL (ref 0.0–0.2)
Basos: 1 %
EOS (ABSOLUTE): 0.1 10*3/uL (ref 0.0–0.4)
EOS: 2 %
HEMATOCRIT: 40.2 % (ref 37.5–51.0)
HEMOGLOBIN: 14 g/dL (ref 13.0–17.7)
Immature Grans (Abs): 0 10*3/uL (ref 0.0–0.1)
Immature Granulocytes: 0 %
LYMPHS ABS: 2 10*3/uL (ref 0.7–3.1)
Lymphs: 35 %
MCH: 30.6 pg (ref 26.6–33.0)
MCHC: 34.8 g/dL (ref 31.5–35.7)
MCV: 88 fL (ref 79–97)
MONOS ABS: 0.4 10*3/uL (ref 0.1–0.9)
Monocytes: 7 %
NEUTROS ABS: 3.2 10*3/uL (ref 1.4–7.0)
Neutrophils: 55 %
Platelets: 250 10*3/uL (ref 150–379)
RBC: 4.58 x10E6/uL (ref 4.14–5.80)
RDW: 14.5 % (ref 12.3–15.4)
WBC: 5.8 10*3/uL (ref 3.4–10.8)

## 2018-02-11 LAB — LIPID PANEL
CHOLESTEROL TOTAL: 116 mg/dL (ref 100–199)
Chol/HDL Ratio: 3.1 ratio (ref 0.0–5.0)
HDL: 37 mg/dL — AB (ref >39)
LDL Calculated: 61 mg/dL (ref 0–99)
TRIGLYCERIDES: 91 mg/dL (ref 0–149)
VLDL CHOLESTEROL CAL: 18 mg/dL (ref 5–40)

## 2018-02-11 LAB — THYROID PANEL WITH TSH
FREE THYROXINE INDEX: 1.8 (ref 1.2–4.9)
T3 Uptake Ratio: 28 % (ref 24–39)
T4, Total: 6.4 ug/dL (ref 4.5–12.0)
TSH: 2.71 u[IU]/mL (ref 0.450–4.500)

## 2018-02-11 LAB — PSA, TOTAL AND FREE
PSA, Free Pct: 27.5 %
PSA, Free: 0.11 ng/mL
Prostate Specific Ag, Serum: 0.4 ng/mL (ref 0.0–4.0)

## 2018-02-11 LAB — HEPATIC FUNCTION PANEL
ALBUMIN: 4.7 g/dL (ref 3.6–4.8)
ALT: 27 IU/L (ref 0–44)
AST: 24 IU/L (ref 0–40)
Alkaline Phosphatase: 80 IU/L (ref 39–117)
BILIRUBIN TOTAL: 1.1 mg/dL (ref 0.0–1.2)
BILIRUBIN, DIRECT: 0.3 mg/dL (ref 0.00–0.40)
TOTAL PROTEIN: 6.6 g/dL (ref 6.0–8.5)

## 2018-02-11 LAB — VITAMIN D 25 HYDROXY (VIT D DEFICIENCY, FRACTURES): VIT D 25 HYDROXY: 63 ng/mL (ref 30.0–100.0)

## 2018-02-25 ENCOUNTER — Ambulatory Visit: Payer: BLUE CROSS/BLUE SHIELD | Admitting: Pharmacist Clinician (PhC)/ Clinical Pharmacy Specialist

## 2018-02-28 ENCOUNTER — Encounter: Payer: Self-pay | Admitting: Family Medicine

## 2018-03-26 ENCOUNTER — Other Ambulatory Visit: Payer: Self-pay | Admitting: Family Medicine

## 2018-04-08 ENCOUNTER — Encounter: Payer: Self-pay | Admitting: *Deleted

## 2018-04-20 ENCOUNTER — Ambulatory Visit (INDEPENDENT_AMBULATORY_CARE_PROVIDER_SITE_OTHER): Payer: BLUE CROSS/BLUE SHIELD | Admitting: Physician Assistant

## 2018-04-20 ENCOUNTER — Ambulatory Visit: Payer: BLUE CROSS/BLUE SHIELD | Admitting: Family Medicine

## 2018-04-20 ENCOUNTER — Encounter: Payer: Self-pay | Admitting: Physician Assistant

## 2018-04-20 VITALS — BP 145/83 | HR 91 | Temp 97.4°F | Ht 72.0 in | Wt 209.0 lb

## 2018-04-20 DIAGNOSIS — H5789 Other specified disorders of eye and adnexa: Secondary | ICD-10-CM

## 2018-04-20 MED ORDER — PREDNISOLONE SODIUM PHOSPHATE 1 % OP SOLN: 2.0000 [drp] | Freq: Four times a day (QID) | OPHTHALMIC | 0 refills | Status: DC

## 2018-04-20 NOTE — Patient Instructions (Signed)
In a few days you may receive a survey in the mail or online from [employer] regarding your visit with us today. Please take a moment to fill this out. Your feedback is very important to our whole office. It can help us better understand your needs as well as improve your experience and satisfaction. Thank you for taking your time to complete it. We care about you.  Modena Bellemare, PA-C  

## 2018-04-21 NOTE — Progress Notes (Signed)
BP (!) 145/83   Graham 91   Temp (!) 97.4 F (36.3 C) (Oral)   Ht 6' (1.829 m)   Wt 209 lb (94.8 kg)   BMI 28.35 kg/m    Subjective:    Patient ID: Austin Graham, male    DOB: Jan 05, 1951, 67 y.o.   MRN: [id number]  HPI: Austin Graham is a 67 y.o. male presenting on 04/20/2018 for Feels like something in left (Had preop appt today and they were concerned)  This patient comes in for left eye irritation.  Couple weeks ago he was mowing and did get some debris in his eye.  He was able to get it out.  And things felt somewhat better.  He has had continued irritation throughout that eye.  At times they do drain.  He has seasonal allergy eyes.  Past Medical History:  Diagnosis Date  . Arthritis   . Asthma    as child  . Complication of anesthesia    hard to waken  . Diabetes ([place])    type II  . Diverticulosis   . Dysrhythmia    PAC's  . Goiter   . History of hiatal hernia   . Hyperlipidemia   . Hypertension   . Kidney stones   . Stress fracture    left knee, right hip  . Thyroid disease    Relevant past medical, surgical, family and social history reviewed and updated as indicated. Interim medical history since our last visit reviewed. Allergies and medications reviewed and updated. DATA REVIEWED: CHART IN EPIC  Family History reviewed for pertinent findings.  Review of Systems  Constitutional: Negative.  Negative for appetite change and fatigue.  Eyes: Positive for discharge, redness and itching. Negative for photophobia, pain and visual disturbance.  Respiratory: Negative.  Negative for cough, chest tightness, shortness of breath and wheezing.   Cardiovascular: Negative.  Negative for chest pain, palpitations and leg swelling.  Gastrointestinal: Negative.  Negative for abdominal pain, diarrhea, nausea and vomiting.  Genitourinary: Negative.   Skin: Negative.  Negative for color change and rash.  Neurological: Negative.  Negative for weakness, numbness and  headaches.  Psychiatric/Behavioral: Negative.     Allergies as of 04/20/2018      Reactions   Codeine    REACTION: agitation   Erythromycin    REACTION: diarrhea   Latex    rash   Other    Melon- itchy throat      Medication List        Accurate as of 04/20/18 11:59 PM. Always use your most recent med list.          aspirin 81 MG tablet Take by mouth.   atorvastatin 40 MG tablet Commonly known as:  LIPITOR Take 40 mg by mouth daily.   olmesartan 40 MG tablet Commonly known as:  BENICAR TAKE 1 TABLET BY MOUTH EVERY DAY   Omega-3 1000 MG Caps Take by mouth. Patient takes two daily.   ONE TOUCH ULTRA TEST test strip Generic drug:  glucose blood CHECK BLOOD SUGAR DAILY AND AS NEEDED. E11.9   prednisoLONE sodium phosphate 1 % ophthalmic solution Commonly known as:  INFLAMASE FORTE Place 2 drops into the left eye 4 (four) times daily.   SitaGLIPtin-MetFORMIN HCl 50-1000 MG Tb24 Take 1 tablet by mouth 2 (two) times daily.   Vitamin D 2000 units Caps Take 1 capsule by mouth daily.   Vitamins/Minerals Tabs Take by mouth.  Objective:    BP (!) 145/83   Graham 91   Temp (!) 97.4 F (36.3 C) (Oral)   Ht 6' (1.829 m)   Wt 209 lb (94.8 kg)   BMI 28.35 kg/m   Allergies  Allergen Reactions  . Codeine     REACTION: agitation  . Erythromycin     REACTION: diarrhea  . Latex     rash  . Other     Melon- itchy throat    Wt Readings from Last 3 Encounters:  04/20/18 209 lb (94.8 kg)  01/05/18 207 lb (93.9 kg)  09/08/17 209 lb (94.8 kg)    Physical Exam  Constitutional: He appears well-developed and well-nourished. No distress.  HENT:  Head: Normocephalic and atraumatic.  Eyes: Pupils are equal, round, and reactive to light. EOM are normal. Left eye exhibits discharge. Left eye exhibits no chemosis, no exudate and no hordeolum. No foreign body present in the left eye. Left conjunctiva is injected. Left conjunctiva has no hemorrhage. Left eye  exhibits normal extraocular motion and no nystagmus.  Floor seen dye of the head shows no abrasions or lacerations  Cardiovascular: Normal rate, regular rhythm and normal heart sounds.  Pulmonary/Chest: Effort normal and breath sounds normal. No respiratory distress.  Skin: Skin is warm and dry.  Psychiatric: He has a normal mood and affect. His behavior is normal.  Nursing note and vitals reviewed.       Assessment & Plan:   1. Eye inflamed - prednisoLONE sodium phosphate (INFLAMASE FORTE) 1 % ophthalmic solution; Place 2 drops into the left eye 4 (four) times daily.  Dispense: 10 mL; Refill: 0   Continue all other maintenance medications as listed above.  Follow up plan: No follow-ups on file.  Educational handout given for [place] PA-C [place] [address]  [place], [place] [address] [phone]   04/21/2018, 10:04 AM

## 2018-05-09 ENCOUNTER — Ambulatory Visit (INDEPENDENT_AMBULATORY_CARE_PROVIDER_SITE_OTHER): Payer: BLUE CROSS/BLUE SHIELD | Admitting: Internal Medicine

## 2018-05-09 ENCOUNTER — Encounter: Payer: Self-pay | Admitting: Internal Medicine

## 2018-05-09 VITALS — BP 118/68 | HR 86 | Ht 72.0 in | Wt 210.1 lb

## 2018-05-09 DIAGNOSIS — Z800 Family history of malignant neoplasm of digestive organs: Secondary | ICD-10-CM | POA: Diagnosis not present

## 2018-05-09 MED ORDER — SUPREP BOWEL PREP KIT 17.5-3.13-1.6 GM/177ML PO SOLN: 1.0000 | ORAL | 0 refills | Status: DC

## 2018-05-09 NOTE — Patient Instructions (Signed)
You have been scheduled for a colonoscopy. Please follow written instructions given to you at your visit today.  Please pick up your prep supplies at the pharmacy within the next 1-3 days. If you use inhalers (even only as needed), please bring them with you on the day of your procedure. Your physician has requested that you go to www.startemmi.com and enter the access code given to you at your visit today. This web site gives a general overview about your procedure. However, you should still follow specific instructions given to you by our office regarding your preparation for the procedure.  If you are age 81 or older, your body mass index should be between 23-30. Your Body mass index is 28.5 kg/m. If this is out of the aforementioned range listed, please consider follow up with your Primary Care Provider.  If you are age 29 or younger, your body mass index should be between 19-25. Your Body mass index is 28.5 kg/m. If this is out of the aformentioned range listed, please consider follow up with your Primary Care Provider.

## 2018-05-09 NOTE — Progress Notes (Signed)
Patient ID: Austin Graham, male   DOB: [birth year], 67 y.o.   MRN: [id number] HPI: Austin Graham is a 67 year old male with a past medical history of diverticulosis, diabetes, hypertension, hyperlipidemia and a possible family history of colon cancer in his mother who is seen in consult at the request of Dr. Laurance Flatten to evaluate for repeat colonoscopy.  He is here alone today.  He reports he is doing well denies specific GI complaint.  No change in bowel habit.  No problem with diarrhea or constipation.  No blood in stool or melena.  He denies upper GI and hepatobiliary complaint.  He did recently have a urologic procedure called UroLift for BPH.  This seems to have gone well but he has not seen a great improvement to this point.  He had a colonoscopy performed by Dr. Sharlett Iles on 10/07/2009.  This showed scattered diverticula but no polyps or cancers.  His mother had a colon surgery to have a portion of colon removed.  It is unclear if this was due to malignancy or a benign condition.  His mother also had issues with breast cancer.  Past Medical History:  Diagnosis Date  . Arthritis   . Asthma    as child  . Complication of anesthesia    hard to waken  . Diabetes ([place])    type II  . Diverticulosis   . Dysrhythmia    PAC's  . Goiter   . History of hiatal hernia   . Hyperlipidemia   . Hypertension   . Kidney stones   . Stress fracture    left knee, right hip  . Thyroid disease     Past Surgical History:  Procedure Laterality Date  . CYSTOSCOPY WITH INSERTION OF UROLIFT    . JOINT REPLACEMENT    . KIDNEY STONES  2006  . SKIN CANCER DESTRUCTION Left    deltoid- [facility]  . TONSILLECTOMY    . 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   Procedure: RIGHT TOTAL HIP ARTHROPLASTY ANTERIOR APPROACH;  Surgeon: Gaynelle Arabian, MD;  Location: [facility];  Service: Orthopedics;  Laterality: Right;  . WISDOM TOOTH EXTRACTION      Outpatient Medications Prior to Visit  Medication Sig Dispense Refill  .  aspirin 81 MG tablet Take by mouth.    Marland Kitchen atorvastatin (LIPITOR) 40 MG tablet Take 40 mg by mouth daily.    . Cholecalciferol (VITAMIN D) 2000 units CAPS Take 1 capsule by mouth daily.    . Olmesartan Medoxomil (BENICAR PO) Take 10 mg by mouth daily.    . Omega 3 1200 MG CAPS Take 1,200 mg by mouth daily.    . ONE TOUCH ULTRA TEST test strip CHECK BLOOD SUGAR DAILY AND AS NEEDED. E11.9 100 each 3  . prednisoLONE sodium phosphate (INFLAMASE FORTE) 1 % ophthalmic solution Place 2 drops into the left eye 4 (four) times daily. 10 mL 0  . SitaGLIPtin-MetFORMIN HCl 50-1000 MG TB24 Take 1 tablet by mouth 2 (two) times daily. 180 tablet 3  . Vitamins/Minerals TABS Take by mouth.    . olmesartan (BENICAR) 40 MG tablet TAKE 1 TABLET BY MOUTH EVERY DAY 90 tablet 0  . Omega-3 1000 MG CAPS Take by mouth. Patient takes two daily.     No facility-administered medications prior to visit.     Allergies  Allergen Reactions  . Codeine     REACTION: agitation  . Erythromycin     REACTION: diarrhea  . Latex     rash  .  Other     Melon- itchy throat    Family History  Problem Relation Age of Onset  . Cancer Sister   . Diabetes Sister   . Colon cancer Mother 55  . CAD Father 24       Died age 36 after CABG  . Stroke Father     Social History   Tobacco Use  . Smoking status: Former Smoker    Last attempt to quit: 11/02/1974    Years since quitting: 43.5  . Smokeless tobacco: Never Used  Substance Use Topics  . Alcohol use: Yes    Alcohol/week: 1.0 - 2.0 standard drinks    Types: 1 - 2 Standard drinks or equivalent per week    Comment: 2-3 PER WEEK  . Drug use: Yes    Types: Marijuana    ROS: As per history of present illness, otherwise negative  BP 118/68   Pulse 86   Ht 6' (1.829 m)   Wt 210 lb 2 oz (95.3 kg)   BMI 28.50 kg/m  Constitutional: Well-developed and well-nourished. No distress. HEENT: Normocephalic and atraumatic. Conjunctivae are normal.  No scleral icterus. Neck:  Neck supple. Trachea midline. Cardiovascular: Normal rate, regular rhythm and intact distal pulses. No M/R/G Pulmonary/chest: Effort normal and breath sounds normal. No wheezing, rales or rhonchi. Abdominal: Soft, mild left-sided abdominal tenderness without rebound or guarding, nondistended. Bowel sounds active throughout. There are no masses palpable. No hepatosplenomegaly. Extremities: no clubbing, cyanosis, or edema Neurological: Alert and oriented to person place and time. Skin: Skin is warm and dry.  Psychiatric: Normal mood and affect. Behavior is normal.  RELEVANT LABS AND IMAGING: CBC    Component Value Date/Time   WBC 5.8 02/10/2018 1158   WBC 10.2 06/13/2015 0410   RBC 4.58 02/10/2018 1158   RBC 3.53 (L) 06/13/2015 0410   HGB 14.0 02/10/2018 1158   HCT 40.2 02/10/2018 1158   PLT 250 02/10/2018 1158   MCV 88 02/10/2018 1158   MCH 30.6 02/10/2018 1158   MCH 30.9 06/13/2015 0410   MCHC 34.8 02/10/2018 1158   MCHC 33.6 06/13/2015 0410   RDW 14.5 02/10/2018 1158   LYMPHSABS 2.0 02/10/2018 1158   EOSABS 0.1 02/10/2018 1158   BASOSABS 0.0 02/10/2018 1158    CMP     Component Value Date/Time   NA 141 02/10/2018 1158   K 5.1 02/10/2018 1158   CL 103 02/10/2018 1158   CO2 23 02/10/2018 1158   GLUCOSE 120 (H) 02/10/2018 1158   GLUCOSE 163 (H) 06/13/2015 0410   BUN 17 02/10/2018 1158   CREATININE 1.16 02/10/2018 1158   CREATININE 0.92 11/22/2013 1232   CALCIUM 9.9 02/10/2018 1158   PROT 6.6 02/10/2018 1158   ALBUMIN 4.7 02/10/2018 1158   AST 24 02/10/2018 1158   ALT 27 02/10/2018 1158   ALKPHOS 80 02/10/2018 1158   BILITOT 1.1 02/10/2018 1158   GFRNONAA 66 02/10/2018 1158   GFRNONAA 89 11/22/2013 1232   GFRAA 76 02/10/2018 1158   GFRAA >89 11/22/2013 1232    ASSESSMENT/PLAN: 67 year old male with a past medical history of diverticulosis, diabetes, hypertension, hyperlipidemia and a possible family history of colon cancer in his mother who is seen in consult at  the request of Dr. Laurance Flatten to evaluate for repeat colonoscopy  1. Colon cancer screening/resumed family history of colon cancer in his mother --with the presumption of colon cancer in the patient's mother he would be due for screening colonoscopy at this time.  We discussed  the risk, benefits and alternatives to colonoscopy and he wishes to proceed.  Assuming family history of colon cancer we would repeat colonoscopy every 5 years going forward unless sooner surveillance warranted based on findings.  He prefers this to be done in late October or possibly even early November based on his schedule.     ZD:KEUVH, Estella Husk, [place], [place] [id number]

## 2018-06-03 ENCOUNTER — Ambulatory Visit: Payer: BLUE CROSS/BLUE SHIELD | Admitting: Family Medicine

## 2018-06-03 ENCOUNTER — Telehealth: Payer: Self-pay | Admitting: Family Medicine

## 2018-06-03 NOTE — Telephone Encounter (Signed)
Pt called and appt changed.  °

## 2018-06-21 ENCOUNTER — Ambulatory Visit: Payer: BLUE CROSS/BLUE SHIELD | Admitting: Family Medicine

## 2018-07-12 ENCOUNTER — Encounter: Payer: Self-pay | Admitting: Internal Medicine

## 2018-07-14 ENCOUNTER — Ambulatory Visit: Payer: BLUE CROSS/BLUE SHIELD | Admitting: Family Medicine

## 2018-07-14 ENCOUNTER — Encounter: Payer: Self-pay | Admitting: Family Medicine

## 2018-07-14 ENCOUNTER — Ambulatory Visit (INDEPENDENT_AMBULATORY_CARE_PROVIDER_SITE_OTHER): Payer: BLUE CROSS/BLUE SHIELD

## 2018-07-14 VITALS — BP 142/75 | HR 109 | Temp 97.5°F | Ht 72.0 in | Wt 211.0 lb

## 2018-07-14 DIAGNOSIS — E7800 Pure hypercholesterolemia, unspecified: Secondary | ICD-10-CM

## 2018-07-14 DIAGNOSIS — E559 Vitamin D deficiency, unspecified: Secondary | ICD-10-CM

## 2018-07-14 DIAGNOSIS — I10 Essential (primary) hypertension: Secondary | ICD-10-CM

## 2018-07-14 DIAGNOSIS — E119 Type 2 diabetes mellitus without complications: Secondary | ICD-10-CM | POA: Diagnosis not present

## 2018-07-14 DIAGNOSIS — Z23 Encounter for immunization: Secondary | ICD-10-CM

## 2018-07-14 DIAGNOSIS — K449 Diaphragmatic hernia without obstruction or gangrene: Secondary | ICD-10-CM

## 2018-07-14 DIAGNOSIS — K219 Gastro-esophageal reflux disease without esophagitis: Secondary | ICD-10-CM

## 2018-07-14 NOTE — Patient Instructions (Addendum)
Medicare Annual Wellness Visit  [facility] and the medical providers at [facility] strive to bring you the best medical care.  In doing so we not only want to address your current medical conditions and concerns but also to detect new conditions early and prevent illness, disease and health-related problems.    Medicare offers a yearly Wellness Visit which allows our clinical staff to assess your need for preventative services including immunizations, lifestyle education, counseling to decrease risk of preventable diseases and screening for fall risk and other medical concerns.    This visit is provided free of charge (no copay) for all Medicare recipients. The clinical pharmacists at [facility] have begun to conduct these Wellness Visits which will also include a thorough review of all your medications.    As you primary medical provider recommend that you make an appointment for your Annual Wellness Visit if you have not done so already this year.  You may set up this appointment before you leave today or you may call back ([phone]) and schedule an appointment.  Please make sure when you call that you mention that you are scheduling your Annual Wellness Visit with the clinical pharmacist so that the appointment may be made for the proper length of time.     Continue current medications. Continue good therapeutic lifestyle changes which include good diet and exercise. Fall precautions discussed with patient. If an FOBT was given today- please return it to our front desk. If you are over 65 years old - you may need Prevnar 7 or the adult Pneumonia vaccine.  **Flu shots are available--- please call and schedule a FLU-CLINIC appointment**  After your visit with [place] today you will receive a survey in the mail or online from [employer] regarding your care with [place]. Please take a moment to fill this out. Your feedback is very  important to [place] as you can help [place] better understand your patient needs as well as improve your experience and satisfaction. WE CARE ABOUT YOU!!!   Get colonoscopy as planned Follow-up with urologist as planned Drink plenty of fluids and stay well-hydrated Exercise is much as possible and continued to watch diet to help lose weight. Please make an appointment and get your eyes examined.  Dr. Josie Dixon or  [facility] urology, Dr. Pilar Jarvis

## 2018-07-14 NOTE — Progress Notes (Signed)
Subjective:    Patient ID: Austin Graham, male    DOB: 02/24/1951, 67 y.o.   MRN: [id number]  HPI Pt here for follow up and management of chronic medical problems which includes diabetes and hyperlipidemia. He is taking medication regularly.  She today complains only of left eye irritation.  He is due to return in FOBT and he will get a chest x-ray today.  He will get his blood work when he returns fasting and will get the flu shot today but wait on the Prevnar.  His weight is stable at 211 pounds his BMI is 28.5.  This patient has a history of hyperlipidemia hypertension and diabetes.  He also is on thyroid replacement.  He has had right hip replacement.  She is pleasant and overall feeling well.  He says that his blood sugars at home fasting mid running between 140 and as high as 180.  He has had a UroLift procedure on his prostate at [facility].  He still has nocturia 2-3 times.  He is due to go see the eye doctor.  He is concerned about some kind of Theresia Bough is which sounds like it could be a floater.  Superficially the eye looks fine and there is no sign of any redness or conjunctivitis.  He does not check blood pressures at home and the initial reading today was 142/75.  He denies any chest pain or shortness of breath.  He denies any trouble with his stomach including nausea vomiting diarrhea blood in the stool or black tarry bowel movements.  He is passing his water well but still has nocturia and no blood in the urine.  He is planning to get his colonoscopy next Tuesday by Dr. Hilarie Fredrickson.     Patient Active Problem List   Diagnosis Date Noted  . Lateral epicondylitis of right elbow 05/17/2017  . Status post right hip replacement 08/07/2015  . OA (osteoarthritis) of hip 06/12/2015  . Prostate lump 12/25/2013  . Hypertension 11/30/2013  . Hyperlipidemia 11/30/2013  . Multinodular goiter 11/30/2013  . Nephrolithiasis 11/30/2013  . BPH (benign prostatic hyperplasia) 11/30/2013  .  Degenerative disc disease, lumbar 11/30/2013  . Gastroesophageal reflux disease with hiatal hernia 11/30/2013  . Diabetes, poorly controlled    Outpatient Encounter Medications as of 07/14/2018  Medication Sig  . aspirin 81 MG tablet Take by mouth.  Marland Kitchen atorvastatin (LIPITOR) 40 MG tablet Take 40 mg by mouth daily.  . Cholecalciferol (VITAMIN D) 2000 units CAPS Take 1 capsule by mouth daily.  . Olmesartan Medoxomil (BENICAR PO) Take 10 mg by mouth daily.  . Omega 3 1200 MG CAPS Take 1,200 mg by mouth daily.  . ONE TOUCH ULTRA TEST test strip CHECK BLOOD SUGAR DAILY AND AS NEEDED. E11.9  . prednisoLONE sodium phosphate (INFLAMASE FORTE) 1 % ophthalmic solution Place 2 drops into the left eye 4 (four) times daily.  . SitaGLIPtin-MetFORMIN HCl 50-1000 MG TB24 Take 1 tablet by mouth 2 (two) times daily.  . Vitamins/Minerals TABS Take by mouth.  Manus Gunning BOWEL PREP KIT 17.5-3.13-1.6 GM/177ML SOLN Take 1 kit by mouth as directed. For colonoscopy prep (Patient not taking: Reported on 07/14/2018)   No facility-administered encounter medications on file as of 07/14/2018.      Review of Systems  Constitutional: Negative.   HENT: Negative.   Eyes: Negative.        Left eye irritation  Respiratory: Negative.   Cardiovascular: Negative.   Gastrointestinal: Negative.   Endocrine: Negative.  Genitourinary: Negative.   Musculoskeletal: Negative.   Skin: Negative.   Allergic/Immunologic: Negative.   Neurological: Negative.   Hematological: Negative.   Psychiatric/Behavioral: Negative.        Objective:   Physical Exam  Constitutional: He is oriented to person, place, and time. He appears well-developed and well-nourished. No distress.  The patient is positive and in a good mood.  HENT:  Head: Normocephalic and atraumatic.  Right Ear: External ear normal.  Left Ear: External ear normal.  Nose: Nose normal.  Mouth/Throat: Oropharynx is clear and moist. No oropharyngeal exudate.  Slight  nasal congestion bilaterally  Eyes: Pupils are equal, round, and reactive to light. Conjunctivae and EOM are normal. Right eye exhibits no discharge. Left eye exhibits no discharge. No scleral icterus.  Externally, no obvious findings redness or discharge  Neck: Normal range of motion. Neck supple. No thyromegaly present.  No bruits thyromegaly or anterior cervical adenopathy  Cardiovascular: Normal rate, regular rhythm, normal heart sounds and intact distal pulses.  No murmur heard. Heart is regular at 96/min without murmurs  Pulmonary/Chest: Effort normal and breath sounds normal. He has no wheezes. He has no rales. He exhibits no tenderness.  Clear anteriorly and posteriorly and no axillary adenopathy  Abdominal: Soft. Bowel sounds are normal. He exhibits no mass. There is no tenderness.  No abdominal tenderness masses organ enlargement or bruits  Genitourinary:  Genitourinary Comments: Patient will be seeing a urologist in [place] on a yearly basis.  His former urologist from [facility] has moved to [facility].  He had a procedure in June called a UroLift.  Musculoskeletal: Normal range of motion. He exhibits no edema.  Lymphadenopathy:    He has no cervical adenopathy.  Neurological: He is alert and oriented to person, place, and time. He has normal reflexes. No cranial nerve deficit.  Skin: Skin is warm and dry. No rash noted.  Recent excisions of basal cell skin cancer  Psychiatric: He has a normal mood and affect. His behavior is normal. Judgment and thought content normal.  The patient's mood affect and behavior are all normal for him  Nursing note and vitals reviewed.  BP (!) 142/75 (BP Location: Left Arm)   Pulse (!) 109   Temp (!) 97.5 F (36.4 C) (Oral)   Ht 6' (1.829 m)   Wt 211 lb (95.7 kg)   BMI 28.62 kg/m         Assessment & Plan:  1. Type 2 diabetes mellitus without complication, without long-term current use of insulin ([place]) -For now,  continue with current treatment and aggressive therapeutic lifestyle changes pending results of lab work - CBC with Differential/Platelet; Future - Bayer DCA Hb A1c Waived; Future  2. Pure hypercholesterolemia -Continue with current treatment pending results of lab work - [employer]; Future - CBC with Differential/Platelet; Future - Lipid panel; Future  3. Essential hypertension -Blood pressure is slightly elevated today patient will try to watch sodium intake more closely and lose some weight - DG Chest 2 View; Future - CBC with Differential/Platelet; Future - Hepatic function panel; Future - BMP8+EGFR; Future  4. Vitamin D deficiency -Continue with vitamin D replacement pending results of lab work - CBC with Differential/Platelet; Future - VITAMIN D 25 Hydroxy (Vit-D Deficiency, Fractures); Future  5. Gastroesophageal reflux disease with hiatal hernia -Complaints today with reflux. - CBC with Differential/Platelet; Future   Patient Instructions  Medicare Annual Wellness Visit  [facility] and the medical providers at [facility] strive to bring you the best medical care.  In doing so we not only want to address your current medical conditions and concerns but also to detect new conditions early and prevent illness, disease and health-related problems.    Medicare offers a yearly Wellness Visit which allows our clinical staff to assess your need for preventative services including immunizations, lifestyle education, counseling to decrease risk of preventable diseases and screening for fall risk and other medical concerns.    This visit is provided free of charge (no copay) for all Medicare recipients. The clinical pharmacists at [facility] have begun to conduct these Wellness Visits which will also include a thorough review of all your medications.    As you primary medical provider recommend that you make  an appointment for your Annual Wellness Visit if you have not done so already this year.  You may set up this appointment before you leave today or you may call back ([phone]) and schedule an appointment.  Please make sure when you call that you mention that you are scheduling your Annual Wellness Visit with the clinical pharmacist so that the appointment may be made for the proper length of time.     Continue current medications. Continue good therapeutic lifestyle changes which include good diet and exercise. Fall precautions discussed with patient. If an FOBT was given today- please return it to our front desk. If you are over 50 years old - you may need Prevnar 13 or the adult Pneumonia vaccine.  **Flu shots are available--- please call and schedule a FLU-CLINIC appointment**  After your visit with us today you will receive a survey in the mail or online from [employer] regarding your care with us. Please take a moment to fill this out. Your feedback is very important to us as you can help us better understand your patient needs as well as improve your experience and satisfaction. WE CARE ABOUT YOU!!!   Get colonoscopy as planned Follow-up with urologist as planned Drink plenty of fluids and stay well-hydrated Exercise is much as possible and continued to watch diet to help lose weight. Please make an appointment and get your eyes examined.  Dr. Gigangack or  [facility], Dr. Budzyn  Don W. Moore MD  

## 2018-07-15 ENCOUNTER — Encounter: Payer: Self-pay | Admitting: *Deleted

## 2018-07-21 ENCOUNTER — Encounter: Payer: BLUE CROSS/BLUE SHIELD | Admitting: Internal Medicine

## 2018-07-29 ENCOUNTER — Other Ambulatory Visit: Payer: Self-pay | Admitting: Family Medicine

## 2018-08-09 ENCOUNTER — Other Ambulatory Visit: Payer: BLUE CROSS/BLUE SHIELD

## 2018-08-09 DIAGNOSIS — K219 Gastro-esophageal reflux disease without esophagitis: Secondary | ICD-10-CM

## 2018-08-09 DIAGNOSIS — K449 Diaphragmatic hernia without obstruction or gangrene: Secondary | ICD-10-CM

## 2018-08-09 DIAGNOSIS — E7800 Pure hypercholesterolemia, unspecified: Secondary | ICD-10-CM

## 2018-08-09 DIAGNOSIS — E559 Vitamin D deficiency, unspecified: Secondary | ICD-10-CM

## 2018-08-09 DIAGNOSIS — I10 Essential (primary) hypertension: Secondary | ICD-10-CM

## 2018-08-09 DIAGNOSIS — E119 Type 2 diabetes mellitus without complications: Secondary | ICD-10-CM

## 2018-08-09 LAB — BAYER DCA HB A1C WAIVED: HB A1C: 9.3 % — AB (ref <7.0)

## 2018-08-10 LAB — CBC WITH DIFFERENTIAL/PLATELET
BASOS: 1 %
Basophils Absolute: 0.1 10*3/uL (ref 0.0–0.2)
EOS (ABSOLUTE): 0.2 10*3/uL (ref 0.0–0.4)
EOS: 3 %
HEMATOCRIT: 40.8 % (ref 37.5–51.0)
HEMOGLOBIN: 14.3 g/dL (ref 13.0–17.7)
Immature Grans (Abs): 0 10*3/uL (ref 0.0–0.1)
Immature Granulocytes: 0 %
LYMPHS ABS: 2.3 10*3/uL (ref 0.7–3.1)
Lymphs: 38 %
MCH: 31.2 pg (ref 26.6–33.0)
MCHC: 35 g/dL (ref 31.5–35.7)
MCV: 89 fL (ref 79–97)
MONOS ABS: 0.5 10*3/uL (ref 0.1–0.9)
Monocytes: 9 %
NEUTROS ABS: 3 10*3/uL (ref 1.4–7.0)
Neutrophils: 49 %
Platelets: 282 10*3/uL (ref 150–450)
RBC: 4.58 x10E6/uL (ref 4.14–5.80)
RDW: 12.8 % (ref 12.3–15.4)
WBC: 6.1 10*3/uL (ref 3.4–10.8)

## 2018-08-10 LAB — BMP8+EGFR
BUN/Creatinine Ratio: 12 (ref 10–24)
BUN: 14 mg/dL (ref 8–27)
CALCIUM: 10 mg/dL (ref 8.6–10.2)
CHLORIDE: 102 mmol/L (ref 96–106)
CO2: 22 mmol/L (ref 20–29)
Creatinine, Ser: 1.18 mg/dL (ref 0.76–1.27)
GFR calc non Af Amer: 64 mL/min/{1.73_m2} (ref >59)
GFR, EST AFRICAN AMERICAN: 74 mL/min/{1.73_m2} (ref >59)
Glucose: 153 mg/dL — ABNORMAL HIGH (ref 65–99)
Potassium: 5.1 mmol/L (ref 3.5–5.2)
Sodium: 139 mmol/L (ref 134–144)

## 2018-08-10 LAB — LIPID PANEL
CHOLESTEROL TOTAL: 123 mg/dL (ref 100–199)
Chol/HDL Ratio: 3.4 ratio (ref 0.0–5.0)
HDL: 36 mg/dL — AB (ref >39)
LDL Calculated: 61 mg/dL (ref 0–99)
TRIGLYCERIDES: 129 mg/dL (ref 0–149)
VLDL CHOLESTEROL CAL: 26 mg/dL (ref 5–40)

## 2018-08-10 LAB — HEPATIC FUNCTION PANEL
ALBUMIN: 4.7 g/dL (ref 3.6–4.8)
ALT: 38 IU/L (ref 0–44)
AST: 23 IU/L (ref 0–40)
Alkaline Phosphatase: 91 IU/L (ref 39–117)
BILIRUBIN TOTAL: 0.9 mg/dL (ref 0.0–1.2)
BILIRUBIN, DIRECT: 0.19 mg/dL (ref 0.00–0.40)
TOTAL PROTEIN: 6.9 g/dL (ref 6.0–8.5)

## 2018-08-10 LAB — VITAMIN D 25 HYDROXY (VIT D DEFICIENCY, FRACTURES): VIT D 25 HYDROXY: 43.8 ng/mL (ref 30.0–100.0)

## 2018-08-11 ENCOUNTER — Other Ambulatory Visit: Payer: Self-pay | Admitting: *Deleted

## 2018-08-11 MED ORDER — ICOSAPENT ETHYL 1 G PO CAPS: 2.0000 g | ORAL_CAPSULE | Freq: Two times a day (BID) | ORAL | 1 refills | Status: DC

## 2018-08-11 NOTE — Progress Notes (Signed)
vasce

## 2018-08-16 ENCOUNTER — Telehealth: Payer: Self-pay

## 2018-08-16 NOTE — Telephone Encounter (Signed)
Will have to stay on Lovaza 2 g twice daily.  Patient should continue to check with his insurance to see if coverage may be allowed at some point in the future.

## 2018-08-16 NOTE — Telephone Encounter (Signed)
Insurance denied Optometrist

## 2018-09-16 ENCOUNTER — Encounter: Payer: Self-pay | Admitting: Family Medicine

## 2018-09-16 HISTORY — PX: SKIN CANCER EXCISION: SHX779

## 2018-11-10 ENCOUNTER — Telehealth: Payer: Self-pay | Admitting: Family Medicine

## 2018-11-10 ENCOUNTER — Other Ambulatory Visit: Payer: Self-pay | Admitting: *Deleted

## 2018-11-10 DIAGNOSIS — K219 Gastro-esophageal reflux disease without esophagitis: Secondary | ICD-10-CM

## 2018-11-10 DIAGNOSIS — E7800 Pure hypercholesterolemia, unspecified: Secondary | ICD-10-CM

## 2018-11-10 DIAGNOSIS — K449 Diaphragmatic hernia without obstruction or gangrene: Secondary | ICD-10-CM

## 2018-11-10 DIAGNOSIS — I10 Essential (primary) hypertension: Secondary | ICD-10-CM

## 2018-11-10 DIAGNOSIS — E119 Type 2 diabetes mellitus without complications: Secondary | ICD-10-CM

## 2018-11-10 DIAGNOSIS — E559 Vitamin D deficiency, unspecified: Secondary | ICD-10-CM

## 2018-11-10 DIAGNOSIS — N400 Enlarged prostate without lower urinary tract symptoms: Secondary | ICD-10-CM

## 2018-11-10 NOTE — Telephone Encounter (Signed)
Lab orders placed.  

## 2018-11-11 ENCOUNTER — Other Ambulatory Visit: Payer: BLUE CROSS/BLUE SHIELD

## 2018-11-11 DIAGNOSIS — K219 Gastro-esophageal reflux disease without esophagitis: Secondary | ICD-10-CM

## 2018-11-11 DIAGNOSIS — E559 Vitamin D deficiency, unspecified: Secondary | ICD-10-CM

## 2018-11-11 DIAGNOSIS — K449 Diaphragmatic hernia without obstruction or gangrene: Secondary | ICD-10-CM

## 2018-11-11 DIAGNOSIS — E119 Type 2 diabetes mellitus without complications: Secondary | ICD-10-CM

## 2018-11-11 DIAGNOSIS — I10 Essential (primary) hypertension: Secondary | ICD-10-CM

## 2018-11-11 DIAGNOSIS — E7800 Pure hypercholesterolemia, unspecified: Secondary | ICD-10-CM

## 2018-11-11 DIAGNOSIS — N400 Enlarged prostate without lower urinary tract symptoms: Secondary | ICD-10-CM

## 2018-11-11 LAB — BAYER DCA HB A1C WAIVED: HB A1C (BAYER DCA - WAIVED): 9.1 % — ABNORMAL HIGH (ref <7.0)

## 2018-11-12 LAB — LIPID PANEL
Chol/HDL Ratio: 3.8 ratio (ref 0.0–5.0)
Cholesterol, Total: 135 mg/dL (ref 100–199)
HDL: 36 mg/dL — ABNORMAL LOW (ref >39)
LDL Calculated: 57 mg/dL (ref 0–99)
Triglycerides: 211 mg/dL — ABNORMAL HIGH (ref 0–149)
VLDL Cholesterol Cal: 42 mg/dL — ABNORMAL HIGH (ref 5–40)

## 2018-11-12 LAB — BMP8+EGFR
BUN/Creatinine Ratio: 13 (ref 10–24)
BUN: 15 mg/dL (ref 8–27)
CALCIUM: 10 mg/dL (ref 8.6–10.2)
CO2: 23 mmol/L (ref 20–29)
Chloride: 101 mmol/L (ref 96–106)
Creatinine, Ser: 1.13 mg/dL (ref 0.76–1.27)
GFR calc Af Amer: 78 mL/min/{1.73_m2} (ref >59)
GFR calc non Af Amer: 67 mL/min/{1.73_m2} (ref >59)
Glucose: 158 mg/dL — ABNORMAL HIGH (ref 65–99)
Potassium: 4.6 mmol/L (ref 3.5–5.2)
Sodium: 139 mmol/L (ref 134–144)

## 2018-11-12 LAB — CBC WITH DIFFERENTIAL/PLATELET
Basophils Absolute: 0.1 10*3/uL (ref 0.0–0.2)
Basos: 1 %
EOS (ABSOLUTE): 0.2 10*3/uL (ref 0.0–0.4)
Eos: 2 %
Hematocrit: 42.7 % (ref 37.5–51.0)
Hemoglobin: 14.8 g/dL (ref 13.0–17.7)
Immature Grans (Abs): 0 10*3/uL (ref 0.0–0.1)
Immature Granulocytes: 0 %
LYMPHS ABS: 2.6 10*3/uL (ref 0.7–3.1)
Lymphs: 35 %
MCH: 31.1 pg (ref 26.6–33.0)
MCHC: 34.7 g/dL (ref 31.5–35.7)
MCV: 90 fL (ref 79–97)
Monocytes Absolute: 0.6 10*3/uL (ref 0.1–0.9)
Monocytes: 8 %
NEUTROS ABS: 4 10*3/uL (ref 1.4–7.0)
Neutrophils: 54 %
Platelets: 266 10*3/uL (ref 150–450)
RBC: 4.76 x10E6/uL (ref 4.14–5.80)
RDW: 12.7 % (ref 11.6–15.4)
WBC: 7.3 10*3/uL (ref 3.4–10.8)

## 2018-11-12 LAB — HEPATIC FUNCTION PANEL
ALT: 36 IU/L (ref 0–44)
AST: 22 IU/L (ref 0–40)
Albumin: 4.9 g/dL — ABNORMAL HIGH (ref 3.8–4.8)
Alkaline Phosphatase: 84 IU/L (ref 39–117)
Bilirubin Total: 0.9 mg/dL (ref 0.0–1.2)
Bilirubin, Direct: 0.23 mg/dL (ref 0.00–0.40)
TOTAL PROTEIN: 6.8 g/dL (ref 6.0–8.5)

## 2018-11-12 LAB — VITAMIN D 25 HYDROXY (VIT D DEFICIENCY, FRACTURES): Vit D, 25-Hydroxy: 49.2 ng/mL (ref 30.0–100.0)

## 2018-11-17 ENCOUNTER — Ambulatory Visit: Payer: BLUE CROSS/BLUE SHIELD | Admitting: Family Medicine

## 2018-11-18 ENCOUNTER — Encounter (INDEPENDENT_AMBULATORY_CARE_PROVIDER_SITE_OTHER): Payer: Self-pay

## 2018-11-18 ENCOUNTER — Ambulatory Visit: Payer: BLUE CROSS/BLUE SHIELD | Admitting: Family Medicine

## 2018-11-18 ENCOUNTER — Encounter: Payer: Self-pay | Admitting: Family Medicine

## 2018-11-18 VITALS — BP 114/64 | HR 105 | Temp 97.1°F | Ht 72.0 in | Wt 212.0 lb

## 2018-11-18 DIAGNOSIS — I10 Essential (primary) hypertension: Secondary | ICD-10-CM

## 2018-11-18 DIAGNOSIS — K219 Gastro-esophageal reflux disease without esophagitis: Secondary | ICD-10-CM

## 2018-11-18 DIAGNOSIS — E7800 Pure hypercholesterolemia, unspecified: Secondary | ICD-10-CM | POA: Diagnosis not present

## 2018-11-18 DIAGNOSIS — E559 Vitamin D deficiency, unspecified: Secondary | ICD-10-CM

## 2018-11-18 DIAGNOSIS — E119 Type 2 diabetes mellitus without complications: Secondary | ICD-10-CM

## 2018-11-18 DIAGNOSIS — K449 Diaphragmatic hernia without obstruction or gangrene: Secondary | ICD-10-CM

## 2018-11-18 MED ORDER — ICOSAPENT ETHYL 1 G PO CAPS: 2.0000 | ORAL_CAPSULE | Freq: Two times a day (BID) | ORAL | 3 refills | Status: DC

## 2018-11-18 NOTE — Addendum Note (Signed)
Addended by: Zannie Cove on: 11/18/2018 12:49 PM   Modules accepted: Orders

## 2018-11-18 NOTE — Progress Notes (Signed)
Subjective:    Patient ID: Austin Graham, male    DOB: 1951/03/21, 68 y.o.   MRN: [id number]  HPI Pt here for follow up and management of chronic medical problems which includes diabetes and hypertension and hyperlipidemia. He is taking medication regularly.  This patient has a history of arthritis asthma diabetes mellitus that has been poorly controlled over time hyperlipidemia hypertension kidney stones and thyroid disease.  He is complaining today of some left eye irritation and has not made an appointment to see the eye doctor yet.  He is due to have a colonoscopy in 1 years.  His body mass index is 28.62.  The patient admits to not doing what he should do to control his blood sugars well.  He needs to have more exercise and watch his diet more closely.  We have already planned for him to meet with the clinical pharmacist who is a certified diabetic educator.  He is having problems with his eye and we will once again encourage him to go get his eyes examined and we will give him the names of the eye physicians to call to get this done.  He is only taking Laurann [place] and we would ask him to switch to Vascepa instead because of the stroke and heart risk reductions with this particular medicines.  Today he denies any chest pain.  He has had some increased shortness of breath due to inactivity physically.  He does have occasional heartburn and says he cannot get a colonoscopy done because of the cost until 2021.  He denies any blood in the stool black tarry bowel movements or change in bowel habits.  We will give him an FOBT to return.  He does have urinary frequency but no burning.    Patient Active Problem List   Diagnosis Date Noted  . Lateral epicondylitis of right elbow 05/17/2017  . Status post right hip replacement 08/07/2015  . OA (osteoarthritis) of hip 06/12/2015  . Prostate lump 12/25/2013  . Hypertension 11/30/2013  . Hyperlipidemia 11/30/2013  . Multinodular goiter  11/30/2013  . Nephrolithiasis 11/30/2013  . BPH (benign prostatic hyperplasia) 11/30/2013  . Degenerative disc disease, lumbar 11/30/2013  . Gastroesophageal reflux disease with hiatal hernia 11/30/2013  . Diabetes, poorly controlled    Outpatient Encounter Medications as of 11/18/2018  Medication Sig  . aspirin 81 MG tablet Take by mouth.  Marland Kitchen atorvastatin (LIPITOR) 80 MG tablet TAKE 1 TABLET (80 MG TOTAL) BY MOUTH DAILY. AS DIRECTED  . Cholecalciferol (VITAMIN D) 2000 units CAPS Take 1 capsule by mouth daily.  . Olmesartan Medoxomil (BENICAR PO) Take 10 mg by mouth daily.  . Omega 3 1200 MG CAPS Take 1,200 mg by mouth daily.  . ONE TOUCH ULTRA TEST test strip CHECK BLOOD SUGAR DAILY AND AS NEEDED. E11.9  . SitaGLIPtin-MetFORMIN HCl 50-1000 MG TB24 Take 1 tablet by mouth 2 (two) times daily.  . Vitamins/Minerals TABS Take by mouth.  . [DISCONTINUED] Icosapent Ethyl (VASCEPA) 1 g CAPS Take 2 capsules (2 g total) by mouth 2 (two) times daily.  . [DISCONTINUED] prednisoLONE sodium phosphate (INFLAMASE FORTE) 1 % ophthalmic solution Place 2 drops into the left eye 4 (four) times daily.  . [DISCONTINUED] SUPREP BOWEL PREP KIT 17.5-3.13-1.6 GM/177ML SOLN Take 1 kit by mouth as directed. For colonoscopy prep   No facility-administered encounter medications on file as of 11/18/2018.      Review of Systems  Constitutional: Negative.   HENT: Negative.   Eyes: Positive  for visual disturbance (left eye ).  Respiratory: Negative.   Cardiovascular: Negative.   Gastrointestinal: Negative.   Endocrine: Negative.   Genitourinary: Negative.   Musculoskeletal: Negative.   Skin: Negative.   Allergic/Immunologic: Negative.   Neurological: Negative.   Hematological: Negative.   Psychiatric/Behavioral: Negative.        Objective:   Physical Exam Vitals signs and nursing note reviewed.  Constitutional:      General: He is not in acute distress.    Appearance: Normal appearance. He is  well-developed.     Comments: The patient is pleasant and somewhat stressed because of his poor blood sugar control.  HENT:     Head: Normocephalic and atraumatic.     Right Ear: Tympanic membrane, ear canal and external ear normal. There is no impacted cerumen.     Left Ear: Tympanic membrane, ear canal and external ear normal. There is no impacted cerumen.     Nose: Nose normal. No congestion.     Mouth/Throat:     Mouth: Mucous membranes are dry.     Pharynx: Oropharynx is clear. No oropharyngeal exudate.  Eyes:     General: No scleral icterus.       Right eye: No discharge.        Left eye: No discharge.     Extraocular Movements: Extraocular movements intact.     Conjunctiva/sclera: Conjunctivae normal.     Pupils: Pupils are equal, round, and reactive to light.     Comments: Needs eye exam.  Neck:     Musculoskeletal: Normal range of motion and neck supple.     Thyroid: No thyromegaly.     Vascular: No carotid bruit.     Trachea: No tracheal deviation.     Comments: No bruits thyromegaly or anterior cervical adenopathy Cardiovascular:     Rate and Rhythm: Normal rate and regular rhythm.     Heart sounds: Normal heart sounds. No murmur.     Comments: The heart is regular at 72/min with good pedal pulses and no edema Pulmonary:     Effort: Pulmonary effort is normal. No respiratory distress.     Breath sounds: Normal breath sounds. No wheezing or rales.     Comments: Clear anteriorly and posteriorly with no axillary adenopathy chest wall masses or tenderness Chest:     Chest wall: No tenderness.  Abdominal:     General: Abdomen is flat. Bowel sounds are normal.     Palpations: Abdomen is soft. There is no mass.     Tenderness: There is no abdominal tenderness.     Comments: No liver or spleen enlargement.  No epigastric tenderness.  No masses and good inguinal pulses without adenopathy.  Musculoskeletal: Normal range of motion.        General: No tenderness or deformity.       Right lower leg: No edema.     Left lower leg: No edema.  Lymphadenopathy:     Cervical: No cervical adenopathy.  Skin:    General: Skin is warm and dry.     Findings: No rash.  Neurological:     General: No focal deficit present.     Mental Status: He is alert and oriented to person, place, and time. Mental status is at baseline.     Cranial Nerves: No cranial nerve deficit.     Gait: Gait normal.     Deep Tendon Reflexes: Reflexes are normal and symmetric. Reflexes normal.  Psychiatric:  Mood and Affect: Mood normal.        Behavior: Behavior normal.        Thought Content: Thought content normal.        Judgment: Judgment normal.     Comments: Patient's mood affect and behavior are normal for him.    BP 114/64 (BP Location: Left Arm)   Pulse (!) 105   Temp (!) 97.1 F (36.2 C) (Oral)   Ht 6' (1.829 m)   Wt 212 lb (96.2 kg)   BMI 28.75 kg/m         Assessment & Plan:  1. Type 2 diabetes mellitus without complication, without long-term current use of insulin (HCC) -This is been poorly controlled for a long time.  Patient always seems to come up with excuses that he is going to do better.  He is feeling tired and having no energy I am sure a lot of this is related to his elevated blood sugars.  He is being scheduled to come in and meet with the clinical pharmacist to get him jumpstarted on better control and diet habits.  2. Pure hypercholesterolemia -Continue with statin therapy but add Vascepa and stop the [employer] fish oil  3. Essential hypertension -Blood pressure is good today and he will continue with current treatment  4. Vitamin D deficiency -Continue with vitamin D replacement  5. Gastroesophageal reflux disease with hiatal hernia -Use Pepcid AC on an as needed basis for heartburn and indigestion  Patient Instructions                       Medicare Annual Wellness Visit  [facility] and the medical providers at [facility] strive to bring you the best medical care.  In doing so we not only want to address your current medical conditions and concerns but also to detect new conditions early and prevent illness, disease and health-related problems.    Medicare offers a yearly Wellness Visit which allows our clinical staff to assess your need for preventative services including immunizations, lifestyle education, counseling to decrease risk of preventable diseases and screening for fall risk and other medical concerns.    This visit is provided free of charge (no copay) for all Medicare recipients. The clinical pharmacists at [facility] have begun to conduct these Wellness Visits which will also include a thorough review of all your medications.    As you primary medical provider recommend that you make an appointment for your Annual Wellness Visit if you have not done so already this year.  You may set up this appointment before you leave today or you may call back ([phone]) and schedule an appointment.  Please make sure when you call that you mention that you are scheduling your Annual Wellness Visit with the clinical pharmacist so that the appointment may be made for the proper length of time.    Continue current medications. Continue good therapeutic lifestyle changes which include good diet and exercise. Fall precautions discussed with patient. If an FOBT was given today- please return it to our front desk. If you are over 50 years old - you may need Prevnar 51 or the adult Pneumonia vaccine.  **Flu shots are available--- please call and schedule a FLU-CLINIC appointment**  After your visit with [place] today you will receive a survey in the mail or online from [employer] regarding your care with [place]. Please take a moment to fill this out. Your  feedback is very important to [place] as you can help [place] better understand your patient needs as well as improve your experience and satisfaction. WE  CARE ABOUT YOU!!!   Please do not forget to get your colonoscopy early next year Please try to get more exercise and lose weight with diet and exercise We will switch you from [place] oil to [place] which has evidence based support to reduce stroke and heart attack even in people with diabetes. Please keep appointment with Sharyn Lull the clinical pharmacist and certified diabetic educator to help you work on getting the blood sugar under better control Please get eye exam and make sure we get a copy of the report Your last stress test was done by Dr. Percival Spanish in 2017.  I would like for you to see him about every 4 or 5 years for routine follow-up because of your family history    Arrie Senate MD

## 2018-11-18 NOTE — Patient Instructions (Addendum)
Medicare Annual Wellness Visit  [facility] and the medical providers at [facility] strive to bring you the best medical care.  In doing so we not only want to address your current medical conditions and concerns but also to detect new conditions early and prevent illness, disease and health-related problems.    Medicare offers a yearly Wellness Visit which allows our clinical staff to assess your need for preventative services including immunizations, lifestyle education, counseling to decrease risk of preventable diseases and screening for fall risk and other medical concerns.    This visit is provided free of charge (no copay) for all Medicare recipients. The clinical pharmacists at [facility] have begun to conduct these Wellness Visits which will also include a thorough review of all your medications.    As you primary medical provider recommend that you make an appointment for your Annual Wellness Visit if you have not done so already this year.  You may set up this appointment before you leave today or you may call back ([phone]) and schedule an appointment.  Please make sure when you call that you mention that you are scheduling your Annual Wellness Visit with the clinical pharmacist so that the appointment may be made for the proper length of time.    Continue current medications. Continue good therapeutic lifestyle changes which include good diet and exercise. Fall precautions discussed with patient. If an FOBT was given today- please return it to our front desk. If you are over 66 years old - you may need Prevnar 71 or the adult Pneumonia vaccine.  **Flu shots are available--- please call and schedule a FLU-CLINIC appointment**  After your visit with [place] today you will receive a survey in the mail or online from [employer] regarding your care with [place]. Please take a moment to fill this out. Your feedback is very  important to [place] as you can help [place] better understand your patient needs as well as improve your experience and satisfaction. WE CARE ABOUT YOU!!!   Please do not forget to get your colonoscopy early next year Please try to get more exercise and lose weight with diet and exercise We will switch you from [place] oil to [place] which has evidence based support to reduce stroke and heart attack even in people with diabetes. Please keep appointment with Sharyn Lull the clinical pharmacist and certified diabetic educator to help you work on getting the blood sugar under better control Please get eye exam and make sure we get a copy of the report Your last stress test was done by Dr. Percival Spanish in 2017.  I would like for you to see him about every 4 or 5 years for routine follow-up because of your family history

## 2018-12-14 ENCOUNTER — Ambulatory Visit: Payer: BLUE CROSS/BLUE SHIELD | Admitting: Pharmacist Clinician (PhC)/ Clinical Pharmacy Specialist

## 2018-12-14 ENCOUNTER — Encounter: Payer: Self-pay | Admitting: Pharmacist Clinician (PhC)/ Clinical Pharmacy Specialist

## 2018-12-14 ENCOUNTER — Other Ambulatory Visit: Payer: Self-pay

## 2018-12-14 DIAGNOSIS — E119 Type 2 diabetes mellitus without complications: Secondary | ICD-10-CM

## 2018-12-14 DIAGNOSIS — E7800 Pure hypercholesterolemia, unspecified: Secondary | ICD-10-CM | POA: Diagnosis not present

## 2018-12-14 MED ORDER — SEMAGLUTIDE 3 MG PO TABS: 1.0000 | ORAL_TABLET | Freq: Every day | ORAL | 0 refills | Status: DC

## 2018-12-14 NOTE — Progress Notes (Signed)
Diabetes Follow-Up Visit Chief Complaint:  No chief complaint on file.    Exam Regularity:  RRR Edema:  neg  Polyuria:  positive  Polydipsia:  neg Polyphagia:  neg  BMI:  There is no height or weight on file to calculate BMI.   Weight changes:  Stable 205lb General Appearance:  alert, oriented, no acute distress Mood/Affect:  normal  HPI:  Long standing type 2 diabetes with A1Cs staying in the 9% range with the exception of May 2019 when it was 7.5%  Low fat/carbohydrate diet?  No Nicotine Abuse?  No Medication Compliance?  Yes Exercise?  No Alcohol Abuse?  No  Home BG Monitoring: No routinely checking. Average:  n/a  High: n/a  Low:  n/a   Lab Results  Component Value Date   HGBA1C 9.1 (H) 11/11/2018    Lab Results  Component Value Date   MICROALBUR 20 08/07/2015    Lab Results  Component Value Date   CHOL 135 11/11/2018   HDL 36 (L) 11/11/2018   LDLCALC 57 11/11/2018   TRIG 211 (H) 11/11/2018   CHOLHDL 3.8 11/11/2018      Assessment: 1.  Diabetes:  Continued worsening control with slight elevations in his creatinine and plyuria.  On Janumet 50/1000mg  bid.  Not exercising watches his 71 month old grandson every day. 2.  Blood Pressure.  At goal 3.  Lipids.  LDL 56 mg/dL slight TG elevation from elevated gludose 4.  Foot Care.  Reviewed with patient 5.  Dental Care.  Twice yearly cleanings 6.  Eye Care/Exam.  Annual exams  Recommendations: 1.  Patient is counseled on appropriate foot care. 2.  BP goal < 130/80. 3.  LDL goal of < 100, HDL > 40 and TG < 150. 4.  Eye Exam yearly and Dental Exam every 6 months. 5.  Dietary recommendations:  Counseled patient extensively on CHO meal planning and gave specific written diet and guidelines he is agreeable to following.  He loves desserts 6.  Physical Activity recommendations:  Start walking grandson in stroller every day goal of 20 minutes twice a day 7.  Medication recommendations at this time are as follows:  Add  Rybelsus 3mg  daily. Counseled patient on medication and to continue with Janumet.  Explained BG reduction will occur with increase in 30 days to 7mg  strength. 8.  Return to clinic in 4-6 wks   Time spent counseling patient:  45 min   Physician time spent with patient:  0 Referring provider:  Laurance Flatten   PharmD:  Memory Argue, PharmD, CPP, CLS

## 2018-12-23 ENCOUNTER — Other Ambulatory Visit: Payer: Self-pay | Admitting: Family Medicine

## 2018-12-27 ENCOUNTER — Other Ambulatory Visit: Payer: Self-pay | Admitting: *Deleted

## 2018-12-27 MED ORDER — ICOSAPENT ETHYL 1 G PO CAPS: 2.0000 | ORAL_CAPSULE | Freq: Two times a day (BID) | ORAL | 3 refills | Status: DC

## 2018-12-27 NOTE — Telephone Encounter (Signed)
rf sent for 90 day vascepa.

## 2018-12-29 ENCOUNTER — Telehealth: Payer: Self-pay | Admitting: Family Medicine

## 2018-12-29 NOTE — Telephone Encounter (Signed)
PT states that he went to pick up his JANUMET XR 50-1000 MG TB24 at pharmacy and it was going to cost him $180 or so and he wants to know if Roselyn Reef has any saving cards or anything like that to help lower his rx cost on that medication, he states he got one last year but pharmacy thinks it may have expired.

## 2018-12-29 NOTE — Telephone Encounter (Signed)
Pt aware that we do have a coupon here for him.  Up front .

## 2019-01-07 ENCOUNTER — Other Ambulatory Visit: Payer: Self-pay | Admitting: Family Medicine

## 2019-01-11 ENCOUNTER — Other Ambulatory Visit: Payer: Self-pay | Admitting: Family Medicine

## 2019-03-22 ENCOUNTER — Telehealth: Payer: Self-pay | Admitting: Family Medicine

## 2019-03-23 ENCOUNTER — Other Ambulatory Visit: Payer: Self-pay | Admitting: *Deleted

## 2019-03-23 DIAGNOSIS — I10 Essential (primary) hypertension: Secondary | ICD-10-CM

## 2019-03-23 DIAGNOSIS — E559 Vitamin D deficiency, unspecified: Secondary | ICD-10-CM

## 2019-03-23 DIAGNOSIS — E119 Type 2 diabetes mellitus without complications: Secondary | ICD-10-CM

## 2019-03-23 DIAGNOSIS — K219 Gastro-esophageal reflux disease without esophagitis: Secondary | ICD-10-CM

## 2019-03-23 DIAGNOSIS — E7800 Pure hypercholesterolemia, unspecified: Secondary | ICD-10-CM

## 2019-03-23 NOTE — Telephone Encounter (Signed)
Pt having issues with right foot - spur 13 years ago   Now having pain again. Will let us know if he would like a referral to ortho

## 2019-03-27 ENCOUNTER — Other Ambulatory Visit: Payer: BC Managed Care – PPO

## 2019-03-27 ENCOUNTER — Other Ambulatory Visit: Payer: Self-pay

## 2019-03-27 DIAGNOSIS — K449 Diaphragmatic hernia without obstruction or gangrene: Secondary | ICD-10-CM

## 2019-03-27 DIAGNOSIS — I10 Essential (primary) hypertension: Secondary | ICD-10-CM

## 2019-03-27 DIAGNOSIS — K219 Gastro-esophageal reflux disease without esophagitis: Secondary | ICD-10-CM

## 2019-03-27 DIAGNOSIS — E559 Vitamin D deficiency, unspecified: Secondary | ICD-10-CM

## 2019-03-27 DIAGNOSIS — E7800 Pure hypercholesterolemia, unspecified: Secondary | ICD-10-CM

## 2019-03-27 DIAGNOSIS — E119 Type 2 diabetes mellitus without complications: Secondary | ICD-10-CM

## 2019-03-27 LAB — BAYER DCA HB A1C WAIVED: HB A1C (BAYER DCA - WAIVED): 7.2 % — ABNORMAL HIGH (ref <7.0)

## 2019-03-28 LAB — CBC WITH DIFFERENTIAL/PLATELET
Basophils Absolute: 0.1 10*3/uL (ref 0.0–0.2)
Basos: 1 %
EOS (ABSOLUTE): 0.2 10*3/uL (ref 0.0–0.4)
Eos: 3 %
Hematocrit: 41.7 % (ref 37.5–51.0)
Hemoglobin: 14.6 g/dL (ref 13.0–17.7)
Immature Grans (Abs): 0 10*3/uL (ref 0.0–0.1)
Immature Granulocytes: 0 %
Lymphocytes Absolute: 2 10*3/uL (ref 0.7–3.1)
Lymphs: 31 %
MCH: 31.3 pg (ref 26.6–33.0)
MCHC: 35 g/dL (ref 31.5–35.7)
MCV: 90 fL (ref 79–97)
Monocytes Absolute: 0.5 10*3/uL (ref 0.1–0.9)
Monocytes: 7 %
Neutrophils Absolute: 3.8 10*3/uL (ref 1.4–7.0)
Neutrophils: 58 %
Platelets: 263 10*3/uL (ref 150–450)
RBC: 4.66 x10E6/uL (ref 4.14–5.80)
RDW: 12.7 % (ref 11.6–15.4)
WBC: 6.5 10*3/uL (ref 3.4–10.8)

## 2019-03-28 LAB — LIPID PANEL
Chol/HDL Ratio: 4.2 ratio (ref 0.0–5.0)
Cholesterol, Total: 150 mg/dL (ref 100–199)
HDL: 36 mg/dL — ABNORMAL LOW (ref >39)
LDL Calculated: 83 mg/dL (ref 0–99)
Triglycerides: 155 mg/dL — ABNORMAL HIGH (ref 0–149)
VLDL Cholesterol Cal: 31 mg/dL (ref 5–40)

## 2019-03-28 LAB — HEPATIC FUNCTION PANEL
ALT: 25 IU/L (ref 0–44)
AST: 18 IU/L (ref 0–40)
Albumin: 4.9 g/dL — ABNORMAL HIGH (ref 3.8–4.8)
Alkaline Phosphatase: 89 IU/L (ref 39–117)
Bilirubin Total: 0.7 mg/dL (ref 0.0–1.2)
Bilirubin, Direct: 0.18 mg/dL (ref 0.00–0.40)
Total Protein: 6.8 g/dL (ref 6.0–8.5)

## 2019-03-28 LAB — BMP8+EGFR
BUN/Creatinine Ratio: 17 (ref 10–24)
BUN: 20 mg/dL (ref 8–27)
CO2: 21 mmol/L (ref 20–29)
Calcium: 10 mg/dL (ref 8.6–10.2)
Chloride: 101 mmol/L (ref 96–106)
Creatinine, Ser: 1.15 mg/dL (ref 0.76–1.27)
GFR calc Af Amer: 76 mL/min/{1.73_m2} (ref >59)
GFR calc non Af Amer: 66 mL/min/{1.73_m2} (ref >59)
Glucose: 147 mg/dL — ABNORMAL HIGH (ref 65–99)
Potassium: 4.9 mmol/L (ref 3.5–5.2)
Sodium: 140 mmol/L (ref 134–144)

## 2019-03-28 LAB — VITAMIN D 25 HYDROXY (VIT D DEFICIENCY, FRACTURES): Vit D, 25-Hydroxy: 61.5 ng/mL (ref 30.0–100.0)

## 2019-03-29 ENCOUNTER — Encounter: Payer: Self-pay | Admitting: Family Medicine

## 2019-03-29 ENCOUNTER — Ambulatory Visit (INDEPENDENT_AMBULATORY_CARE_PROVIDER_SITE_OTHER): Payer: BC Managed Care – PPO | Admitting: Family Medicine

## 2019-03-29 ENCOUNTER — Other Ambulatory Visit: Payer: Self-pay | Admitting: *Deleted

## 2019-03-29 ENCOUNTER — Other Ambulatory Visit: Payer: Self-pay

## 2019-03-29 ENCOUNTER — Other Ambulatory Visit: Payer: Self-pay | Admitting: Family Medicine

## 2019-03-29 DIAGNOSIS — N400 Enlarged prostate without lower urinary tract symptoms: Secondary | ICD-10-CM

## 2019-03-29 DIAGNOSIS — K219 Gastro-esophageal reflux disease without esophagitis: Secondary | ICD-10-CM | POA: Diagnosis not present

## 2019-03-29 DIAGNOSIS — Z9189 Other specified personal risk factors, not elsewhere classified: Secondary | ICD-10-CM

## 2019-03-29 DIAGNOSIS — N200 Calculus of kidney: Secondary | ICD-10-CM | POA: Diagnosis not present

## 2019-03-29 DIAGNOSIS — M5136 Other intervertebral disc degeneration, lumbar region: Secondary | ICD-10-CM | POA: Diagnosis not present

## 2019-03-29 DIAGNOSIS — Z800 Family history of malignant neoplasm of digestive organs: Secondary | ICD-10-CM

## 2019-03-29 DIAGNOSIS — E782 Mixed hyperlipidemia: Secondary | ICD-10-CM

## 2019-03-29 DIAGNOSIS — I10 Essential (primary) hypertension: Secondary | ICD-10-CM | POA: Diagnosis not present

## 2019-03-29 DIAGNOSIS — E559 Vitamin D deficiency, unspecified: Secondary | ICD-10-CM

## 2019-03-29 DIAGNOSIS — K449 Diaphragmatic hernia without obstruction or gangrene: Secondary | ICD-10-CM

## 2019-03-29 DIAGNOSIS — M79671 Pain in right foot: Secondary | ICD-10-CM

## 2019-03-29 DIAGNOSIS — Z96641 Presence of right artificial hip joint: Secondary | ICD-10-CM

## 2019-03-29 NOTE — Progress Notes (Signed)
Virtual Visit Via telephone Note I connected with@ on 03/29/19 by telephone and verified that I am speaking with the correct person or authorized healthcare agent using two identifiers. Austin Graham is currently located at home and there are no unauthorized people in close proximity. I completed this visit while in a private location in my home .  This visit type was conducted due to national recommendations for restrictions regarding the COVID-19 Pandemic (e.g. social distancing).  This format is felt to be most appropriate for this patient at this time.  All issues noted in this document were discussed and addressed.  No physical exam was performed.    I discussed the limitations, risks, security and privacy concerns of performing an evaluation and management service by telephone and the availability of in person appointments. I also discussed with the patient that there may be a patient responsible charge related to this service. The patient expressed understanding and agreed to proceed.   Date:  03/29/2019    ID:  Austin Graham      10-12-1950        [id number]   Patient Care Team Patient Care Team: Chipper Herb, MD as PCP - General (Family Medicine)  Reason for Visit: Primary Care Follow-up     History of Present Illness & Review of Systems:     [name] is a 68 y.o. year old male primary care patient that presents today for a telehealth visit.  The patient is doing well overall and has had better control with his blood sugar since he has been taking the Riybelsus.  Today he denies any chest pain pressure tightness or shortness of breath.  He denies any trouble with passing his water including burning pain or frequency.  He does have a visit scheduled soon to see Dr. Lawerance Bach as his urologist has left town and his wife has been seeing Dr. Rosana Hoes.  He denies any trouble with nausea vomiting diarrhea blood in the stool or black tarry bowel movements.  He is due to get  a colonoscopy the first of 2021 and has spoken with Dr. Herb Grays in the past and will call him to make sure he gets this scheduled in early 2021.  His biggest complaint right now is right heel pain and we will try to get him into see 1 of the extremity specialist with [facility] as soon as possible as he is planning a trip to the beach.  The patient will himself work on the follow-up visits with Dr. Billie Lade Dr. Lawerance Bach and his wife will call in and arrange visits for them to see a PCP in the office in 3 to 4 months.  Review of systems as stated, otherwise negative.  The patient does not have symptoms concerning for COVID-19 infection (fever, chills, cough, or new shortness of breath).      Current Medications (Verified) Allergies as of 03/29/2019      Reactions   Codeine    REACTION: agitation   Erythromycin    REACTION: diarrhea   Latex    rash   Other    Melon- itchy throat      Medication List       Accurate as of March 29, 2019 10:21 AM. If you have any questions, ask your nurse or doctor.        aspirin 81 MG tablet Take by mouth.   atorvastatin 80 MG tablet Commonly known as: LIPITOR TAKE 1 TABLET (  80 MG TOTAL) BY MOUTH DAILY. AS DIRECTED   BENICAR PO Take 10 mg by mouth daily.   olmesartan 40 MG tablet Commonly known as: BENICAR TAKE 1 TABLET BY MOUTH EVERY DAY   Icosapent Ethyl 1 g Caps Commonly known as: Vascepa Take 2 capsules (2 g total) by mouth 2 (two) times daily.   Janumet XR 50-1000 MG Tb24 Generic drug: SitaGLIPtin-MetFORMIN HCl TAKE 1 TABLET BY MOUTH TWICE A DAY   Omega 3 1200 MG Caps Take 1,200 mg by mouth daily.   ONE TOUCH ULTRA TEST test strip Generic drug: glucose blood CHECK BLOOD SUGAR DAILY AND AS NEEDED. E11.9   Rybelsus 3 MG Tabs Generic drug: Semaglutide TAKE 1 TABLET BY MOUTH EVERY MORNING ON EMPTY STOMACH WITH 4OZ OF WATER THEN EAT 30 TO 60 MIN AFTER   Vitamin D 50 MCG (2000 UT) Caps Take 1 capsule by mouth daily.    Vitamins/Minerals Tabs Take by mouth.           Allergies (Verified)    Codeine, Erythromycin, Latex, and Other  Past Medical History Past Medical History:  Diagnosis Date  . Arthritis   . Asthma    as child  . Complication of anesthesia    hard to waken  . Diabetes ([place])    type II  . Diverticulosis   . Dysrhythmia    PAC's  . Goiter   . History of hiatal hernia   . Hyperlipidemia   . Hypertension   . Kidney stones   . Stress fracture    left knee, right hip  . Thyroid disease      Past Surgical History:  Procedure Laterality Date  . CYSTOSCOPY WITH INSERTION OF UROLIFT    . JOINT REPLACEMENT    . KIDNEY STONES  2006  . SKIN CANCER DESTRUCTION Left    deltoid- [facility]  . SKIN CANCER EXCISION Right 09/16/2018   shoulder   . TONSILLECTOMY    . 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   Procedure: RIGHT TOTAL HIP ARTHROPLASTY ANTERIOR APPROACH;  Surgeon: Gaynelle Arabian, MD;  Location: [facility];  Service: Orthopedics;  Laterality: Right;  . WISDOM TOOTH EXTRACTION      Social History   Socioeconomic History  . Marital status: Married    Spouse name: Not on file  . Number of children: 3  . Years of education: Not on file  . Highest education level: Not on file  Occupational History    Employer: [employer]  Social Needs  . Financial resource strain: Not on file  . Food insecurity    Worry: Not on file    Inability: Not on file  . Transportation needs    Medical: Not on file    Non-medical: Not on file  Tobacco Use  . Smoking status: Former Smoker    Quit date: 11/02/1974    Years since quitting: 44.4  . Smokeless tobacco: Never Used  Substance and Sexual Activity  . Alcohol use: Yes    Alcohol/week: 1.0 - 2.0 standard drinks    Types: 1 - 2 Standard drinks or equivalent per week    Comment: 2-3 PER WEEK  . Drug use: Yes    Types: Marijuana  . Sexual activity: Not on file  Lifestyle  . Physical activity    Days per week: Not on file     Minutes per session: Not on file  . Stress: Not on file  Relationships  . Social Herbalist on  phone: Not on file    Gets together: Not on file    Attends religious service: Not on file    Active member of club or organization: Not on file    Attends meetings of clubs or organizations: Not on file    Relationship status: Not on file  Other Topics Concern  . Not on file  Social History Narrative   Lives at home with wife.       Family History  Problem Relation Age of Onset  . Cancer Sister        ovarian  . Diabetes Sister   . Breast cancer Mother 47  . CAD Father 38       Died age 31 after CABG  . Stroke Father       Labs/Other Tests and Data Reviewed:    Wt Readings from Last 3 Encounters:  11/18/18 212 lb (96.2 kg)  07/14/18 211 lb (95.7 kg)  05/09/18 210 lb 2 oz (95.3 kg)   Temp Readings from Last 3 Encounters:  11/18/18 (!) 97.1 F (36.2 C) (Oral)  07/14/18 (!) 97.5 F (36.4 C) (Oral)  04/20/18 (!) 97.4 F (36.3 C) (Oral)   BP Readings from Last 3 Encounters:  11/18/18 114/64  07/14/18 (!) 142/75  05/09/18 118/68   Pulse Readings from Last 3 Encounters:  11/18/18 (!) 105  07/14/18 (!) 109  05/09/18 86     Lab Results  Component Value Date   HGBA1C 7.2 (H) 03/27/2019   HGBA1C 9.1 (H) 11/11/2018   HGBA1C 9.3 (H) 08/09/2018   Lab Results  Component Value Date   MICROALBUR 20 08/07/2015   LDLCALC 83 03/27/2019   CREATININE 1.15 03/27/2019       Chemistry      Component Value Date/Time   NA 140 03/27/2019 1146   K 4.9 03/27/2019 1146   CL 101 03/27/2019 1146   CO2 21 03/27/2019 1146   BUN 20 03/27/2019 1146   CREATININE 1.15 03/27/2019 1146   CREATININE 0.92 11/22/2013 1232      Component Value Date/Time   CALCIUM 10.0 03/27/2019 1146   ALKPHOS 89 03/27/2019 1146   AST 18 03/27/2019 1146   ALT 25 03/27/2019 1146   BILITOT 0.7 03/27/2019 1146         OBSERVATIONS/ OBJECTIVE:     The patient's blood sugars have been  running anywhere between 101 60 fasting.  His recent A1c was good at 7.2% and much improved from 4 months ago when it was 9.1%.  His blood pressures have been running in the 1 20-1 30 range over the 70-90 range.  Other than right heel pain his feet are doing well with no sign of infection redness or drainage.  He also is planning to schedule his eye exam as this needs to be done.  Physical exam deferred due to nature of telephonic visit.  ASSESSMENT & PLAN    Time:   Today, I have spent 38 minutes with the patient via telephone discussing the above including Covid precautions.     Visit Diagnoses: 1. Essential hypertension -Continue current treatment but try to do better with sodium restriction and keeping blood pressures in the 120-130 range in the 70 range for the diastolic.  2. Gastroesophageal reflux disease with hiatal hernia -No complaints today with reflux.  3. Degenerative disc disease, lumbar -Only complaint today is with his right heel.  4. Nephrolithiasis -Follow-up with urology as planned  5. Benign prostatic hyperplasia without lower urinary tract symptoms -  Follow-up with urology as planned  6. Mixed hyperlipidemia -Continue with atorvastatin.  The patient is cutting the pill into and I told him that in the future for compliance purposes he may have to go to the exact strength that he is taking instead of cutting it into and he understands this.  7. Status post right hip replacement -Arrange visit with orthopedist to look at right heel pain  8.  Family history heart disease -Make sure patient has periodic visits with cardiology -The patient is really not having any problems with chest pain but he does have a family history of heart disease and we want him to have a cardiac visit for a good evaluation at some point in the fall.  9.  Right heel pain -Schedule visit with Dr. Doran Durand or his partner to further evaluate this as soon as possible  Patient Instructions   Continue to watch diet closely exercise regularly and drink plenty of water and fluids Continue to have periodic visits with cardiology because of family history of heart disease Schedule visit with orthopedic surgeon to further evaluate right heel pain and possible injection may be needed. He will schedule visit with gastroenterology for routine colonoscopy to be done early in 2021, he will schedule visit for eye exam to be done as soon as possible, he will schedule visit with surgeon to follow-up on goiter and neck and this is with Dr. Dalbert Batman as soon as possible and he will follow-up with his urologist, Dr. Rosana Hoes because of kidney stones.    The above assessment and management plan was discussed with the patient. The patient verbalized understanding of and has agreed to the management plan. Patient is aware to call the clinic if symptoms persist or worsen. Patient is aware when to return to the clinic for a follow-up visit. Patient educated on when it is appropriate to go to the emergency department.    Chipper Herb, MD [address], [place], [place] [address] Ph [phone]   Arrie Senate MD

## 2019-03-29 NOTE — Patient Instructions (Addendum)
Continue to watch diet closely exercise regularly and drink plenty of water and fluids Continue to have periodic visits with cardiology because of family history of heart disease Schedule visit with orthopedic surgeon to further evaluate right heel pain and possible injection may be needed. He will schedule visit with gastroenterology for routine colonoscopy to be done early in 2021, he will schedule visit for eye exam to be done as soon as possible, he will schedule visit with surgeon to follow-up on goiter and neck and this is with Dr. Dalbert Batman as soon as possible and he will follow-up with his urologist, Dr. Rosana Hoes because of kidney stones.

## 2019-04-03 ENCOUNTER — Telehealth: Payer: Self-pay | Admitting: Family Medicine

## 2019-04-03 NOTE — Telephone Encounter (Signed)
Patient aware.

## 2019-04-03 NOTE — Telephone Encounter (Signed)
The likelihood is that he is in the donut hole, usually he would have to pay this for a month or 2 for the donut hole and that it would go away but this should be something that he could call his insurance company for.  If he is in the donut hole he can apply for prescription assistance to help the donut hole go away. [employer] [address] [place], [place] [address] [phone]

## 2019-04-04 ENCOUNTER — Telehealth: Payer: Self-pay | Admitting: Family Medicine

## 2019-04-04 MED ORDER — JANUMET XR 50-1000 MG PO TB24: 1.0000 | ORAL_TABLET | Freq: Two times a day (BID) | ORAL | 1 refills | Status: DC

## 2019-04-04 NOTE — Telephone Encounter (Signed)
Completed in another encounter.

## 2019-04-04 NOTE — Telephone Encounter (Signed)
Rx changed from 30 day to 90 day.

## 2019-04-13 ENCOUNTER — Telehealth: Payer: Self-pay | Admitting: Family Medicine

## 2019-04-13 ENCOUNTER — Other Ambulatory Visit: Payer: Self-pay | Admitting: *Deleted

## 2019-04-13 MED ORDER — RYBELSUS 3 MG PO TABS: 3.0000 mg | ORAL_TABLET | Freq: Every day | ORAL | 2 refills | Status: DC

## 2019-04-13 NOTE — Telephone Encounter (Signed)
Patient has a follow up appointment scheduled. Script sent in.

## 2019-04-13 NOTE — Telephone Encounter (Signed)
Yes go ahead and send a refill of them and schedule an appointment for him and give him enough to get through the appointment

## 2019-05-26 ENCOUNTER — Ambulatory Visit: Payer: BC Managed Care – PPO | Admitting: Family Medicine

## 2019-06-15 ENCOUNTER — Ambulatory Visit: Payer: BLUE CROSS/BLUE SHIELD | Admitting: Cardiology

## 2019-07-06 ENCOUNTER — Other Ambulatory Visit: Payer: Self-pay

## 2019-07-07 ENCOUNTER — Encounter: Payer: Self-pay | Admitting: Family Medicine

## 2019-07-07 ENCOUNTER — Ambulatory Visit: Payer: BC Managed Care – PPO | Admitting: Family Medicine

## 2019-07-07 VITALS — BP 115/72 | HR 99 | Temp 98.7°F | Ht 73.0 in | Wt 203.2 lb

## 2019-07-07 DIAGNOSIS — I10 Essential (primary) hypertension: Secondary | ICD-10-CM

## 2019-07-07 DIAGNOSIS — E782 Mixed hyperlipidemia: Secondary | ICD-10-CM

## 2019-07-07 DIAGNOSIS — E1169 Type 2 diabetes mellitus with other specified complication: Secondary | ICD-10-CM | POA: Diagnosis not present

## 2019-07-07 DIAGNOSIS — Z23 Encounter for immunization: Secondary | ICD-10-CM

## 2019-07-07 DIAGNOSIS — K449 Diaphragmatic hernia without obstruction or gangrene: Secondary | ICD-10-CM

## 2019-07-07 DIAGNOSIS — K219 Gastro-esophageal reflux disease without esophagitis: Secondary | ICD-10-CM

## 2019-07-07 LAB — BAYER DCA HB A1C WAIVED: HB A1C (BAYER DCA - WAIVED): 7.5 % — ABNORMAL HIGH (ref <7.0)

## 2019-07-07 MED ORDER — GLUCOSE BLOOD VI STRP: 1.0000 | ORAL_STRIP | Freq: Two times a day (BID) | 5 refills | Status: DC

## 2019-07-07 MED ORDER — OLMESARTAN MEDOXOMIL 20 MG PO TABS: 10.0000 mg | ORAL_TABLET | Freq: Every day | ORAL | 3 refills | Status: DC

## 2019-07-07 MED ORDER — JANUMET XR 50-1000 MG PO TB24: 1.0000 | ORAL_TABLET | Freq: Two times a day (BID) | ORAL | 3 refills | Status: DC

## 2019-07-07 MED ORDER — ATORVASTATIN CALCIUM 80 MG PO TABS: 40.0000 mg | ORAL_TABLET | Freq: Every day | ORAL | 3 refills | Status: DC

## 2019-07-07 NOTE — Addendum Note (Signed)
Addended by: Karle Plumber on: 07/07/2019 02:31 PM   Modules accepted: Orders

## 2019-07-07 NOTE — Progress Notes (Signed)
BP 115/72   Pulse 99   Temp 98.7 F (37.1 C) (Temporal)   Ht '6\' 1"'  (1.854 m)   Wt 203 lb 3.2 oz (92.2 kg)   SpO2 98%   BMI 26.81 kg/m    Subjective:   Patient ID: Austin Graham, male    DOB: 1951-07-14, 68 y.o.   MRN: [id number]  HPI: Austin Graham is a 68 y.o. male presenting on 07/07/2019 for Establish Care ([facility])   HPI Type 2 diabetes mellitus Patient comes in today for recheck of his diabetes. Patient has been currently taking Janumet. Patient is currently on an ACE inhibitor/ARB. Patient has not seen an ophthalmologist this year. Patient denies any issues with their feet.   Hypertension Patient is currently on Benicar, and their blood pressure today is 115/72. Patient denies any lightheadedness or dizziness. Patient denies headaches, blurred vision, chest pains, shortness of breath, or weakness. Denies any side effects from medication and is content with current medication.   Hyperlipidemia Patient is coming in for recheck of his hyperlipidemia. The patient is currently taking Vascepa. They deny any issues with myalgias or history of liver damage from it. They deny any focal numbness or weakness or chest pain.   Relevant past medical, surgical, family and social history reviewed and updated as indicated. Interim medical history since our last visit reviewed. Allergies and medications reviewed and updated.  Review of Systems  Constitutional: Negative for chills and fever.  Respiratory: Negative for shortness of breath and wheezing.   Cardiovascular: Negative for chest pain and leg swelling.  Musculoskeletal: Negative for back pain and gait problem.  Skin: Negative for rash.  Neurological: Negative for dizziness, weakness, light-headedness and numbness.  All other systems reviewed and are negative.   Per HPI unless specifically indicated above   Allergies as of 07/07/2019      Reactions   Codeine    REACTION: agitation   Erythromycin    REACTION: diarrhea   Latex    rash   Other    Melon- itchy throat      Medication List       Accurate as of July 07, 2019  2:23 PM. If you have any questions, ask your nurse or doctor.        STOP taking these medications   Omega 3 1200 MG Caps Stopped by: Worthy Rancher, MD   Rybelsus 3 MG Tabs Generic drug: Semaglutide Stopped by: Fransisca Kaufmann Dettinger, MD     TAKE these medications   aspirin 81 MG tablet Take by mouth.   atorvastatin 80 MG tablet Commonly known as: LIPITOR Take 0.5 tablets (40 mg total) by mouth daily at 6 PM. What changed: See the new instructions. Changed by: Fransisca Kaufmann Dettinger, MD   Icosapent Ethyl 1 g Caps Commonly known as: Vascepa Take 2 capsules (2 g total) by mouth 2 (two) times daily.   Janumet XR 50-1000 MG Tb24 Generic drug: SitaGLIPtin-MetFORMIN HCl Take 1 tablet by mouth 2 (two) times daily.   olmesartan 20 MG tablet Commonly known as: BENICAR Take 0.5 tablets (10 mg total) by mouth daily. What changed:   medication strength  how much to take  Another medication with the same name was removed. Continue taking this medication, and follow the directions you see here. Changed by: Fransisca Kaufmann Dettinger, MD   ONE TOUCH ULTRA TEST test strip Generic drug: glucose blood CHECK BLOOD SUGAR DAILY AND AS NEEDED. E11.9   Vitamin D 50 MCG (2000 UT) Caps Take  1 capsule by mouth daily.   Vitamins/Minerals Tabs Take by mouth.        Objective:   BP 115/72   Pulse 99   Temp 98.7 F (37.1 C) (Temporal)   Ht '6\' 1"'  (1.854 m)   Wt 203 lb 3.2 oz (92.2 kg)   SpO2 98%   BMI 26.81 kg/m   Wt Readings from Last 3 Encounters:  07/07/19 203 lb 3.2 oz (92.2 kg)  11/18/18 212 lb (96.2 kg)  07/14/18 211 lb (95.7 kg)    Physical Exam Vitals signs and nursing note reviewed.  Constitutional:      General: He is not in acute distress.    Appearance: He is well-developed. He is not diaphoretic.  Eyes:     General: No scleral icterus.     Conjunctiva/sclera: Conjunctivae normal.  Neck:     Musculoskeletal: Neck supple.     Thyroid: No thyromegaly.  Cardiovascular:     Rate and Rhythm: Normal rate and regular rhythm.     Heart sounds: Normal heart sounds. No murmur.  Pulmonary:     Effort: Pulmonary effort is normal. No respiratory distress.     Breath sounds: Normal breath sounds. No wheezing.  Musculoskeletal: Normal range of motion.  Lymphadenopathy:     Cervical: No cervical adenopathy.  Skin:    General: Skin is warm and dry.     Findings: No rash.  Neurological:     Mental Status: He is alert and oriented to person, place, and time.     Coordination: Coordination normal.  Psychiatric:        Behavior: Behavior normal.       Assessment & Plan:   Problem List Items Addressed This Visit      Cardiovascular and Mediastinum   Hypertension   Relevant Medications   atorvastatin (LIPITOR) 80 MG tablet   olmesartan (BENICAR) 20 MG tablet   Other Relevant Orders   BMP8+EGFR     Respiratory   Gastroesophageal reflux disease with hiatal hernia     Endocrine   Diabetes, poorly controlled - Primary   Relevant Medications   atorvastatin (LIPITOR) 80 MG tablet   olmesartan (BENICAR) 20 MG tablet   SitaGLIPtin-MetFORMIN HCl (JANUMET XR) 50-1000 MG TB24   Other Relevant Orders   Bayer DCA Hb A1c Waived   BMP8+EGFR     Other   Hyperlipidemia   Relevant Medications   atorvastatin (LIPITOR) 80 MG tablet   olmesartan (BENICAR) 20 MG tablet      Benicar and atorvastatin and Janumet, continue, will check A1c and see where he is Follow up plan: Return in about 3 months (around 10/07/2019), or if symptoms worsen or fail to improve, for Type 2 diabetes recheck.  Counseling provided for all of the vaccine components Orders Placed This Encounter  Procedures  . Bayer DCA Hb A1c Waived  . BMP8+EGFR    Caryl Pina, MD [facility] 07/07/2019, 2:23 PM

## 2019-07-08 LAB — BMP8+EGFR
BUN/Creatinine Ratio: 20 (ref 10–24)
BUN: 21 mg/dL (ref 8–27)
CO2: 21 mmol/L (ref 20–29)
Calcium: 9.7 mg/dL (ref 8.6–10.2)
Chloride: 103 mmol/L (ref 96–106)
Creatinine, Ser: 1.06 mg/dL (ref 0.76–1.27)
GFR calc Af Amer: 84 mL/min/{1.73_m2} (ref >59)
GFR calc non Af Amer: 73 mL/min/{1.73_m2} (ref >59)
Glucose: 136 mg/dL — ABNORMAL HIGH (ref 65–99)
Potassium: 4.3 mmol/L (ref 3.5–5.2)
Sodium: 140 mmol/L (ref 134–144)

## 2019-08-02 DIAGNOSIS — E109 Type 1 diabetes mellitus without complications: Secondary | ICD-10-CM | POA: Insufficient documentation

## 2019-08-02 DIAGNOSIS — R072 Precordial pain: Secondary | ICD-10-CM | POA: Insufficient documentation

## 2019-08-02 NOTE — Progress Notes (Signed)
Cardiology Office Note   Date:  08/03/2019   ID:  Latonio Filosa, DOB Feb 06, 1951, MRN [id number]  PCP:  Dettinger, Fransisca Kaufmann, MD  Cardiologist:   Minus Breeding, MD    Chief Complaint  Patient presents with  . First Degree Relative with Early CAD      History of Present Illness: Austin Graham is a 68 y.o. male who presents for evaluation of chest discomfort. He was seen in 2009 for chest pain and had a treadmill test and I saw him a little bit more than 3 years ago when he had chest discomfort again. Stress testing was negative.  He returns for follow up.  He had a negative POET (Plain Old Exercise Treadmill) when I last saw him in 2017.    Since I last saw him he is done relatively well.  He comes in today and he has some right foot pain possibly from overuse.  He has some inflammation of the dorsum of his foot.  He is not having any new cardiovascular complaints.  Unfortunately his blood sugars not been well controlled recently although it was 9.1 and his A1c is now 7.5.  His blood pressure has been okay.  Lipids are slightly elevated as below.  He has not been exercising as much because of a heel spur and now this new pain in his right foot.  He did a lot of activity recently with some fishing tournaments.  The patient denies any new symptoms such as chest discomfort, neck or arm discomfort. There has been no new shortness of breath, PND or orthopnea. There have been no reported palpitations, presyncope or syncope.    Past Medical History:  Diagnosis Date  . Arthritis   . Asthma    as child  . Complication of anesthesia    hard to waken  . Diabetes ([place])    type II  . Diverticulosis   . Dysrhythmia    PAC's  . Goiter   . History of hiatal hernia   . Hyperlipidemia   . Hypertension   . Kidney stones   . Stress fracture    left knee, right hip  . Thyroid disease     Past Surgical History:  Procedure Laterality Date  . CYSTOSCOPY WITH INSERTION OF UROLIFT    .  JOINT REPLACEMENT    . KIDNEY STONES  2006  . SKIN CANCER DESTRUCTION Left    deltoid- BCC  . SKIN CANCER EXCISION Right 09/16/2018   shoulder   . TONSILLECTOMY    . 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   Procedure: RIGHT TOTAL HIP ARTHROPLASTY ANTERIOR APPROACH;  Surgeon: Gaynelle Arabian, MD;  Location: [facility];  Service: Orthopedics;  Laterality: Right;  . WISDOM TOOTH EXTRACTION       Current Outpatient Medications  Medication Sig Dispense Refill  . aspirin 81 MG tablet Take by mouth.    Marland Kitchen atorvastatin (LIPITOR) 80 MG tablet Take 0.5 tablets (40 mg total) by mouth daily at 6 PM. 45 tablet 3  . Cholecalciferol (VITAMIN D) 2000 units CAPS Take 1 capsule by mouth daily.    Marland Kitchen glucose blood test strip 1 each by Other route 2 (two) times daily. One touch 100 each 5  . Icosapent Ethyl (VASCEPA) 1 g CAPS Take 2 capsules (2 g total) by mouth 2 (two) times daily. 360 capsule 3  . olmesartan (BENICAR) 20 MG tablet Take 0.5 tablets (10 mg total) by mouth daily. 45 tablet 3  . ONE TOUCH ULTRA  TEST test strip CHECK BLOOD SUGAR DAILY AND AS NEEDED. E11.9 100 each 3  . SitaGLIPtin-MetFORMIN HCl (JANUMET XR) 50-1000 MG TB24 Take 1 tablet by mouth 2 (two) times daily. 180 tablet 3  . Vitamins/Minerals TABS Take by mouth.    . metoprolol tartrate (LOPRESSOR) 50 MG tablet Take 2 hours prior to procedure 1 tablet 0   No current facility-administered medications for this visit.     Allergies:   Codeine, Erythromycin, Latex, and Other    ROS:  Please see the history of present illness.   Otherwise, review of systems are positive for none.   All other systems are reviewed and negative.    PHYSICAL EXAM: VS:  BP (!) 139/93   Pulse 85   Temp (!) 97 F (36.1 C)   Ht 6' (1.829 m)   Wt 209 lb (94.8 kg)   SpO2 98%   BMI 28.35 kg/m  , BMI Body mass index is 28.35 kg/m. GENERAL:  Well appearing HEENT:  Pupils equal round and reactive, fundi not visualized, oral mucosa unremarkable NECK:  No jugular  venous distention, waveform within normal limits, carotid upstroke brisk and symmetric, no bruits, no thyromegaly LYMPHATICS:  No cervical, inguinal adenopathy LUNGS:  Clear to auscultation bilaterally BACK:  No CVA tenderness CHEST:  Unremarkable HEART:  PMI not displaced or sustained,S1 and S2 within normal limits, no S3, no S4, no clicks, no rubs, no murmurs ABD:  Flat, positive bowel sounds normal in frequency in pitch, no bruits, no rebound, no guarding, no midline pulsatile mass, no hepatomegaly, no splenomegaly EXT:  2 plus pulses throughout, no edema, no cyanosis no clubbing, swelling slightly on the dorsum of the right foot SKIN:  No rashes no nodules NEURO:  Cranial nerves II through XII grossly intact, motor grossly intact throughout PSYCH:  Cognitively intact, oriented to person place and time   EKG:  EKG is  ordered today. The ekg ordered today demonstrates sinus rhythm, rate 76, axis within normal limits, intervals within normal limits, no acute ST-T wave changes.   Recent Labs: 03/27/2019: ALT 25; Hemoglobin 14.6; Platelets 263 07/07/2019: BUN 21; Creatinine, Ser 1.06; Potassium 4.3; Sodium 140    Lipid Panel    Component Value Date/Time   CHOL 150 03/27/2019 1146   CHOL 137 11/22/2013 1232   TRIG 155 (H) 03/27/2019 1146   TRIG 161 (H) 08/18/2016 1142   TRIG 192 (H) 11/22/2013 1232   HDL 36 (L) 03/27/2019 1146   HDL 38 (L) 08/18/2016 1142   HDL 33 (L) 11/22/2013 1232   CHOLHDL 4.2 03/27/2019 1146   LDLCALC 83 03/27/2019 1146   LDLCALC 86 07/04/2014 0955   LDLCALC 66 11/22/2013 1232      Wt Readings from Last 3 Encounters:  08/03/19 209 lb (94.8 kg)  07/07/19 203 lb 3.2 oz (92.2 kg)  11/18/18 212 lb (96.2 kg)      Other studies Reviewed: Additional studies/ records that were reviewed today include: Labs Review of the above records demonstrates:  Please see elsewhere in the note.     ASSESSMENT AND PLAN:  FIRST DEGREE RELATIVE WITH CAD: Given his risk  factors particularly his family history I like to order a coronary calcium score.  This will allow [place] to better determine goals of therapy.  DM:   His A1c down to 7.5.  He understands importance of good control.   HTN:  The blood pressure is borderline but not usually elevated.  I will make no change to his  medical regimen.   DYSLIPIDEMIA: The patient's LDL was 83 and HDL 36.  Goals of therapy will be based on the results of coronary calcium score.  ELEVATED EPWORTH SCALE: He does have an elevated Epworth scale but he does not really think he has sleep apnea and we will pursue weight loss.   Current medicines are reviewed at length with the patient today.  The patient does not have concerns regarding medicines.  The following changes have been made:  None Labs/ tests ordered today include:   Orders Placed This Encounter  Procedures  . CT CORONARY MORPH W/CTA COR W/SCORE W/CA W/CM &/OR WO/CM  . CT CORONARY FRACTIONAL FLOW RESERVE DATA PREP  . CT CORONARY FRACTIONAL FLOW RESERVE FLUID ANALYSIS  . Basic metabolic panel  . EKG 12-Lead     Disposition:   FU with me as needed based on the above.     Signed, Minus Breeding, MD  08/03/2019 12:18 PM    [facility]

## 2019-08-03 ENCOUNTER — Encounter: Payer: Self-pay | Admitting: Cardiology

## 2019-08-03 ENCOUNTER — Other Ambulatory Visit: Payer: Self-pay

## 2019-08-03 ENCOUNTER — Ambulatory Visit (INDEPENDENT_AMBULATORY_CARE_PROVIDER_SITE_OTHER): Payer: BC Managed Care – PPO | Admitting: Cardiology

## 2019-08-03 VITALS — BP 139/93 | HR 85 | Temp 97.0°F | Ht 72.0 in | Wt 209.0 lb

## 2019-08-03 DIAGNOSIS — E109 Type 1 diabetes mellitus without complications: Secondary | ICD-10-CM | POA: Diagnosis not present

## 2019-08-03 DIAGNOSIS — R072 Precordial pain: Secondary | ICD-10-CM | POA: Diagnosis not present

## 2019-08-03 DIAGNOSIS — Z8249 Family history of ischemic heart disease and other diseases of the circulatory system: Secondary | ICD-10-CM

## 2019-08-03 DIAGNOSIS — I10 Essential (primary) hypertension: Secondary | ICD-10-CM

## 2019-08-03 DIAGNOSIS — Z01810 Encounter for preprocedural cardiovascular examination: Secondary | ICD-10-CM

## 2019-08-03 MED ORDER — METOPROLOL TARTRATE 50 MG PO TABS: ORAL_TABLET | ORAL | 0 refills | Status: DC

## 2019-08-03 NOTE — Patient Instructions (Addendum)
Medication Instructions:  Your physician recommends that you continue on your current medications as directed. Please refer to the Current Medication list given to you today.  *If you need a refill on your cardiac medications before your next appointment, please call your pharmacy*  Lab Work: You will need to have labs (blood work) drawn 1 week prior to Coronary CT:  BMET If you have labs (blood work) drawn today and your tests are completely normal, you will receive your results only by: Marland Kitchen MyChart Message (if you have MyChart) OR . A paper copy in the mail If you have any lab test that is abnormal or we need to change your treatment, we will call you to review the results.  Testing/Procedures: Your physician has requested that you have cardiac CT. Cardiac computed tomography (CT) is a painless test that uses an x-ray machine to take clear, detailed pictures of your heart. For further information please visit [email or web address]. Please follow instruction sheet as given.   Follow-Up: At [facility], you and your health needs are our priority.  As part of our continuing mission to provide you with exceptional heart care, we have created designated Provider Care Teams.  These Care Teams include your primary Cardiologist (physician) and Advanced Practice Providers (APPs -  Physician Assistants and Nurse Practitioners) who all work together to provide you with the care you need, when you need it.  Your next appointment:   4 weeks   The format for your next appointment:   In Person  Provider:   Minus Breeding, MD  Other Instructions   Your cardiac CT will be scheduled at one of the below locations:   [facility] [address] [place], [place] [address] [phone]  [place] [address] [place], [place] [address] [phone]  If scheduled at [facility], please arrive at the [facility] main  entrance of [facility] 30-45 minutes prior to test start time. Proceed to the [facility] Radiology Department (first floor) to check-in and test prep.  If scheduled at [facility], please arrive 15 mins early for check-in and test prep.  Please follow these instructions carefully (unless otherwise directed):  Hold all erectile dysfunction medications at least 3 days (72 hrs) prior to test.  On the Night Before the Test: . Be sure to Drink plenty of water. . Do not consume any caffeinated/decaffeinated beverages or chocolate 12 hours prior to your test. . Do not take any antihistamines 12 hours prior to your test. . If the patient has contrast allergy: ? Patient will need a prescription for Prednisone and very clear instructions (as follows): 1. Prednisone 50 mg - take 13 hours prior to test 2. Take another Prednisone 50 mg 7 hours prior to test 3. Take another Prednisone 50 mg 1 hour prior to test 4. Take Benadryl 50 mg 1 hour prior to test . Patient must complete all four doses of above prophylactic medications. . Patient will need a ride after test due to Benadryl.  On the Day of the Test: . Drink plenty of water. Do not drink any water within one hour of the test. . Do not eat any food 4 hours prior to the test. . You may take your regular medications prior to the test.  . Take metoprolol (Lopressor) two hours prior to test. . HOLD Furosemide/Hydrochlorothiazide morning of the test. . FEMALES- please wear underwire-free bra if available   *  For Clinical Staff only. Please instruct patient the following:*        -Drink plenty of water       -Hold Furosemide/hydrochlorothiazide morning of the test       -Take metoprolol (Lopressor) 2 hours prior to test (if applicable).                  -If HR is less than 55 BPM- No Beta Blocker                -IF HR is greater than 55 BPM and patient is less than or equal to 89 yrs old Lopressor 100mg  x1.                 -If HR is greater than 55 BPM and patient is greater than 12 yrs old Lopressor 50 mg x1.     Do not give Lopressor to patients with an allergy to lopressor or anyone with asthma or active COPD symptoms (currently taking steroids).       After the Test: . Drink plenty of water. . After receiving IV contrast, you may experience a mild flushed feeling. This is normal. . On occasion, you may experience a mild rash up to 24 hours after the test. This is not dangerous. If this occurs, you can take Benadryl 25 mg and increase your fluid intake. . If you experience trouble breathing, this can be serious. If it is severe call 911 IMMEDIATELY. If it is mild, please call our office. . If you take any of these medications: Glipizide/Metformin, Avandament, Glucavance, please do not take 48 hours after completing test unless otherwise instructed.   Once we have confirmed authorization from your insurance company, we will call you to set up a date and time for your test.   For non-scheduling related questions, please contact the cardiac imaging nurse navigator should you have any questions/concerns: Marchia Bond, RN Navigator Cardiac Imaging [facility] [phone] Office

## 2019-08-11 ENCOUNTER — Other Ambulatory Visit: Payer: Self-pay

## 2019-08-11 DIAGNOSIS — Z20822 Contact with and (suspected) exposure to covid-19: Secondary | ICD-10-CM

## 2019-08-11 LAB — HM DIABETES EYE EXAM

## 2019-08-13 LAB — NOVEL CORONAVIRUS, NAA: SARS-CoV-2, NAA: NOT DETECTED

## 2019-08-14 ENCOUNTER — Telehealth: Payer: Self-pay | Admitting: Family Medicine

## 2019-08-14 NOTE — Telephone Encounter (Incomplete)
Negative COVID results given. Patient results "NOT Detected." Caller expressed understanding. ° °

## 2019-08-22 ENCOUNTER — Other Ambulatory Visit: Payer: Self-pay

## 2019-08-22 DIAGNOSIS — Z20822 Contact with and (suspected) exposure to covid-19: Secondary | ICD-10-CM

## 2019-08-23 LAB — NOVEL CORONAVIRUS, NAA: SARS-CoV-2, NAA: NOT DETECTED

## 2019-09-15 ENCOUNTER — Ambulatory Visit: Payer: BC Managed Care – PPO | Admitting: Cardiology

## 2019-09-27 ENCOUNTER — Telehealth (HOSPITAL_COMMUNITY): Payer: Self-pay | Admitting: Emergency Medicine

## 2019-09-27 LAB — BASIC METABOLIC PANEL
BUN/Creatinine Ratio: 20 (ref 10–24)
BUN: 23 mg/dL (ref 8–27)
CO2: 22 mmol/L (ref 20–29)
Calcium: 10.2 mg/dL (ref 8.6–10.2)
Chloride: 100 mmol/L (ref 96–106)
Creatinine, Ser: 1.17 mg/dL (ref 0.76–1.27)
GFR calc Af Amer: 75 mL/min/{1.73_m2} (ref >59)
GFR calc non Af Amer: 65 mL/min/{1.73_m2} (ref >59)
Glucose: 411 mg/dL — ABNORMAL HIGH (ref 65–99)
Potassium: 4.6 mmol/L (ref 3.5–5.2)
Sodium: 139 mmol/L (ref 134–144)

## 2019-09-27 NOTE — Telephone Encounter (Signed)
Reaching out to patient to offer assistance regarding upcoming cardiac imaging study; pt verbalizes understanding of appt date/time, parking situation and where to check in, pre-test NPO status and medications ordered, and verified current allergies; name and call back number provided for further questions should they arise Krisalyn Yankowski RN Navigator Cardiac Imaging [facility] [phone] office [phone] cell 

## 2019-09-28 ENCOUNTER — Ambulatory Visit (HOSPITAL_COMMUNITY)
Admission: RE | Admit: 2019-09-28 | Discharge: 2019-09-28 | Disposition: A | Discharge status: Home / Self Care | Payer: BC Managed Care – PPO | Source: Ambulatory Visit | Attending: Cardiology | Admitting: Cardiology

## 2019-09-28 ENCOUNTER — Other Ambulatory Visit: Payer: Self-pay

## 2019-09-28 ENCOUNTER — Ambulatory Visit
Admission: RE | Admit: 2019-09-28 | Discharge: 2019-09-28 | Disposition: A | Discharge status: Home / Self Care | Payer: Self-pay | Source: Ambulatory Visit | Attending: Emergency Medicine | Admitting: Emergency Medicine

## 2019-09-28 ENCOUNTER — Other Ambulatory Visit (HOSPITAL_COMMUNITY): Payer: Self-pay | Admitting: Emergency Medicine

## 2019-09-28 DIAGNOSIS — I2510 Atherosclerotic heart disease of native coronary artery without angina pectoris: Secondary | ICD-10-CM

## 2019-09-28 DIAGNOSIS — Z8249 Family history of ischemic heart disease and other diseases of the circulatory system: Secondary | ICD-10-CM

## 2019-09-28 DIAGNOSIS — R079 Chest pain, unspecified: Secondary | ICD-10-CM

## 2019-09-28 MED ORDER — NITROGLYCERIN 0.4 MG SL SUBL
SUBLINGUAL_TABLET | SUBLINGUAL | Status: AC
  Filled 2019-09-28: qty 2

## 2019-09-28 MED ORDER — METOPROLOL TARTRATE 5 MG/5ML IV SOLN
5.0000 mg | Freq: Once | INTRAVENOUS | Status: AC
  Administered 2019-09-28: 5 mg via INTRAVENOUS

## 2019-09-28 MED ORDER — NITROGLYCERIN 0.4 MG SL SUBL: 0.8000 mg | SUBLINGUAL_TABLET | Freq: Once | SUBLINGUAL | Status: DC

## 2019-09-28 MED ORDER — IOHEXOL 350 MG/ML SOLN
80.0000 mL | Freq: Once | INTRAVENOUS | Status: AC | PRN
  Administered 2019-09-28: 80 mL via INTRAVENOUS

## 2019-09-28 MED ORDER — DILTIAZEM HCL 25 MG/5ML IV SOLN
INTRAVENOUS | Status: AC
  Filled 2019-09-28: qty 5

## 2019-09-28 MED ORDER — METOPROLOL TARTRATE 5 MG/5ML IV SOLN
INTRAVENOUS | Status: AC
  Filled 2019-09-28: qty 5

## 2019-09-28 MED ORDER — METOPROLOL TARTRATE 5 MG/5ML IV SOLN: 5.0000 mg | Freq: Once | INTRAVENOUS | Status: DC

## 2019-09-29 DIAGNOSIS — I2510 Atherosclerotic heart disease of native coronary artery without angina pectoris: Secondary | ICD-10-CM | POA: Diagnosis not present

## 2019-10-05 NOTE — Progress Notes (Signed)
Cardiology Office Note   Date:  10/06/2019   ID:  Austin Graham, DOB September 12, 1951, MRN [id number]  PCP:  Dettinger, Fransisca Kaufmann, MD  Cardiologist:   Minus Breeding, MD    Chief Complaint  Patient presents with  . Coronary Artery Disease      History of Present Illness: Austin Graham is a 69 y.o. male who presents for evaluation of chest discomfort. He was seen in 2009 for chest pain and had a treadmill test and I saw him a little bit more than 3 years ago when he had chest discomfort again. Stress testing was negative.  He returns for follow up.  He had a negative POET (Plain Old Exercise Treadmill) when I last saw him in 2017.    Because of shortness of breath and his cardiovascular risk factors I sent her out coronary CTA which demonstrated 25 to 49% right stenosis, 25 to 49% ramus intermedius stenosis and 50 to 70% LAD stenosis.  He has mid and distal LAD FFR were abnormal suggesting flow-limiting stenosis.  His coronary calcium score was 737 which was 83rd percentile.  He has not had any symptoms though he has been limited by some foot problems.  However, with activities he denies any chest pressure, neck or arm discomfort.  He has not had any new shortness of breath, PND or orthopnea.  He has had no new palpitations, presyncope or syncope.  Past Medical History:  Diagnosis Date  . Arthritis   . Asthma    as child  . Complication of anesthesia    hard to waken  . Diabetes ([place])    type II  . Diverticulosis   . Dysrhythmia    PAC's  . Goiter   . History of hiatal hernia   . Hyperlipidemia   . Hypertension   . Kidney stones   . Stress fracture    left knee, right hip  . Thyroid disease     Past Surgical History:  Procedure Laterality Date  . CYSTOSCOPY WITH INSERTION OF UROLIFT    . JOINT REPLACEMENT    . KIDNEY STONES  2006  . SKIN CANCER DESTRUCTION Left    deltoid- BCC  . SKIN CANCER EXCISION Right 09/16/2018   shoulder   . TONSILLECTOMY    . TOTAL HIP  ARTHROPLASTY Right 06/12/2015   Procedure: RIGHT TOTAL HIP ARTHROPLASTY ANTERIOR APPROACH;  Surgeon: Gaynelle Arabian, MD;  Location: [facility];  Service: Orthopedics;  Laterality: Right;  . WISDOM TOOTH EXTRACTION       Current Outpatient Medications  Medication Sig Dispense Refill  . aspirin 81 MG tablet Take by mouth daily.     . Cholecalciferol (VITAMIN D) 2000 units CAPS Take 1 capsule by mouth daily.    Marland Kitchen glucose blood test strip 1 each by Other route 2 (two) times daily. One touch 100 each 5  . Icosapent Ethyl (VASCEPA) 1 g CAPS Take 2 capsules (2 g total) by mouth 2 (two) times daily. 360 capsule 3  . metoprolol tartrate (LOPRESSOR) 50 MG tablet Take 2 hours prior to procedure 1 tablet 0  . olmesartan (BENICAR) 20 MG tablet Take 0.5 tablets (10 mg total) by mouth daily. 45 tablet 3  . ONE TOUCH ULTRA TEST test strip CHECK BLOOD SUGAR DAILY AND AS NEEDED. E11.9 100 each 3  . Semaglutide (RYBELSUS) 3 MG TABS Take by mouth.    . SitaGLIPtin-MetFORMIN HCl (JANUMET XR) 50-1000 MG TB24 Take 1 tablet by mouth 2 (two) times daily. [place]  tablet 3  . Vitamins/Minerals TABS Take by mouth.    . rosuvastatin (CRESTOR) 40 MG tablet Take 1 tablet (40 mg total) by mouth daily. 90 tablet 3   No current facility-administered medications for this visit.    Allergies:   Codeine, Erythromycin, Latex, and Other    ROS:  Please see the history of present illness.   Otherwise, review of systems are positive for none.   All other systems are reviewed and negative.    PHYSICAL EXAM: VS:  BP 132/70   Pulse 93   Temp (!) 97.5 F (36.4 C)   Ht 6' (1.829 m)   Wt 208 lb (94.3 kg)   SpO2 97%   BMI 28.21 kg/m  , BMI Body mass index is 28.21 kg/m. GENERAL:  Well appearing NECK:  No jugular venous distention, waveform within normal limits, carotid upstroke brisk and symmetric, no bruits, no thyromegaly LUNGS:  Clear to auscultation bilaterally CHEST:  Unremarkable HEART:  PMI not displaced or sustained,S1  and S2 within normal limits, no S3, no S4, no clicks, no rubs, no murmurs ABD:  Flat, positive bowel sounds normal in frequency in pitch, no bruits, no rebound, no guarding, no midline pulsatile mass, no hepatomegaly, no splenomegaly EXT:  2 plus pulses throughout, no edema, no cyanosis no clubbing  EKG:  EKG is not ordered today.   Recent Labs: 03/27/2019: ALT 25; Hemoglobin 14.6; Platelets 263 09/26/2019: BUN 23; Creatinine, Ser 1.17; Potassium 4.6; Sodium 139    Lipid Panel    Component Value Date/Time   CHOL 150 03/27/2019 1146   CHOL 137 11/22/2013 1232   TRIG 155 (H) 03/27/2019 1146   TRIG 161 (H) 08/18/2016 1142   TRIG 192 (H) 11/22/2013 1232   HDL 36 (L) 03/27/2019 1146   HDL 38 (L) 08/18/2016 1142   HDL 33 (L) 11/22/2013 1232   CHOLHDL 4.2 03/27/2019 1146   LDLCALC 83 03/27/2019 1146   LDLCALC 86 07/04/2014 0955   LDLCALC 66 11/22/2013 1232      Wt Readings from Last 3 Encounters:  10/06/19 208 lb (94.3 kg)  08/03/19 209 lb (94.8 kg)  07/07/19 203 lb 3.2 oz (92.2 kg)      Other studies Reviewed: Additional studies/ records that were reviewed today include:   CT Review of the above records demonstrates:  See elsewhere   ASSESSMENT AND PLAN:  CAD:   The patient has coronary disease as described but has no symptoms related to this.  We spent a long time talking about optimal medical therapy versus intervention and results of clinical trials.  In the absence of unstable symptoms (all for that matter symptoms at all) or high risk stress perfusion study I cannot suggest coronary intervention.  So I will screen him with a stress perfusion study.  He has a resistance to lifestyle changes and says she just cannot persist with eating properly.  I discussed the overall importance of this as probably the most important thing we can discuss along with exercise, control of his diabetes and his lipids.  DM: I am going to refer him for endocrinology consultation.   HTN:  The  blood pressure is at target.  No change in therapy.   DYSLIPIDEMIA:   His LDL is not at goal.  I am going to change him to Crestor 40 mg daily stopping his Lipitor with a repeat lipid profile liver enzymes in about 10 weeks.    Current medicines are reviewed at length with the patient today.  The patient does not have concerns regarding medicines.  The following changes have been made:  As above.  Labs/ tests ordered today include:   Orders Placed This Encounter  Procedures  . Lipid Profile  . Comprehensive Metabolic Panel (CMET)  . Ambulatory referral to Endocrinology  . Myocardial Perfusion Imaging     Disposition:   FU with me as me after the perfusion study.      Signed, Minus Breeding, MD  10/06/2019 1:28 PM    [facility]

## 2019-10-06 ENCOUNTER — Ambulatory Visit (INDEPENDENT_AMBULATORY_CARE_PROVIDER_SITE_OTHER): Payer: BC Managed Care – PPO | Admitting: Cardiology

## 2019-10-06 ENCOUNTER — Encounter: Payer: Self-pay | Admitting: Cardiology

## 2019-10-06 ENCOUNTER — Other Ambulatory Visit: Payer: Self-pay

## 2019-10-06 VITALS — BP 132/70 | HR 93 | Temp 97.5°F | Ht 72.0 in | Wt 208.0 lb

## 2019-10-06 DIAGNOSIS — Z7189 Other specified counseling: Secondary | ICD-10-CM | POA: Diagnosis not present

## 2019-10-06 DIAGNOSIS — I10 Essential (primary) hypertension: Secondary | ICD-10-CM

## 2019-10-06 DIAGNOSIS — I2510 Atherosclerotic heart disease of native coronary artery without angina pectoris: Secondary | ICD-10-CM | POA: Diagnosis not present

## 2019-10-06 DIAGNOSIS — Z8249 Family history of ischemic heart disease and other diseases of the circulatory system: Secondary | ICD-10-CM

## 2019-10-06 DIAGNOSIS — E109 Type 1 diabetes mellitus without complications: Secondary | ICD-10-CM

## 2019-10-06 MED ORDER — ROSUVASTATIN CALCIUM 40 MG PO TABS: 40.0000 mg | ORAL_TABLET | Freq: Every day | ORAL | 3 refills | Status: DC

## 2019-10-06 NOTE — Patient Instructions (Addendum)
Medication Instructions:  STOP ATORVASTATIN   START ROSUVASTATIN 40 MG DAILY   *If you need a refill on your cardiac medications before your next appointment, please call your pharmacy*  Lab Work: FASTING LP/CMET 10 WEEKS  If you have labs (blood work) drawn today and your tests are completely normal, you will receive your results only by: Marland Kitchen MyChart Message (if you have MyChart) OR . A paper copy in the mail If you have any lab test that is abnormal or we need to change your treatment, we will call you to review the results.  Testing/Procedures: Your physician has requested that you have a lexiscan myoview. For further information please visit [email or web address]. Please follow instruction sheet, as given.  Follow-Up: AFTER LEXISCAN IN [place]  You have been referred to    Ambulatory referral to Endocrinology Philemon Kingdom, MD)  Where: [facility] Address: [address], [place] [address] Phone: [phone]  IF YOU DO NOT HEAR FROM THE OFFICE CALL THEM DIRECTLY AT ABOVE NUMBER   Other Instructions   Cardiac Nuclear Scan A cardiac nuclear scan is a test that is done to check the flow of blood to your heart. It is done when you are resting and when you are exercising. The test looks for problems such as:  Not enough blood reaching a portion of the heart.  The heart muscle not working as it should. You may need this test if:  You have heart disease.  You have had lab results that are not normal.  You have had heart surgery or a balloon procedure to open up blocked arteries (angioplasty).  You have chest pain.  You have shortness of breath. In this test, a special dye (tracer) is put into your bloodstream. The tracer will travel to your heart. A camera will then take pictures of your heart to see how the tracer moves through your heart. This test is usually done at a hospital and takes 2-4 hours. Tell a doctor about:  Any  allergies you have.  All medicines you are taking, including vitamins, herbs, eye drops, creams, and over-the-counter medicines.  Any problems you or family members have had with anesthetic medicines.  Any blood disorders you have.  Any surgeries you have had.  Any medical conditions you have.  Whether you are pregnant or may be pregnant. What are the risks? Generally, this is a safe test. However, problems may occur, such as:  Serious chest pain and heart attack. This is only a risk if the stress portion of the test is done.  Rapid heartbeat.  A feeling of warmth in your chest. This feeling usually does not last long.  Allergic reaction to the tracer. What happens before the test?  Ask your doctor about changing or stopping your normal medicines. This is important.  Follow instructions from your doctor about what you cannot eat or drink.  Remove your jewelry on the day of the test. What happens during the test?  An IV tube will be inserted into one of your veins.  Your doctor will give you a small amount of tracer through the IV tube.  You will wait for 20-40 minutes while the tracer moves through your bloodstream.  Your heart will be monitored with an electrocardiogram (ECG).  You will lie down on an exam table.  Pictures of your heart will be taken for about 15-20 minutes.  You may also have a stress test. For this test, one of these things may be  done: ? You will be asked to exercise on a treadmill or a stationary bike. ? You will be given medicines that will make your heart work harder. This is done if you are unable to exercise.  When blood flow to your heart has peaked, a tracer will again be given through the IV tube.  After 20-40 minutes, you will get back on the exam table. More pictures will be taken of your heart.  Depending on the tracer that is used, more pictures may need to be taken 3-4 hours later.  Your IV tube will be removed when the test is  over. The test may vary among doctors and hospitals. What happens after the test?  Ask your doctor: ? Whether you can return to your normal schedule, including diet, activities, and medicines. ? Whether you should drink more fluids. This will help to remove the tracer from your body. Drink enough fluid to keep your pee (urine) pale yellow.  Ask your doctor, or the department that is doing the test: ? When will my results be ready? ? How will I get my results? Summary  A cardiac nuclear scan is a test that is done to check the flow of blood to your heart.  Tell your doctor whether you are pregnant or may be pregnant.  Before the test, ask your doctor about changing or stopping your normal medicines. This is important.  Ask your doctor whether you can return to your normal activities. You may be asked to drink more fluids. This information is not intended to replace advice given to you by your health care provider. Make sure you discuss any questions you have with your health care provider. Document Revised: 01/04/2019 Document Reviewed: 02/28/2018 Elsevier Patient Education  [place].

## 2019-10-11 ENCOUNTER — Telehealth (HOSPITAL_COMMUNITY): Payer: Self-pay

## 2019-10-11 NOTE — Telephone Encounter (Signed)
Encounter complete. 

## 2019-10-12 ENCOUNTER — Telehealth: Payer: Self-pay | Admitting: Endocrinology

## 2019-10-12 ENCOUNTER — Other Ambulatory Visit: Payer: Self-pay

## 2019-10-12 NOTE — Telephone Encounter (Signed)
error 

## 2019-10-13 ENCOUNTER — Ambulatory Visit (HOSPITAL_COMMUNITY)
Admission: RE | Admit: 2019-10-13 | Discharge: 2019-10-13 | Disposition: A | Discharge status: Home / Self Care | Payer: BC Managed Care – PPO | Source: Ambulatory Visit | Attending: Cardiology | Admitting: Cardiology

## 2019-10-13 ENCOUNTER — Ambulatory Visit: Payer: BC Managed Care – PPO | Admitting: Family Medicine

## 2019-10-13 DIAGNOSIS — I10 Essential (primary) hypertension: Secondary | ICD-10-CM

## 2019-10-13 DIAGNOSIS — Z8249 Family history of ischemic heart disease and other diseases of the circulatory system: Secondary | ICD-10-CM | POA: Diagnosis present

## 2019-10-13 DIAGNOSIS — I2510 Atherosclerotic heart disease of native coronary artery without angina pectoris: Secondary | ICD-10-CM | POA: Insufficient documentation

## 2019-10-13 LAB — MYOCARDIAL PERFUSION IMAGING
LV dias vol: 91 mL (ref 62–150)
LV sys vol: 43 mL
Peak HR: 110 {beats}/min
Rest HR: 81 {beats}/min
SDS: 1
SRS: 1
SSS: 2
TID: 1.36

## 2019-10-13 MED ORDER — TECHNETIUM TC 99M TETROFOSMIN IV KIT
10.2000 | PACK | Freq: Once | INTRAVENOUS | Status: AC | PRN
  Administered 2019-10-13: 10.2 via INTRAVENOUS
  Filled 2019-10-13: qty 11

## 2019-10-13 MED ORDER — TECHNETIUM TC 99M TETROFOSMIN IV KIT
32.6000 | PACK | Freq: Once | INTRAVENOUS | Status: AC | PRN
  Administered 2019-10-13: 32.6 via INTRAVENOUS
  Filled 2019-10-13: qty 33

## 2019-10-13 MED ORDER — REGADENOSON 0.4 MG/5ML IV SOLN
0.4000 mg | Freq: Once | INTRAVENOUS | Status: AC
  Administered 2019-10-13: 0.4 mg via INTRAVENOUS

## 2019-10-16 NOTE — Progress Notes (Signed)
Optimal medical therapy

## 2019-10-20 ENCOUNTER — Ambulatory Visit: Payer: Self-pay | Admitting: Cardiology

## 2019-10-25 ENCOUNTER — Other Ambulatory Visit: Payer: Self-pay

## 2019-10-26 NOTE — Progress Notes (Addendum)
Patient ID: Austin Graham, male   DOB: 1950/12/02, 69 y.o.   MRN: [id number]   This visit occurred during the SARS-CoV-2 public health emergency.  Safety protocols were in place, including screening questions prior to the visit, additional usage of staff PPE, and extensive cleaning of exam room while observing appropriate contact time as indicated for disinfecting solutions.   HPI: Austin Graham is a 69 y.o.-year-old male, referred by his cardiologist, Dr. Percival Spanish, for management of DM2, dx in ~2013, non-insulin-dependent, uncontrolled, with complications (CAD, CKD).  Reviewed latest HbA1c levels: Lab Results  Component Value Date   HGBA1C 7.5 (H) 07/07/2019   HGBA1C 7.2 (H) 03/27/2019   HGBA1C 9.1 (H) 11/11/2018   HGBA1C 9.3 (H) 08/09/2018   HGBA1C 7.5 (H) 02/10/2018   HGBA1C 9.7 (H) 10/29/2017   HGBA1C 9.3 (H) 01/15/2017   HGBA1C 8.5 (H) 08/18/2016   HGBA1C 7.9 (H) 04/01/2016   HGBA1C 8.4 11/20/2015   Pt is on a regimen of: - JanuMet ER 50-1000 mg 2x a day, with meals - Rybelsus 3 mg daily before breakfast - started few weeks ago Tried Trulicity >> nausea, lightheadedness.  Pt checks his sugars 1-2x a day and they are: - am: 138, 140-190, 200 - 2h after b'fast: n/c - before lunch: n/c - 2h after lunch: 200-225 - before dinner: n/c - 2h after dinner: n/c - bedtime: 180-220 - nighttime: n/c Lowest sugar was 138; ? hypoglycemia awareness..  Highest sugar was 303 - last Christmas.  Glucometer: One Touch ultra  Pt's meals are: - Breakfast: oatmeal, cereals, granola - Lunch: sandwich on rye or whole wheat; bacon and eggs - Dinner: vegetable, potatoes, meat or pasta - Snacks: 2-3  He will start exercising on a stationary bike.  - + mild CKD, last BUN/creatinine:  Lab Results  Component Value Date   BUN 23 09/26/2019   BUN 21 07/07/2019   CREATININE 1.17 09/26/2019   CREATININE 1.06 07/07/2019  On Benicar 20.  -+ HL; last set of lipids: Lab Results  Component  Value Date   CHOL 150 03/27/2019   HDL 36 (L) 03/27/2019   LDLCALC 83 03/27/2019   TRIG 155 (H) 03/27/2019   CHOLHDL 4.2 03/27/2019  On Crestor 40, Vascepa.  - last eye exam was on 08/11/2019: No DR.   - no numbness and tingling in his feet. He has a foor sput - plantar fasciitis.  Pt has FH of DM in F, S, GM.  He also has a history of HTN, GERD, multinodular goiter.  Thyroid ultrasound (07/05/2012) showed very small nodules, stable: Right thyroid lobe:  5.0 x 1.7 x 2.3 cm. Left thyroid lobe:  5.6 x 1.9 x 2.4 cm. Isthmus:  Focal nodules:  Multiple bilateral small thyroid nodules.  The largest on the right is 10 mm in the lower pole compared 13 mm previously.  The largest on the left is 10 mm in the mid pole compared to 9 mm on the previous study.  No significant change.  No dominant nodule.  Lymphadenopathy:   None.  IMPRESSION: Numerous bilateral small thyroid nodules, none significantly changed.  He saw Dr. Dalbert Batman in the past and had FNAs:  Date Taken: 05/10/2008  Satisfactory for evaluation.   INTERPRETATION(S):  THYROID, LEFT, FINE NEEDLE ASPIRATION (THIN PREP, SMEARS BLOCK):  - FINDINGS CONSISTENT WITH NON-NEOPLASTIC GOITER.  - SEE COMMENT.   Date Taken: 05/10/2008  Satisfactory for evaluation.   INTERPRETATION(S):  THYROID, RIGHT, FINE NEEDLE ASPIRATION (THIN PREP, SMEARS BLOCK):  - FINDINGS CONSISTENT  WITH NON-NEOPLASTIC GOITER.  - SEE COMMENT.   Reviewed latest TSH: Lab Results  Component Value Date   TSH 2.710 02/10/2018   TSH 3.820 04/01/2016   Pt denies: - feeling nodules in neck - hoarseness - dysphagia - choking - SOB with lying down  No FH of thyroid cancer. No h/o radiation tx to head or neck. +FH of thyroid ds in sister, GM  He also has BPH with increased nocturia.  ROS: Constitutional: no weight gain, no weight loss, + increased appetite + fatigue, no subjective hyperthermia, no subjective hypothermia, + nocturia, +  excessive urination, + poor sleep Eyes: no blurry vision, no xerophthalmia ENT: no sore throat, + see HPI, no tinnitus, no hypoacusis Cardiovascular: no CP, no SOB, no palpitations, no leg swelling Respiratory: no cough, no SOB, no wheezing Gastrointestinal: no N, no V, no D, no C, + acid reflux Musculoskeletal: no muscle, + joint aches Skin: no rash, no hair loss Neurological: no tremors, no numbness or tingling/no dizziness/no HAs Psychiatric: no depression, no anxiety + Difficulty with erections  Past Medical History:  Diagnosis Date  . Arthritis   . Asthma    as child  . Complication of anesthesia    hard to waken  . Diabetes ([place])    type II  . Diverticulosis   . Dysrhythmia    PAC's  . Goiter   . History of hiatal hernia   . Hyperlipidemia   . Hypertension   . Kidney stones   . Stress fracture    left knee, right hip  . Thyroid disease    Past Surgical History:  Procedure Laterality Date  . CYSTOSCOPY WITH INSERTION OF UROLIFT    . JOINT REPLACEMENT    . KIDNEY STONES  2006  . SKIN CANCER DESTRUCTION Left    deltoid- [facility]  . SKIN CANCER EXCISION Right 09/16/2018   shoulder   . TONSILLECTOMY    . 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   Procedure: RIGHT TOTAL HIP ARTHROPLASTY ANTERIOR APPROACH;  Surgeon: Gaynelle Arabian, MD;  Location: [facility];  Service: Orthopedics;  Laterality: Right;  . WISDOM TOOTH EXTRACTION     Social History   Socioeconomic History  . Marital status: Married    Spouse name: Not on file  . Number of children: 3  . Years of education: Not on file  . Highest education level: Not on file  Occupational History    Employer:  Funeral director's assistant  Tobacco Use  . Smoking status: Former Smoker    Quit date: 11/02/1974    Years since quitting: 45.0  . Smokeless tobacco: Never Used  Substance and Sexual Activity  . Alcohol use: Yes    Alcohol/week: 1.0 - 2.0 standard drinks    Types: 1 - 2 Standard drinks or equivalent per week     Comment: 2-3 PER WEEK  . Drug use: no    Types:   . Sexual activity: Not on file  Other Topics Concern  . Not on file  Social History Narrative   Lives at home with wife.     Social Determinants of Health   Financial Resource Strain:   . Difficulty of Paying Living Expenses: Not on file  Food Insecurity:   . Worried About Charity fundraiser in the Last Year: Not on file  . Ran Out of Food in the Last Year: Not on file  Transportation Needs:   . Lack of Transportation (Medical): Not on file  . Lack of Transportation (  Non-Medical): Not on file  Physical Activity:   . Days of Exercise per Week: Not on file  . Minutes of Exercise per Session: Not on file  Stress:   . Feeling of Stress : Not on file  Social Connections:   . Frequency of Communication with Friends and Family: Not on file  . Frequency of Social Gatherings with Friends and Family: Not on file  . Attends Religious Services: Not on file  . Active Member of Clubs or Organizations: Not on file  . Attends Archivist Meetings: Not on file  . Marital Status: Not on file  Intimate Partner Violence:   . Fear of Current or Ex-Partner: Not on file  . Emotionally Abused: Not on file  . Physically Abused: Not on file  . Sexually Abused: Not on file   Current Outpatient Medications on File Prior to Visit  Medication Sig Dispense Refill  . aspirin 81 MG tablet Take by mouth daily.     . Cholecalciferol (VITAMIN D) 2000 units CAPS Take 1 capsule by mouth daily.    Marland Kitchen glucose blood test strip 1 each by Other route 2 (two) times daily. One touch 100 each 5  . Icosapent Ethyl (VASCEPA) 1 g CAPS Take 2 capsules (2 g total) by mouth 2 (two) times daily. 360 capsule 3  . olmesartan (BENICAR) 20 MG tablet Take 0.5 tablets (10 mg total) by mouth daily. 45 tablet 3  . ONE TOUCH ULTRA TEST test strip CHECK BLOOD SUGAR DAILY AND AS NEEDED. E11.9 100 each 3  . rosuvastatin (CRESTOR) 40 MG tablet Take 1 tablet (40 mg total) by  mouth daily. 90 tablet 3  . Semaglutide (RYBELSUS) 3 MG TABS Take by mouth.    . SitaGLIPtin-MetFORMIN HCl (JANUMET XR) 50-1000 MG TB24 Take 1 tablet by mouth 2 (two) times daily. 180 tablet 3  . Vitamins/Minerals TABS Take by mouth.     No current facility-administered medications on file prior to visit.   Allergies  Allergen Reactions  . Codeine     REACTION: agitation  . Erythromycin     REACTION: diarrhea  . Latex     rash  . Other     Melon- itchy throat   Family History  Problem Relation Age of Onset  . Cancer Sister        ovarian  . Diabetes Sister   . Thyroid disease Sister   . Breast cancer Mother 46  . Hypertension Mother   . Cancer Mother   . CAD Father 64       Died age 62 after CABG  . Stroke Father   . Hypertension Father   . AAA (abdominal aortic aneurysm) Father   . Hyperlipidemia Father   . Heart disease Father     PE: BP 120/62   Pulse 94   Ht 6' (1.829 m)   Wt 206 lb (93.4 kg)   SpO2 97%   BMI 27.94 kg/m  Wt Readings from Last 3 Encounters:  10/27/19 206 lb (93.4 kg)  10/13/19 208 lb (94.3 kg)  10/06/19 208 lb (94.3 kg)   Constitutional: overweight, in NAD Eyes: PERRLA, EOMI, no exophthalmos ENT: moist mucous membranes, no thyromegaly, no cervical lymphadenopathy Cardiovascular: RRR, No MRG Respiratory: CTA B Gastrointestinal: abdomen soft, NT, ND, BS+ Musculoskeletal: no deformities, strength intact in all 4 Skin: moist, warm, no rashes Neurological: no tremor with outstretched hands, DTR normal in all 4  ASSESSMENT: 1. DM2, non-insulin-dependent, uncontrolled, with complications - CAD -  CKD  2. MNG  PLAN:  1. Patient with long-standing, uncontrolled diabetes, on oral antidiabetic regimen, which still poor control, and an HbA1c of 7.5% at last check.   At today's visit, we rechecked his HbA1c and this was 8.9% (higher). - He mentions that he had many dietary indiscretions over the holidays and he was not surprised that his HbA1c  was higher.  He is planning to start improving his diet (we discussed about specific changes that I recommend and also gave him references) and he also plans to start exercise.  He now has stationary bike and will start using it.  He was previously walking but he has heel spur and plantar fasciitis in case he cannot walk much anymore. - He is on a regimen containing Metformin, DPP 4 inhibitor, and the oral GLP-1 receptor agonist-low-dose - We discussed that in the presence of a GLP-1 receptor agonist, the DPP 4 inhibitor is superfluous.  At this visit, we will stop Janumet and start  Metformin ER. He has a history of CKD and would also benefit from an SGLT2 inhibitor.  He agrees to start [place].  We discussed about benefits and possible side effects.  I advised him to stay well-hydrated while on this medication.  Strongly advised him to start diet sodas and gave him some ideas about healthier drinks.  He is also on a very low dose of Rybelsus which he tolerates well.  We will first increase to 7 mg daily and then to 14 mg daily in a month, if he tolerates it well - he would like to avoid injections for now.  In the future, he would be a good candidate for Ozempic.  He did try Trulicity in the past and had some nausea and dizziness from it. - I suggested to:  Patient Instructions  Please stop JanuMet.  Start: - Metformin ER 1000 mg 2x a day with meals  Please increase: - Rybelsus to 7 mg before b'fast for 4 weeks, then increase to 14 mg daily  Please add: - Farxiga 10 mg before b'fast  Please stop at the lab.  Please return in 3 months with your sugar log.    - Strongly advised him to start checking sugars at different times of the day - check 1-2x a day, rotating checks - discussed about CBG targets for treatment: 80-130 mg/dL before meals and <180 mg/dL after meals; target HbA1c <7%. - given sugar log and advised how to fill it and to bring it at next appt  - given foot care handout and  explained the principles  - given instructions for hypoglycemia management "15-15 rule"  - advised for yearly eye exams  - Return to clinic in 3 mo with sugar log   2. MNG -No neck compression symptoms -Previously investigated with ultrasound (in 2013-reviewed) and also FNA (in 2009-reviewed) and these were benign. -We will recheck his TFTs today (previously normal in 2019) -If TFTs normal, will check a thyroid ultrasound  Orders Placed This Encounter  Procedures  . TSH  . T4, free  . T3, free   Office Visit on 10/27/2019  Component Date Value Ref Range Status  . TSH 10/27/2019 1.95  0.35 - 4.50 uIU/mL Final  . Free T4 10/27/2019 0.76  0.60 - 1.60 ng/dL Final   Comment: Specimens from patients who are undergoing biotin therapy and /or ingesting biotin supplements may contain high levels of biotin.  The higher biotin concentration in these specimens interferes with this Free T4  assay.  Specimens that contain high levels  of biotin may cause false high results for this Free T4 assay.  Please interpret results in light of the total clinical presentation of the patient.    . T3, Free 10/27/2019 3.1  2.3 - 4.2 pg/mL Final  . Hemoglobin A1C 10/27/2019 8.9* 4.0 - 5.6 % Final   Thyroid tests are normal.  We will go ahead and ordered a thyroid ultrasound.  Narrative & Impression    CLINICAL DATA:  Nodules, follow-up. Previous FNA biopsy of right and inferior left nodules 05/10/2008.  EXAM: THYROID ULTRASOUND  TECHNIQUE: Ultrasound examination of the thyroid gland and adjacent soft tissues was performed.  COMPARISON:  07/05/2012 and previous  FINDINGS: Parenchymal Echotexture: Moderately heterogenous  Isthmus: 0.4 cm thickness, stable  Right lobe: 5.6 x 1.7 x 2.1 cm, previously 5.3 x 2.1 x 2.1  Left lobe: 4.9 x 2 x 1.9 cm, previously 5.4 x 2.2 x 1.7  _________________________________________________________  Estimated total number of nodules >/= 1 cm: 1  Number  of spongiform nodules >/=  2 cm not described below (TR1): 0  Number of mixed cystic and solid nodules >/= 1.5 cm not described below ([place]): 0  _________________________________________________________  Nodule # 1:  Prior biopsy: No  Location: Right; Inferior  Maximum size: 0.98 cm; Other 2 dimensions: 0.8 x 0.8 cm, previously, 0.7 x 0.6 x 0.6 cm  Composition: mixed cystic and solid (1)  Echogenicity: hypoechoic (2)  Shape: not taller-than-wide (0)  Margins: ill-defined (0)  Echogenic foci: none (0)  ACR TI-RADS total points: 3.  ACR TI-RADS risk category:  TR3 (3 points).  Significant change in size (>/= 20% in two dimensions and minimal increase of 2 mm): Yes  Change in features: No  Change in ACR TI-RADS risk category: No  ACR TI-RADS recommendations:  Given size (<1.4 cm) and appearance, this nodule does NOT meet TI-RADS criteria for biopsy or dedicated follow-up.  _________________________________________________________  0.6 cm mixed solid/cystic inferior left nodule, previously 0.7; This nodule does NOT meet TI-RADS criteria for biopsy or dedicated follow-up.  Additional scattered hypoechoic lesions throughout both lobes all less than 0.5 cm diameter.  IMPRESSION: 1. Stable mild thyromegaly with scattered subcentimeter nodules. None meet criteria for biopsy or dedicated imaging follow-up.  The above is in keeping with the ACR TI-RADS recommendations - J Am Coll Radiol 2017;14:587-595.   Electronically Signed   By: Lucrezia Europe M.D.   On: 11/03/2019 16:18            Philemon Kingdom, MD PhD [facility]

## 2019-10-27 ENCOUNTER — Ambulatory Visit (INDEPENDENT_AMBULATORY_CARE_PROVIDER_SITE_OTHER): Payer: BC Managed Care – PPO | Admitting: Internal Medicine

## 2019-10-27 ENCOUNTER — Encounter: Payer: Self-pay | Admitting: Internal Medicine

## 2019-10-27 ENCOUNTER — Other Ambulatory Visit: Payer: Self-pay

## 2019-10-27 VITALS — BP 120/62 | HR 94 | Ht 72.0 in | Wt 206.0 lb

## 2019-10-27 DIAGNOSIS — E1159 Type 2 diabetes mellitus with other circulatory complications: Secondary | ICD-10-CM | POA: Diagnosis not present

## 2019-10-27 DIAGNOSIS — E1165 Type 2 diabetes mellitus with hyperglycemia: Secondary | ICD-10-CM | POA: Diagnosis not present

## 2019-10-27 DIAGNOSIS — E042 Nontoxic multinodular goiter: Secondary | ICD-10-CM

## 2019-10-27 LAB — POCT GLYCOSYLATED HEMOGLOBIN (HGB A1C): Hemoglobin A1C: 8.9 % — AB (ref 4.0–5.6)

## 2019-10-27 LAB — T3, FREE: T3, Free: 3.1 pg/mL (ref 2.3–4.2)

## 2019-10-27 LAB — T4, FREE: Free T4: 0.76 ng/dL (ref 0.60–1.60)

## 2019-10-27 LAB — TSH: TSH: 1.95 u[IU]/mL (ref 0.35–4.50)

## 2019-10-27 MED ORDER — FARXIGA 10 MG PO TABS: 10.0000 mg | ORAL_TABLET | Freq: Every day | ORAL | 11 refills | Status: DC

## 2019-10-27 MED ORDER — METFORMIN HCL ER 500 MG PO TB24: 1000.0000 mg | ORAL_TABLET | Freq: Two times a day (BID) | ORAL | 3 refills | Status: DC

## 2019-10-27 MED ORDER — RYBELSUS 7 MG PO TABS: 1.0000 | ORAL_TABLET | Freq: Every day | ORAL | 3 refills | Status: DC

## 2019-10-27 NOTE — Patient Instructions (Addendum)
Please stop JanuMet.  Start: - Metformin ER 1000 mg 2x a day with meals  Please increase: - Rybelsus to 7 mg before b'fast for 4 weeks, then increase to 14 mg daily  Please add: - Farxiga 10 mg before b'fast  Please stop at the lab.  Please return in 3 months with your sugar log.   PATIENT INSTRUCTIONS FOR TYPE 2 DIABETES:  DIET AND EXERCISE Diet and exercise is an important part of diabetic treatment.  We recommended aerobic exercise in the form of brisk walking (working between 40-60% of maximal aerobic capacity, similar to brisk walking) for 150 minutes per week (such as 30 minutes five days per week) along with 3 times per week performing 'resistance' training (using various gauge rubber tubes with handles) 5-10 exercises involving the major muscle groups (upper body, lower body and core) performing 10-15 repetitions (or near fatigue) each exercise. Start at half the above goal but build slowly to reach the above goals. If limited by weight, joint pain, or disability, we recommend daily walking in a swimming pool with water up to waist to reduce pressure from joints while allow for adequate exercise.    BLOOD GLUCOSES Monitoring your blood glucoses is important for continued management of your diabetes. Please check your blood glucoses 2-4 times a day: fasting, before meals and at bedtime (you can rotate these measurements - e.g. one day check before the 3 meals, the next day check before 2 of the meals and before bedtime, etc.).   HYPOGLYCEMIA (low blood sugar) Hypoglycemia is usually a reaction to not eating, exercising, or taking too much insulin/ other diabetes drugs.  Symptoms include tremors, sweating, hunger, confusion, headache, etc. Treat IMMEDIATELY with 15 grams of Carbs: . 4 glucose tablets .  cup regular juice/soda . 2 tablespoons raisins . 4 teaspoons sugar . 1 tablespoon honey Recheck blood glucose in 15 mins and repeat above if still symptomatic/blood glucose  <100.  RECOMMENDATIONS TO REDUCE YOUR RISK OF DIABETIC COMPLICATIONS: * Take your prescribed MEDICATION(S) * Follow a DIABETIC diet: Complex carbs, fiber rich foods, (monounsaturated and polyunsaturated) fats * AVOID saturated/trans fats, high fat foods, >2,300 mg salt per day. * EXERCISE at least 5 times a week for 30 minutes or preferably daily.  * DO NOT SMOKE OR DRINK more than 1 drink a day. * Check your FEET every day. Do not wear tightfitting shoes. Contact us if you develop an ulcer * See your EYE doctor once a year or more if needed * Get a FLU shot once a year * Get a PNEUMONIA vaccine once before and once after age 80 years  GOALS:  * Your Hemoglobin A1c of <7%  * fasting sugars need to be <130 * after meals sugars need to be <180 (2h after you start eating) * Your Systolic BP should be [id number] or lower  * Your Diastolic BP should be 80 or lower  * Your HDL (Good Cholesterol) should be 40 or higher  * Your LDL (Bad Cholesterol) should be 100 or lower. * Your Triglycerides should be 150 or lower  * Your Urine microalbumin (kidney function) should be <30 * Your Body Mass Index should be 25 or lower    Please consider the following ways to cut down carbs and fat and increase fiber and micronutrients in your diet: - substitute whole grain for white bread or pasta - substitute brown rice for white rice - substitute 90-calorie flat bread pieces for slices of bread when possible -  substitute sweet potatoes or yams for white potatoes - substitute humus for margarine - substitute tofu for cheese when possible - substitute almond or rice milk for regular milk (would not drink soy milk daily due to concern for soy estrogen influence on breast cancer risk) - substitute dark chocolate for other sweets when possible - substitute water - can add lemon or orange slices for taste - for diet sodas (artificial sweeteners will trick your body that you can eat sweets without getting calories and  will lead you to overeating and weight gain in the long run) - do not skip breakfast or other meals (this will slow down the metabolism and will result in more weight gain over time)  - can try smoothies made from fruit and almond/rice milk in am instead of regular breakfast - can also try old-fashioned (not instant) oatmeal made with almond/rice milk in am - order the dressing on the side when eating salad at a restaurant (pour less than half of the dressing on the salad) - eat as little meat as possible - can try juicing, but should not forget that juicing will get rid of the fiber, so would alternate with eating raw veg./fruits or drinking smoothies - use as little oil as possible, even when using olive oil - can dress a salad with a mix of balsamic vinegar and lemon juice, for e.g. - use agave nectar, stevia sugar, or regular sugar rather than artificial sweateners - steam or broil/roast veggies  - snack on veggies/fruit/nuts (unsalted, preferably) when possible, rather than processed foods - reduce or eliminate aspartame in diet (it is in diet sodas, chewing gum, etc) Read the labels!  Read the following books: Dr. Alyssa Grove - Program for Reversing Diabetes Rip Cyd Silence - The Engine 2 diet and the Engine 2 Cookbook Dr. Karl Luke - Prevent and Reverse Heart Disease Dr. Alden Benjamin - How Not to Die

## 2019-11-03 ENCOUNTER — Ambulatory Visit
Admission: RE | Admit: 2019-11-03 | Discharge: 2019-11-03 | Disposition: A | Discharge status: Home / Self Care | Payer: BC Managed Care – PPO | Source: Ambulatory Visit | Attending: Internal Medicine | Admitting: Internal Medicine

## 2019-11-03 DIAGNOSIS — E042 Nontoxic multinodular goiter: Secondary | ICD-10-CM

## 2019-12-01 ENCOUNTER — Other Ambulatory Visit: Payer: Self-pay

## 2019-12-04 ENCOUNTER — Ambulatory Visit: Payer: BC Managed Care – PPO | Admitting: Family Medicine

## 2019-12-05 ENCOUNTER — Encounter: Payer: Self-pay | Admitting: Family Medicine

## 2019-12-11 ENCOUNTER — Encounter: Payer: Self-pay | Admitting: *Deleted

## 2019-12-22 ENCOUNTER — Other Ambulatory Visit: Payer: BC Managed Care – PPO

## 2019-12-22 ENCOUNTER — Ambulatory Visit: Payer: BC Managed Care – PPO | Attending: Internal Medicine

## 2019-12-22 DIAGNOSIS — Z20822 Contact with and (suspected) exposure to covid-19: Secondary | ICD-10-CM

## 2019-12-23 LAB — NOVEL CORONAVIRUS, NAA: SARS-CoV-2, NAA: NOT DETECTED

## 2019-12-23 LAB — SARS-COV-2, NAA 2 DAY TAT

## 2019-12-25 ENCOUNTER — Telehealth: Payer: Self-pay | Admitting: Internal Medicine

## 2019-12-25 NOTE — Telephone Encounter (Signed)
It is difficult to tell if and which of these medicines could have contributed... We need to reintroduce them slowly and see how he feels.  For now I would suggest to introduce Crestor and Farxiga for the next 5 days or so, then Metformin, for another 5 days, then Rybelsus.  Please try to stay very well-hydrated while restarting the medications.  Please let us know how this goes.

## 2019-12-25 NOTE — Telephone Encounter (Signed)
[phone]  Patient starting feeling sick for about a week now, he says he's been experiencing nausea, upper stomach pain, indigestion, loss of appetite, diarrhea, aches, pains and fever. Says he got a covid test and was negative, he says he stopped taking rybelsus, farxiga, metformin, crestor, and he says his symptoms lessened - please advise.

## 2019-12-26 ENCOUNTER — Encounter: Payer: Self-pay | Admitting: Internal Medicine

## 2019-12-26 NOTE — Telephone Encounter (Signed)
See MyChart message

## 2020-01-12 ENCOUNTER — Ambulatory Visit: Payer: BC Managed Care – PPO | Admitting: Cardiology

## 2020-01-18 ENCOUNTER — Other Ambulatory Visit: Payer: Self-pay | Admitting: Family Medicine

## 2020-01-18 ENCOUNTER — Other Ambulatory Visit: Payer: Self-pay

## 2020-01-18 ENCOUNTER — Encounter: Payer: Self-pay | Admitting: Family Medicine

## 2020-01-18 ENCOUNTER — Ambulatory Visit (INDEPENDENT_AMBULATORY_CARE_PROVIDER_SITE_OTHER): Payer: BC Managed Care – PPO | Admitting: Family Medicine

## 2020-01-18 VITALS — BP 112/65 | HR 91 | Temp 98.1°F | Ht 72.0 in | Wt 193.4 lb

## 2020-01-18 DIAGNOSIS — E782 Mixed hyperlipidemia: Secondary | ICD-10-CM | POA: Diagnosis not present

## 2020-01-18 DIAGNOSIS — K449 Diaphragmatic hernia without obstruction or gangrene: Secondary | ICD-10-CM

## 2020-01-18 DIAGNOSIS — E1159 Type 2 diabetes mellitus with other circulatory complications: Secondary | ICD-10-CM

## 2020-01-18 DIAGNOSIS — M75111 Incomplete rotator cuff tear or rupture of right shoulder, not specified as traumatic: Secondary | ICD-10-CM

## 2020-01-18 DIAGNOSIS — E1169 Type 2 diabetes mellitus with other specified complication: Secondary | ICD-10-CM | POA: Diagnosis not present

## 2020-01-18 DIAGNOSIS — I10 Essential (primary) hypertension: Secondary | ICD-10-CM | POA: Diagnosis not present

## 2020-01-18 DIAGNOSIS — K219 Gastro-esophageal reflux disease without esophagitis: Secondary | ICD-10-CM | POA: Diagnosis not present

## 2020-01-18 DIAGNOSIS — I2510 Atherosclerotic heart disease of native coronary artery without angina pectoris: Secondary | ICD-10-CM

## 2020-01-18 LAB — BAYER DCA HB A1C WAIVED: HB A1C (BAYER DCA - WAIVED): 7.5 % — ABNORMAL HIGH (ref <7.0)

## 2020-01-18 NOTE — Progress Notes (Signed)
BP 112/65   Pulse 91   Temp 98.1 F (36.7 C) (Temporal)   Ht 6' (1.829 m)   Wt 193 lb 6 oz (87.7 kg)   BMI 26.23 kg/m    Subjective:   Patient ID: Austin Graham, male    DOB: July 04, 1951, 69 y.o.   MRN: [id number]  HPI: Austin Graham is a 69 y.o. male presenting on 01/18/2020 for Medical Management of Chronic Issues   HPI Type 2 diabetes mellitus Patient comes in today for recheck of his diabetes. Patient has been currently taking [place] and Metformin and Rybelsus. Patient is currently on an ACE inhibitor/ARB. Patient has not seen an ophthalmologist this year. Patient denies any issues with their feet.  Patient has a better A1c at 7.5.  Hypertension Patient is currently on olmesartan, and their blood pressure today is olmesartan. Patient denies any lightheadedness or dizziness. Patient denies headaches, blurred vision, chest pains, shortness of breath, or weakness. Denies any side effects from medication and is content with current medication.   Hyperlipidemia Patient is coming in for recheck of his hyperlipidemia. The patient is currently taking Crestor and Vascepa. They deny any issues with myalgias or history of liver damage from it. They deny any focal numbness or weakness or chest pain.   GERD Patient is currently on no medication.  She denies any major symptoms or abdominal pain or belching or burping. She denies any blood in her stool or lightheadedness or dizziness.   Relevant past medical, surgical, family and social history reviewed and updated as indicated. Interim medical history since our last visit reviewed. Allergies and medications reviewed and updated.  Review of Systems  Constitutional: Negative for chills and fever.  Eyes: Negative for visual disturbance.  Respiratory: Negative for shortness of breath and wheezing.   Cardiovascular: Negative for chest pain and leg swelling.  Musculoskeletal: Negative for back pain and gait problem.  Skin: Negative for  rash.  Neurological: Negative for dizziness and light-headedness.  All other systems reviewed and are negative.   Per HPI unless specifically indicated above   Allergies as of 01/18/2020      Reactions   Codeine    REACTION: agitation   Erythromycin    REACTION: diarrhea   Latex    rash   Other    Melon- itchy throat      Medication List       Accurate as of January 18, 2020 10:00 AM. If you have any questions, ask your nurse or doctor.        STOP taking these medications   Janumet XR 50-1000 MG Tb24 Generic drug: SitaGLIPtin-MetFORMIN HCl Stopped by: Fransisca Kaufmann Henley Boettner, MD     TAKE these medications   aspirin 81 MG tablet Take by mouth daily.   Farxiga 10 MG Tabs tablet Generic drug: dapagliflozin propanediol Take 10 mg by mouth daily before breakfast.   ibuprofen 200 MG tablet Commonly known as: ADVIL Take 600 mg by mouth every 6 (six) hours as needed.   metFORMIN 500 MG 24 hr tablet Commonly known as: GLUCOPHAGE-XR Take 2 tablets (1,000 mg total) by mouth 2 (two) times daily with a meal.   olmesartan 20 MG tablet Commonly known as: BENICAR Take 0.5 tablets (10 mg total) by mouth daily.   ONE TOUCH ULTRA TEST test strip Generic drug: glucose blood CHECK BLOOD SUGAR DAILY AND AS NEEDED. E11.9   glucose blood test strip 1 each by Other route 2 (two) times daily. One touch   rosuvastatin  40 MG tablet Commonly known as: CRESTOR Take 1 tablet (40 mg total) by mouth daily.   Rybelsus 7 MG Tabs Generic drug: Semaglutide Take 1 tablet by mouth daily before breakfast.   Vascepa 1 g capsule Generic drug: icosapent Ethyl TAKE 2 CAPSULES (2 G TOTAL) BY MOUTH 2 (TWO) TIMES DAILY.   Vitamin D 50 MCG (2000 UT) Caps Take 1 capsule by mouth daily.   Vitamins/Minerals Tabs Take by mouth.        Objective:   BP 112/65   Pulse 91   Temp 98.1 F (36.7 C) (Temporal)   Ht 6' (1.829 m)   Wt 193 lb 6 oz (87.7 kg)   BMI 26.23 kg/m   Wt Readings from  Last 3 Encounters:  01/18/20 193 lb 6 oz (87.7 kg)  10/27/19 206 lb (93.4 kg)  10/13/19 208 lb (94.3 kg)    Physical Exam Vitals and nursing note reviewed.  Constitutional:      General: He is not in acute distress.    Appearance: He is well-developed. He is not diaphoretic.  Eyes:     General: No scleral icterus.    Conjunctiva/sclera: Conjunctivae normal.  Neck:     Thyroid: No thyromegaly.  Cardiovascular:     Rate and Rhythm: Normal rate and regular rhythm.     Heart sounds: Normal heart sounds. No murmur.  Pulmonary:     Effort: Pulmonary effort is normal. No respiratory distress.     Breath sounds: Normal breath sounds. No wheezing.  Musculoskeletal:        General: Normal range of motion.     Cervical back: Neck supple.  Lymphadenopathy:     Cervical: No cervical adenopathy.  Skin:    General: Skin is warm and dry.     Findings: No rash.  Neurological:     Mental Status: He is alert and oriented to person, place, and time.     Coordination: Coordination normal.  Psychiatric:        Behavior: Behavior normal.     Assessment & Plan:   Problem List Items Addressed This Visit      Cardiovascular and Mediastinum   Hypertension     Respiratory   Gastroesophageal reflux disease with hiatal hernia     Endocrine   Poorly controlled type 2 diabetes mellitus with circulatory disorder ([place])     Other   Hyperlipidemia    Other Visit Diagnoses    Type 2 diabetes mellitus with other specified complication, without long-term current use of insulin ([place])    -  Primary   Relevant Orders   Bayer DCA Hb A1c Waived (Completed)   Nontraumatic incomplete tear of right rotator cuff       Relevant Orders   Ambulatory referral to Orthopedic Surgery      Patient has been having problems with his right rotator cuff and pain, positive on exam for rotator cuff injury, will do referral to orthopedic.  With his diabetes and A1c 7.5, will try Rybelsus. Follow up plan: Return  in about 6 months (around 07/19/2020), or if symptoms worsen or fail to improve, for Diabetes and hypertension.  Counseling provided for all of the vaccine components Orders Placed This Encounter  Procedures  . [facility]ayer [facility] Hb A1c [place], MD [facility] 01/18/2020, 10:00 AM

## 2020-01-26 ENCOUNTER — Ambulatory Visit: Payer: BC Managed Care – PPO | Admitting: Internal Medicine

## 2020-01-31 LAB — COMPREHENSIVE METABOLIC PANEL
ALT: 22 IU/L (ref 0–44)
AST: 20 IU/L (ref 0–40)
Albumin/Globulin Ratio: 2.4 — ABNORMAL HIGH (ref 1.2–2.2)
Albumin: 4.8 g/dL (ref 3.8–4.8)
Alkaline Phosphatase: 90 IU/L (ref 39–117)
BUN/Creatinine Ratio: 22 (ref 10–24)
BUN: 24 mg/dL (ref 8–27)
Bilirubin Total: 0.6 mg/dL (ref 0.0–1.2)
CO2: 26 mmol/L (ref 20–29)
Calcium: 10.3 mg/dL — ABNORMAL HIGH (ref 8.6–10.2)
Chloride: 101 mmol/L (ref 96–106)
Creatinine, Ser: 1.1 mg/dL (ref 0.76–1.27)
GFR calc Af Amer: 80 mL/min/{1.73_m2} (ref >59)
GFR calc non Af Amer: 69 mL/min/{1.73_m2} (ref >59)
Globulin, Total: 2 g/dL (ref 1.5–4.5)
Glucose: 123 mg/dL — ABNORMAL HIGH (ref 65–99)
Potassium: 4.8 mmol/L (ref 3.5–5.2)
Sodium: 140 mmol/L (ref 134–144)
Total Protein: 6.8 g/dL (ref 6.0–8.5)

## 2020-01-31 LAB — LIPID PANEL
Chol/HDL Ratio: 3.2 ratio (ref 0.0–5.0)
Cholesterol, Total: 97 mg/dL — ABNORMAL LOW (ref 100–199)
HDL: 30 mg/dL — ABNORMAL LOW (ref >39)
LDL Chol Calc (NIH): 33 mg/dL (ref 0–99)
Triglycerides: 212 mg/dL — ABNORMAL HIGH (ref 0–149)
VLDL Cholesterol Cal: 34 mg/dL (ref 5–40)

## 2020-02-01 DIAGNOSIS — I2510 Atherosclerotic heart disease of native coronary artery without angina pectoris: Secondary | ICD-10-CM | POA: Insufficient documentation

## 2020-02-01 DIAGNOSIS — Z7189 Other specified counseling: Secondary | ICD-10-CM | POA: Insufficient documentation

## 2020-02-01 NOTE — Progress Notes (Signed)
Cardiology Office Note   Date:  02/02/2020   ID:  Austin Graham, DOB 10/02/1950, MRN [id number]  PCP:  Dettinger, Fransisca Kaufmann, MD  Cardiologist:   Minus Breeding, MD    Chief Complaint  Patient presents with  . Coronary Artery Disease      History of Present Illness: Austin Graham is a 69 y.o. male who presents for evaluation of chest discomfort. He was seen in 2009 for chest pain and had a treadmill test and I saw him a little bit more than 3 years ago when he had chest discomfort again. Stress testing was negative.  He returns for follow up.  He had a negative POET (Plain Old Exercise Treadmill) when I last saw him in 2017.  I saw him earlier this year. Because of shortness of breath and his cardiovascular risk factors I sent him for coronary CTA which demonstrated 25 to 49% right stenosis, 25 to 49% ramus intermedius stenosis and 50 to 70% LAD stenosis.  He has mid and distal LAD FFR were abnormal suggesting flow-limiting stenosis.  His coronary calcium score was 737 which was 83rd percentile in 2020.  Perfusion study in Jan was low risk.  At the last visit I referred him to endocrinology and changed him to Crestor.   He has been following with Dr. Cruzita Lederer and has had medication changes but when she went off on the semaglutide he felt that something he stopped his medicines really well and then per directions restarted them 1 at the time.  His sugar has dropped from previous but is still not at target.  His cholesterol and blood pressure are excellent.  He is trying to eat right and doing a pretty good job of this.  He is trying to be active.  He does have fatigue.  He is not describing chest pressure, neck or arm discomfort.  He is not having any new palpitations, presyncope or syncope.  He has had no weight gain or edema.   Past Medical History:  Diagnosis Date  . Arthritis   . Asthma    as child  . Complication of anesthesia    hard to waken  . Diabetes ([place])    type II  .  Diverticulosis   . Dysrhythmia    PAC's  . Goiter   . History of hiatal hernia   . Hyperlipidemia   . Hypertension   . Kidney stones   . Stress fracture    left knee, right hip  . Thyroid disease     Past Surgical History:  Procedure Laterality Date  . CYSTOSCOPY WITH INSERTION OF UROLIFT    . JOINT REPLACEMENT    . KIDNEY STONES  2006  . SKIN CANCER DESTRUCTION Left    deltoid- [facility]  . SKIN CANCER EXCISION Right 09/16/2018   shoulder   . TONSILLECTOMY    . 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   Procedure: RIGHT TOTAL HIP ARTHROPLASTY ANTERIOR APPROACH;  Surgeon: Gaynelle Arabian, MD;  Location: [facility];  Service: Orthopedics;  Laterality: Right;  . WISDOM TOOTH EXTRACTION       Current Outpatient Medications  Medication Sig Dispense Refill  . aspirin 81 MG tablet Take by mouth daily.     . Cholecalciferol (VITAMIN D) 2000 units CAPS Take 1 capsule by mouth daily.    . dapagliflozin propanediol (FARXIGA) 10 MG TABS tablet Take 10 mg by mouth daily before breakfast. 30 tablet 11  . glucose blood test strip 1 each by Other  route 2 (two) times daily. One touch 100 each 5  . ibuprofen (ADVIL) 200 MG tablet Take 600 mg by mouth every 6 (six) hours as needed.    . metFORMIN (GLUCOPHAGE-XR) 500 MG 24 hr tablet Take 2 tablets (1,000 mg total) by mouth 2 (two) times daily with a meal. 360 tablet 3  . olmesartan (BENICAR) 20 MG tablet Take 0.5 tablets (10 mg total) by mouth daily. 45 tablet 3  . ONE TOUCH ULTRA TEST test strip CHECK BLOOD SUGAR DAILY AND AS NEEDED. E11.9 100 each 3  . Semaglutide (RYBELSUS) 7 MG TABS Take 1 tablet by mouth daily before breakfast. 90 tablet 3  . VASCEPA 1 g capsule TAKE 2 CAPSULES (2 G TOTAL) BY MOUTH 2 (TWO) TIMES DAILY. 360 capsule 0  . Vitamins/Minerals TABS Take by mouth.    . rosuvastatin (CRESTOR) 40 MG tablet Take 1 tablet (40 mg total) by mouth daily. 90 tablet 3   No current facility-administered medications for this visit.    Allergies:    Codeine, Erythromycin, Latex, and Other    ROS:  Please see the history of present illness.   Otherwise, review of systems are positive for none.   All other systems are reviewed and negative.    PHYSICAL EXAM: VS:  BP 122/68   Pulse 85   Ht 6' (1.829 m)   Wt 193 lb 6.4 oz (87.7 kg)   SpO2 94%   BMI 26.23 kg/m  , BMI Body mass index is 26.23 kg/m. GENERAL:  Well appearing NECK:  No jugular venous distention, waveform within normal limits, carotid upstroke brisk and symmetric, no bruits, no thyromegaly LUNGS:  Clear to auscultation bilaterally CHEST:  Unremarkable HEART:  PMI not displaced or sustained,S1 and S2 within normal limits, no S3, no S4, no clicks, no rubs, no murmurs ABD:  Flat, positive bowel sounds normal in frequency in pitch, no bruits, no rebound, no guarding, no midline pulsatile mass, no hepatomegaly, no splenomegaly EXT:  2 plus pulses throughout, no edema, no cyanosis no clubbing   EKG:  EKG is ordered today. Sinus rhythm, rate 85, axis within normal limits, intervals within normal limits, no acute ST-T wave changes.  Recent Labs: 03/27/2019: Hemoglobin 14.6; Platelets 263 10/27/2019: TSH 1.95 01/31/2020: ALT 22; BUN 24; Creatinine, Ser 1.10; Potassium 4.8; Sodium 140    Lipid Panel    Component Value Date/Time   CHOL 97 (L) 01/31/2020 1135   CHOL 137 11/22/2013 1232   TRIG 212 (H) 01/31/2020 1135   TRIG 161 (H) 08/18/2016 1142   TRIG 192 (H) 11/22/2013 1232   HDL 30 (L) 01/31/2020 1135   HDL 38 (L) 08/18/2016 1142   HDL 33 (L) 11/22/2013 1232   CHOLHDL 3.2 01/31/2020 1135   LDLCALC 33 01/31/2020 1135   LDLCALC 86 07/04/2014 0955   LDLCALC 66 11/22/2013 1232      Wt Readings from Last 3 Encounters:  02/02/20 193 lb 6.4 oz (87.7 kg)  01/18/20 193 lb 6 oz (87.7 kg)  10/27/19 206 lb (93.4 kg)      Other studies Reviewed: Additional studies/ records that were reviewed today include:   Labs Review of the above records demonstrates: See  elsewhere   ASSESSMENT AND PLAN:  CAD:   I think he is pursuing optimal medical therapy and we had a long discussion about this.  He is doing better with lifestyle.  No further testing at this point.  He is aware to call me if he has any  future symptoms or questions about symptoms.  DM: I will defer to [place].   HTN: Blood pressure is well controlled.  No change in therapy.   DYSLIPIDEMIA:   His LDL is 33 with a total cholesterol of 97.  His triglycerides were elevated at 212.  We talked about this being managed in the context of treating his diabetes.  No change in therapy.  He tolerated the increased statin when I adjusted his Crestor at the last visit.  COVID EDUCATION: He has had his vaccine.   Current medicines are reviewed at length with the patient today.  The patient does not have concerns regarding medicines.  The following changes have been made:  As above.  Labs/ tests ordered today include:   Orders Placed This Encounter  Procedures  . EKG 12-Lead     Disposition:   FU with me as me after the perfusion study.      Signed, Minus Breeding, MD  02/02/2020 11:12 AM    [place]

## 2020-02-02 ENCOUNTER — Encounter: Payer: Self-pay | Admitting: Cardiology

## 2020-02-02 ENCOUNTER — Other Ambulatory Visit: Payer: Self-pay

## 2020-02-02 ENCOUNTER — Ambulatory Visit (INDEPENDENT_AMBULATORY_CARE_PROVIDER_SITE_OTHER): Payer: BC Managed Care – PPO | Admitting: Cardiology

## 2020-02-02 VITALS — BP 122/68 | HR 85 | Ht 72.0 in | Wt 193.4 lb

## 2020-02-02 DIAGNOSIS — E785 Hyperlipidemia, unspecified: Secondary | ICD-10-CM | POA: Diagnosis not present

## 2020-02-02 DIAGNOSIS — E118 Type 2 diabetes mellitus with unspecified complications: Secondary | ICD-10-CM

## 2020-02-02 DIAGNOSIS — I10 Essential (primary) hypertension: Secondary | ICD-10-CM | POA: Diagnosis not present

## 2020-02-02 DIAGNOSIS — I2510 Atherosclerotic heart disease of native coronary artery without angina pectoris: Secondary | ICD-10-CM

## 2020-02-02 DIAGNOSIS — Z7189 Other specified counseling: Secondary | ICD-10-CM

## 2020-02-02 NOTE — Patient Instructions (Signed)

## 2020-02-20 ENCOUNTER — Other Ambulatory Visit: Payer: Self-pay | Admitting: *Deleted

## 2020-02-20 MED ORDER — ONETOUCH ULTRASOFT LANCETS MISC: 3 refills | Status: DC

## 2020-03-05 ENCOUNTER — Other Ambulatory Visit: Payer: Self-pay | Admitting: Family Medicine

## 2020-07-11 ENCOUNTER — Other Ambulatory Visit: Payer: Self-pay | Admitting: Family Medicine

## 2020-07-19 ENCOUNTER — Other Ambulatory Visit: Payer: Self-pay

## 2020-07-19 ENCOUNTER — Encounter: Payer: Self-pay | Admitting: Family Medicine

## 2020-07-19 ENCOUNTER — Ambulatory Visit (INDEPENDENT_AMBULATORY_CARE_PROVIDER_SITE_OTHER): Payer: BC Managed Care – PPO | Admitting: Family Medicine

## 2020-07-19 VITALS — BP 129/78 | HR 85 | Temp 97.0°F | Ht 72.0 in | Wt 196.5 lb

## 2020-07-19 DIAGNOSIS — Z23 Encounter for immunization: Secondary | ICD-10-CM

## 2020-07-19 DIAGNOSIS — E782 Mixed hyperlipidemia: Secondary | ICD-10-CM

## 2020-07-19 DIAGNOSIS — I10 Essential (primary) hypertension: Secondary | ICD-10-CM

## 2020-07-19 DIAGNOSIS — E1169 Type 2 diabetes mellitus with other specified complication: Secondary | ICD-10-CM

## 2020-07-19 DIAGNOSIS — R351 Nocturia: Secondary | ICD-10-CM

## 2020-07-19 DIAGNOSIS — E1165 Type 2 diabetes mellitus with hyperglycemia: Secondary | ICD-10-CM

## 2020-07-19 DIAGNOSIS — E1159 Type 2 diabetes mellitus with other circulatory complications: Secondary | ICD-10-CM

## 2020-07-19 DIAGNOSIS — I2510 Atherosclerotic heart disease of native coronary artery without angina pectoris: Secondary | ICD-10-CM

## 2020-07-19 LAB — BAYER DCA HB A1C WAIVED: HB A1C (BAYER DCA - WAIVED): 8.7 % — ABNORMAL HIGH (ref <7.0)

## 2020-07-19 MED ORDER — OLMESARTAN MEDOXOMIL 20 MG PO TABS: 10.0000 mg | ORAL_TABLET | Freq: Every day | ORAL | 3 refills | Status: DC

## 2020-07-19 MED ORDER — TAMSULOSIN HCL 0.4 MG PO CAPS: 0.4000 mg | ORAL_CAPSULE | Freq: Every day | ORAL | 3 refills | Status: DC

## 2020-07-19 NOTE — Progress Notes (Signed)
BP 129/78   Pulse 85   Temp (!) 97 F (36.1 C)   Ht 6' (1.829 m)   Wt 196 lb 8 oz (89.1 kg)   SpO2 97%   BMI 26.65 kg/m    Subjective:   Patient ID: Austin Graham, male    DOB: [birth year], 69 y.o.   MRN: [id number]  HPI: Austin Graham is a 69 y.o. male presenting on 07/19/2020 for Medical Management of Chronic Issues and Diabetes   HPI Type 2 diabetes mellitus Patient comes in today for recheck of his diabetes. Patient has been currently taking Metformin and Rybelsus and [place]. Patient is currently on an ACE inhibitor/ARB. Patient has seen an ophthalmologist this year. Patient denies any issues with their feet. The symptom started onset as an adult hypertension and hyperlipidemia and CAD ARE RELATED TO DM   Hypertension Patient is currently on olmesartan, and their blood pressure today is 129/78. Patient denies any lightheadedness or dizziness. Patient denies headaches, blurred vision, chest pains, shortness of breath, or weakness. Denies any side effects from medication and is content with current medication.   Hyperlipidemia Patient is coming in for recheck of his hyperlipidemia. The patient is currently taking Crestor and Vascepa. They deny any issues with myalgias or history of liver damage from it. They deny any focal numbness or weakness or chest pain.   Relevant past medical, surgical, family and social history reviewed and updated as indicated. Interim medical history since our last visit reviewed. Allergies and medications reviewed and updated.  Review of Systems  Constitutional: Negative for chills and fever.  Eyes: Negative for visual disturbance.  Respiratory: Negative for shortness of breath and wheezing.   Cardiovascular: Negative for chest pain and leg swelling.  Musculoskeletal: Negative for back pain and gait problem.  Skin: Negative for rash.  Neurological: Negative for dizziness, weakness and light-headedness.  All other systems reviewed and are  negative.   Per HPI unless specifically indicated above   Allergies as of 07/19/2020      Reactions   Codeine    REACTION: agitation   Erythromycin    REACTION: diarrhea   Latex    rash   Other    Melon- itchy throat      Medication List       Accurate as of July 19, 2020 12:08 PM. If you have any questions, ask your nurse or doctor.        aspirin 81 MG tablet Take by mouth daily.   Farxiga 10 MG Tabs tablet Generic drug: dapagliflozin propanediol Take 10 mg by mouth daily before breakfast.   ibuprofen 200 MG tablet Commonly known as: ADVIL Take 600 mg by mouth every 6 (six) hours as needed.   metFORMIN 500 MG 24 hr tablet Commonly known as: GLUCOPHAGE-XR Take 2 tablets (1,000 mg total) by mouth 2 (two) times daily with a meal.   olmesartan 20 MG tablet Commonly known as: BENICAR Take 0.5 tablets (10 mg total) by mouth daily.   OneTouch Ultra test strip Generic drug: glucose blood Check BS twice daily Dx E11.59   onetouch ultrasoft lancets CHECK BLOOD SUGAR DAILY AND AS NEEDED. E11.9,   rosuvastatin 40 MG tablet Commonly known as: CRESTOR Take 1 tablet (40 mg total) by mouth daily.   Rybelsus 7 MG Tabs Generic drug: Semaglutide Take 1 tablet by mouth daily before breakfast.   Vascepa 1 g capsule Generic drug: icosapent Ethyl TAKE 2 CAPSULES (2 G TOTAL) BY MOUTH 2 (TWO) TIMES DAILY.  Vitamin D 50 MCG (2000 UT) Caps Take 1 capsule by mouth daily.   Vitamins/Minerals Tabs Take by mouth.        Objective:   BP 129/78   Pulse 85   Temp (!) 97 F (36.1 C)   Ht 6' (1.829 m)   Wt 196 lb 8 oz (89.1 kg)   SpO2 97%   BMI 26.65 kg/m   Wt Readings from Last 3 Encounters:  07/19/20 196 lb 8 oz (89.1 kg)  02/02/20 193 lb 6.4 oz (87.7 kg)  01/18/20 193 lb 6 oz (87.7 kg)    Physical Exam Vitals and nursing note reviewed.  Constitutional:      General: He is not in acute distress.    Appearance: He is well-developed. He is not  diaphoretic.  Eyes:     General: No scleral icterus.    Conjunctiva/sclera: Conjunctivae normal.  Neck:     Thyroid: No thyromegaly.  Cardiovascular:     Rate and Rhythm: Normal rate and regular rhythm.     Heart sounds: Normal heart sounds. No murmur heard.   Pulmonary:     Effort: Pulmonary effort is normal. No respiratory distress.     Breath sounds: Normal breath sounds. No wheezing.  Musculoskeletal:        General: Normal range of motion.     Cervical back: Neck supple.  Lymphadenopathy:     Cervical: No cervical adenopathy.  Skin:    General: Skin is warm and dry.     Findings: No rash.  Neurological:     Mental Status: He is alert and oriented to person, place, and time.     Coordination: Coordination normal.  Psychiatric:        Behavior: Behavior normal.       Assessment & Plan:   Problem List Items Addressed This Visit      Cardiovascular and Mediastinum   Hypertension   Relevant Medications   olmesartan (BENICAR) 20 MG tablet   Other Relevant Orders   CBC with Differential/Platelet (Completed)   CMP14+EGFR (Completed)   Lipid panel (Completed)     Endocrine   Poorly controlled type 2 diabetes mellitus with circulatory disorder (HCC)   Relevant Medications   olmesartan (BENICAR) 20 MG tablet   Other Relevant Orders   CBC with Differential/Platelet (Completed)   CMP14+EGFR (Completed)   Lipid panel (Completed)     Other   Hyperlipidemia   Relevant Medications   olmesartan (BENICAR) 20 MG tablet    Other Visit Diagnoses    Type 2 diabetes mellitus with other specified complication, without long-term current use of insulin (HCC)    -  Primary   Relevant Medications   olmesartan (BENICAR) 20 MG tablet   Other Relevant Orders   Bayer DCA Hb A1c Waived (Completed)   CBC with Differential/Platelet (Completed)   CMP14+EGFR (Completed)   Lipid panel (Completed)   Need for vaccination against Streptococcus pneumoniae using pneumococcal conjugate  vaccine 13       Relevant Orders   Pneumococcal conjugate vaccine 13-valent (Completed)   CBC with Differential/Platelet (Completed)   CMP14+EGFR (Completed)   Lipid panel (Completed)   Essential hypertension       Relevant Medications   olmesartan (BENICAR) 20 MG tablet   Nocturia       Relevant Medications   tamsulosin (FLOMAX) 0.4 MG CAPS capsule      Patient's A1c is elevated and will try and increase the Rybelsus to 14 again and see how he  does with it, did affect his stomach last time and he is really going to focus on diet, gave some education about what he could change. Follow up plan: Return in about 3 months (around 10/19/2020), or if symptoms worsen or fail to improve, for Diabetes recheck.  Counseling provided for all of the vaccine components Orders Placed This Encounter  Procedures  . Pneumococcal conjugate vaccine 13-valent  . Bayer DCA Hb A1c Waived  . CBC with Differential/Platelet  . CMP14+EGFR  . Lipid panel    Caryl Pina, MD [facility] 07/19/2020, 12:08 PM

## 2020-07-20 LAB — CBC WITH DIFFERENTIAL/PLATELET
Basophils Absolute: 0.1 10*3/uL (ref 0.0–0.2)
Basos: 1 %
EOS (ABSOLUTE): 0.1 10*3/uL (ref 0.0–0.4)
Eos: 2 %
Hematocrit: 42.2 % (ref 37.5–51.0)
Hemoglobin: 14.5 g/dL (ref 13.0–17.7)
Immature Grans (Abs): 0 10*3/uL (ref 0.0–0.1)
Immature Granulocytes: 0 %
Lymphocytes Absolute: 2.3 10*3/uL (ref 0.7–3.1)
Lymphs: 34 %
MCH: 30.7 pg (ref 26.6–33.0)
MCHC: 34.4 g/dL (ref 31.5–35.7)
MCV: 89 fL (ref 79–97)
Monocytes Absolute: 0.5 10*3/uL (ref 0.1–0.9)
Monocytes: 7 %
Neutrophils Absolute: 3.6 10*3/uL (ref 1.4–7.0)
Neutrophils: 56 %
Platelets: 225 10*3/uL (ref 150–450)
RBC: 4.72 x10E6/uL (ref 4.14–5.80)
RDW: 12.8 % (ref 11.6–15.4)
WBC: 6.6 10*3/uL (ref 3.4–10.8)

## 2020-07-20 LAB — CMP14+EGFR
ALT: 26 IU/L (ref 0–44)
AST: 19 IU/L (ref 0–40)
Albumin/Globulin Ratio: 2.6 — ABNORMAL HIGH (ref 1.2–2.2)
Albumin: 4.9 g/dL — ABNORMAL HIGH (ref 3.8–4.8)
Alkaline Phosphatase: 94 IU/L (ref 44–121)
BUN/Creatinine Ratio: 20 (ref 10–24)
BUN: 25 mg/dL (ref 8–27)
Bilirubin Total: 0.8 mg/dL (ref 0.0–1.2)
CO2: 24 mmol/L (ref 20–29)
Calcium: 9.8 mg/dL (ref 8.6–10.2)
Chloride: 106 mmol/L (ref 96–106)
Creatinine, Ser: 1.26 mg/dL (ref 0.76–1.27)
GFR calc Af Amer: 68 mL/min/{1.73_m2} (ref >59)
GFR calc non Af Amer: 59 mL/min/{1.73_m2} — ABNORMAL LOW (ref >59)
Globulin, Total: 1.9 g/dL (ref 1.5–4.5)
Glucose: 137 mg/dL — ABNORMAL HIGH (ref 65–99)
Potassium: 5.2 mmol/L (ref 3.5–5.2)
Sodium: 143 mmol/L (ref 134–144)
Total Protein: 6.8 g/dL (ref 6.0–8.5)

## 2020-07-20 LAB — LIPID PANEL
Chol/HDL Ratio: 3.7 ratio (ref 0.0–5.0)
Cholesterol, Total: 118 mg/dL (ref 100–199)
HDL: 32 mg/dL — ABNORMAL LOW (ref >39)
LDL Chol Calc (NIH): 57 mg/dL (ref 0–99)
Triglycerides: 171 mg/dL — ABNORMAL HIGH (ref 0–149)
VLDL Cholesterol Cal: 29 mg/dL (ref 5–40)

## 2020-07-24 ENCOUNTER — Other Ambulatory Visit: Payer: Self-pay | Admitting: Family Medicine

## 2020-09-06 LAB — HM DIABETES EYE EXAM

## 2020-10-18 ENCOUNTER — Other Ambulatory Visit: Payer: Self-pay | Admitting: Cardiology

## 2020-10-18 ENCOUNTER — Other Ambulatory Visit: Payer: Self-pay | Admitting: Internal Medicine

## 2020-10-24 ENCOUNTER — Other Ambulatory Visit: Payer: Self-pay | Admitting: Internal Medicine

## 2020-10-25 ENCOUNTER — Encounter: Payer: Self-pay | Admitting: Family Medicine

## 2020-10-25 ENCOUNTER — Ambulatory Visit: Payer: BC Managed Care – PPO | Admitting: Family Medicine

## 2020-10-31 ENCOUNTER — Other Ambulatory Visit: Payer: Self-pay | Admitting: Internal Medicine

## 2020-11-05 ENCOUNTER — Other Ambulatory Visit: Payer: Self-pay | Admitting: Family Medicine

## 2020-11-11 ENCOUNTER — Encounter: Payer: Self-pay | Admitting: Internal Medicine

## 2020-11-15 ENCOUNTER — Telehealth: Payer: Self-pay | Admitting: Internal Medicine

## 2020-11-15 DIAGNOSIS — E1165 Type 2 diabetes mellitus with hyperglycemia: Secondary | ICD-10-CM

## 2020-11-15 MED ORDER — RYBELSUS 7 MG PO TABS: 1.0000 | ORAL_TABLET | Freq: Every day | ORAL | 0 refills | Status: DC

## 2020-11-15 NOTE — Telephone Encounter (Signed)
Rx sent to preferred pharmacy.

## 2020-11-15 NOTE — Telephone Encounter (Signed)
MEDICATION: Rybelsus  PHARMACY:    [id number] - [place], [place] - [place] Phone:  [phone]  Fax:  [phone]     HAS THE PATIENT CONTACTED THEIR PHARMACY?  Yes  IS THIS A 90 DAY SUPPLY : no  IS PATIENT OUT OF MEDICATION: no  IF NOT; HOW MUCH IS LEFT: 1 more day worth  LAST APPOINTMENT DATE: @4 /30/2021  NEXT APPOINTMENT DATE:@4 /19/2022  OTHER COMMENTS:    **Let patient know to contact pharmacy at the end of the day to make sure medication is ready. **  ** Please notify patient to allow 48-72 hours to process**  **Encourage patient to contact the pharmacy for refills or they can request refills through [facility]**

## 2020-11-26 ENCOUNTER — Other Ambulatory Visit: Payer: Self-pay | Admitting: Internal Medicine

## 2020-12-16 ENCOUNTER — Ambulatory Visit (INDEPENDENT_AMBULATORY_CARE_PROVIDER_SITE_OTHER): Payer: BC Managed Care – PPO | Admitting: Family Medicine

## 2020-12-16 ENCOUNTER — Encounter: Payer: Self-pay | Admitting: Family Medicine

## 2020-12-16 DIAGNOSIS — J019 Acute sinusitis, unspecified: Secondary | ICD-10-CM | POA: Diagnosis not present

## 2020-12-16 MED ORDER — AMOXICILLIN 875 MG PO TABS: 875.0000 mg | ORAL_TABLET | Freq: Two times a day (BID) | ORAL | 0 refills | Status: DC

## 2020-12-16 NOTE — Progress Notes (Signed)
   Virtual Visit via telephone Note Due to COVID-19 pandemic this visit was conducted virtually. This visit type was conducted due to national recommendations for restrictions regarding the COVID-19 Pandemic (e.g. social distancing, sheltering in place) in an effort to limit this patient's exposure and mitigate transmission in our community. All issues noted in this document were discussed and addressed.  A physical exam was not performed with this format.  I connected with Austin Graham on 12/16/20 at [phone] by telephone and verified that I am speaking with the correct person using two identifiers. Austin Graham is currently located at home and his wife is currently with him during the visit. The provider, Gwenlyn Perking, FNP is located in their office at time of visit.  I discussed the limitations, risks, security and privacy concerns of performing an evaluation and management service by telephone and the availability of in person appointments. I also discussed with the patient that there may be a patient responsible charge related to this service. The patient expressed understanding and agreed to proceed.  CC: congestion  History and Present Illness:  HPI  Austin Graham reports head congestion for 2-3 weeks. He also reports headaches and frontal tenderness. He denies fever, body aches, chills, sore throat, cough, abdominal pain, nausea, vomiting, or diarrhea. He has been taking sudafed for his symptoms without improvement. He has had multiple negative home Covid tests. His wife has similar symptoms. He feels like his symptoms have been unchanged.     ROS As per HPI.   Observations/Objective: Alert and oriented x 3. Able to speak in full sentences without difficulty.    Assessment and Plan: [name] was seen today for nasal congestion.  Diagnoses and all orders for this visit:  Acute non-recurrent sinusitis, unspecified location Will treat with antibiotics given length of symptoms without  improvement despite OTC treatment. Can continue sudafed for congestin. Push fluids.  -     amoxicillin (AMOXIL) 875 MG tablet; Take 1 tablet (875 mg total) by mouth 2 (two) times daily.     Follow Up Instructions: Return to office for new or worsening symptoms, or if symptoms persist.     I discussed the assessment and treatment plan with the patient. The patient was provided an opportunity to ask questions and all were answered. The patient agreed with the plan and demonstrated an understanding of the instructions.   The patient was advised to call back or seek an in-person evaluation if the symptoms worsen or if the condition fails to improve as anticipated.  The above assessment and management plan was discussed with the patient. The patient verbalized understanding of and has agreed to the management plan. Patient is aware to call the clinic if symptoms persist or worsen. Patient is aware when to return to the clinic for a follow-up visit. Patient educated on when it is appropriate to go to the emergency department.   Time call ended:  0835  I provided 12 minutes of phone time during this encounter.    Gwenlyn Perking, FNP

## 2021-01-14 ENCOUNTER — Ambulatory Visit (INDEPENDENT_AMBULATORY_CARE_PROVIDER_SITE_OTHER): Payer: BC Managed Care – PPO | Admitting: Internal Medicine

## 2021-01-14 ENCOUNTER — Encounter: Payer: Self-pay | Admitting: Internal Medicine

## 2021-01-14 ENCOUNTER — Other Ambulatory Visit: Payer: Self-pay

## 2021-01-14 VITALS — BP 128/80 | HR 97 | Ht 72.0 in | Wt 195.8 lb

## 2021-01-14 DIAGNOSIS — E1165 Type 2 diabetes mellitus with hyperglycemia: Secondary | ICD-10-CM | POA: Diagnosis not present

## 2021-01-14 DIAGNOSIS — E1159 Type 2 diabetes mellitus with other circulatory complications: Secondary | ICD-10-CM | POA: Diagnosis not present

## 2021-01-14 DIAGNOSIS — E042 Nontoxic multinodular goiter: Secondary | ICD-10-CM

## 2021-01-14 LAB — POCT GLYCOSYLATED HEMOGLOBIN (HGB A1C): Hemoglobin A1C: 7 % — AB (ref 4.0–5.6)

## 2021-01-14 MED ORDER — RYBELSUS 14 MG PO TABS: 14.0000 mg | ORAL_TABLET | Freq: Every day | ORAL | 3 refills | Status: DC

## 2021-01-14 NOTE — Patient Instructions (Addendum)
Please continue: - Metformin ER 1000 mg 2x a day with meals - Farxiga 10 mg before b'fast  Please try to increase: - Rybelsus to 14 mg daily (try to alternate 7 with 14 mg every other week first)  Please stop at the lab.  Please schedule an appt with Antonieta Iba with nutrition.  Please return in 3 months with your sugar log.

## 2021-01-14 NOTE — Progress Notes (Signed)
Patient ID: Austin Graham, male   DOB: 07/22/1951, 70 y.o.   MRN: [id number]   This visit occurred during the SARS-CoV-2 public health emergency.  Safety protocols were in place, including screening questions prior to the visit, additional usage of staff PPE, and extensive cleaning of exam room while observing appropriate contact time as indicated for disinfecting solutions.   HPI: Austin Graham is a 70 y.o.-year-old male, initially referred by his cardiologist, Dr. Percival Spanish, returning for follow-up for DM2, dx in ~2013, non-insulin-dependent, uncontrolled, with complications (CAD, CKD) and also thyroid nodules.  Last visit 1 year and 3 months ago (!).  Interim history: I saw the patient first in 09/2019.  At that time, we stopped Janumet and started metformin ER, we increased Rybelsus dose and we added [place].  He developed diarrhea, indigestion, loss of appetite, nausea, after the visit, and he stopped all of his medicines.  The symptoms resolved and I advised him to start adding back the medicines one by one.  However, he was lost for follow-up afterwards. He keeps his 5 y/o grandson >> he is busy, cannot exercise. Had 3 steroid inj's in shoulder  - sugars higher then.  Reviewed latest HbA1c levels: Lab Results  Component Value Date   HGBA1C 8.7 (H) 07/19/2020   HGBA1C 7.5 (H) 01/18/2020   HGBA1C 8.9 (A) 10/27/2019   HGBA1C 7.5 (H) 07/07/2019   HGBA1C 7.2 (H) 03/27/2019   HGBA1C 9.1 (H) 11/11/2018   HGBA1C 9.3 (H) 08/09/2018   HGBA1C 7.5 (H) 02/10/2018   HGBA1C 9.7 (H) 10/29/2017   HGBA1C 9.3 (H) 01/15/2017   At last visit he was on: - JanuMet ER 50-1000 mg 2x a day, with meals - Rybelsus 3 mg daily before breakfast Tried Trulicity >> nausea, lightheadedness.  We changed to: - Metformin ER 1000 mg 2x a day with meals (L and D) - Rybelsus 7 mg daily - Farxiga 10 mg before b'fast  Pt checks his sugars 1-2x a day and they are: - am: 138, 140-190, 200 >> 120, 135-172, 207 -  2h after b'fast: 128-292 - before lunch: n/c >> 139, 180, 248 - 2h after lunch: 200-225 >> 122-268, 308 - before dinner: n/c - 2h after dinner: n/c >> 118, 134 - bedtime: 180-220 >> n/c - nighttime: n/c Lowest sugar was 138 >> 120; ? hypoglycemia awareness..  Highest sugar was 303 >> 308.  Glucometer: One Touch ultra  Pt's meals are: - Breakfast: oatmeal, cereals, granola - Lunch: sandwich on rye or whole wheat; bacon and eggs - Dinner: vegetable, potatoes, meat or pasta - Snacks: 2-3  - + mild CKD, last BUN/creatinine:  Lab Results  Component Value Date   BUN 25 07/19/2020   BUN 24 01/31/2020   CREATININE 1.26 07/19/2020   CREATININE 1.10 01/31/2020  On Benicar 20.  -+ HL; last set of lipids: Lab Results  Component Value Date   CHOL 118 07/19/2020   HDL 32 (L) 07/19/2020   LDLCALC 57 07/19/2020   TRIG 171 (H) 07/19/2020   CHOLHDL 3.7 07/19/2020  On Crestor 40, Vascepa.  - last eye exam was on September 06, 2020: No DR.   - no numbness and tingling in his feet. He has a foor sput - plantar fasciitis.  Pt has FH of DM in F, S, GM.  He also has a history of HTN, GERD, multinodular goiter.  Thyroid ultrasound (07/05/2012) showed very small nodules, stable: Right thyroid lobe:  5.0 x 1.7 x 2.3 cm. Left thyroid lobe:  5.6 x 1.9 x 2.4 cm. Isthmus:  Focal nodules:  Multiple bilateral small thyroid nodules.  The largest on the right is 10 mm in the lower pole compared 13 mm previously.  The largest on the left is 10 mm in the mid pole compared to 9 mm on the previous study.  No significant change.  No dominant nodule.  Lymphadenopathy:   None.  IMPRESSION: Numerous bilateral small thyroid nodules, none significantly changed.  He saw Dr. Dalbert Batman in the past and had FNAs:  Date Taken: 05/10/2008  Satisfactory for evaluation.   INTERPRETATION(S):  THYROID, LEFT, FINE NEEDLE ASPIRATION (THIN PREP, SMEARS BLOCK):  - FINDINGS CONSISTENT WITH NON-NEOPLASTIC  GOITER.  - Graham COMMENT.   Date Taken: 05/10/2008  Satisfactory for evaluation.   INTERPRETATION(S):  THYROID, RIGHT, FINE NEEDLE ASPIRATION (THIN PREP, SMEARS BLOCK):  - FINDINGS CONSISTENT WITH NON-NEOPLASTIC GOITER.  - Graham COMMENT.   Thyroid ultrasound (11/03/2019): Parenchymal Echotexture: Moderately heterogenous Isthmus: 0.4 cm thickness, stable Right lobe: 5.6 x 1.7 x 2.1 cm, previously 5.3 x 2.1 x 2.1 Left lobe: 4.9 x 2 x 1.9 cm, previously 5.4 x 2.2 x 1.7 _____________________________________________________________  Nodule # 1: Prior biopsy: No Location: Right; Inferior Maximum size: 0.98 cm; Other 2 dimensions: 0.8 x 0.8 cm, previously, 0.7 x 0.6 x 0.6 cm Composition: mixed cystic and solid (1) Echogenicity: hypoechoic (2) Significant change in size (>/= 20% in two dimensions and minimal increase of 2 mm): Yes *Given size (<1.4 cm) and appearance, this nodule does NOT meet TI-RADS criteria for biopsy or dedicated follow-up. ________________________________________________________  0.6 cm mixed solid/cystic inferior left nodule, previously 0.7; This nodule does NOT meet TI-RADS criteria for biopsy or dedicated follow-up.  Additional scattered hypoechoic lesions throughout both lobes all less than 0.5 cm diameter.  IMPRESSION: 1. Stable mild thyromegaly with scattered subcentimeter nodules. None meet criteria for biopsy or dedicated imaging follow-up.  Reviewed latest TSH: Lab Results  Component Value Date   TSH 1.95 10/27/2019   TSH 2.710 02/10/2018   TSH 3.820 04/01/2016   Pt denies: - feeling nodules in neck - hoarseness - dysphagia - choking - SOB with lying down  No FH of thyroid cancer. No h/o radiation tx to head or neck. +FH of thyroid ds in sister, [place]  He also has BPH with increased nocturia.  ROS: Constitutional: no weight gain, no weight loss, no fatigue, no subjective hyperthermia, no subjective hypothermia, + nocturia Eyes: no  blurry vision, no xerophthalmia ENT: no sore throat, + Graham HPI Cardiovascular: no CP, no SOB, no palpitations, no leg swelling Respiratory: no cough, no SOB, no wheezing Gastrointestinal: no N, no V, no D, no C, + occasional acid reflux Musculoskeletal: no muscle, + joint aches Skin: no rash, no hair loss Neurological: no tremors, no numbness or tingling/no dizziness/no HAs  I reviewed pt's medications, allergies, PMH, social hx, family hx, and changes were documented in the history of present illness. Otherwise, unchanged from my initial visit note.   Past Medical History:  Diagnosis Date  . Arthritis   . Asthma    as child  . Complication of anesthesia    hard to waken  . Diabetes ([place])    type II  . Diverticulosis   . Dysrhythmia    PAC's  . Goiter   . History of hiatal hernia   . Hyperlipidemia   . Hypertension   . Kidney stones   . Stress fracture    left knee, right hip  . Thyroid disease  Past Surgical History:  Procedure Laterality Date  . CYSTOSCOPY WITH INSERTION OF UROLIFT    . JOINT REPLACEMENT    . KIDNEY STONES  2006  . SKIN CANCER DESTRUCTION Left    deltoid- [facility]  . SKIN CANCER EXCISION Right 09/16/2018   shoulder   . TONSILLECTOMY    . 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   Procedure: RIGHT TOTAL HIP ARTHROPLASTY ANTERIOR APPROACH;  Surgeon: Gaynelle Arabian, MD;  Location: [facility];  Service: Orthopedics;  Laterality: Right;  . WISDOM TOOTH EXTRACTION     Social History   Socioeconomic History  . Marital status: Married    Spouse name: Not on file  . Number of children: 3  . Years of education: Not on file  . Highest education level: Not on file  Occupational History    Employer:  Funeral director's assistant  Tobacco Use  . Smoking status: Former Smoker    Quit date: 11/02/1974    Years since quitting: 45.0  . Smokeless tobacco: Never Used  Substance and Sexual Activity  . Alcohol use: Yes    Alcohol/week: 1.0 - 2.0 standard drinks     Types: 1 - 2 Standard drinks or equivalent per week    Comment: 2-3 PER WEEK  . Drug use: no    Types:   . Sexual activity: Not on file  Other Topics Concern  . Not on file  Social History Narrative   Lives at home with wife.     Social Determinants of Health   Financial Resource Strain:   . Difficulty of Paying Living Expenses: Not on file  Food Insecurity:   . Worried About Charity fundraiser in the Last Year: Not on file  . Ran Out of Food in the Last Year: Not on file  Transportation Needs:   . Lack of Transportation (Medical): Not on file  . Lack of Transportation (Non-Medical): Not on file  Physical Activity:   . Days of Exercise per Week: Not on file  . Minutes of Exercise per Session: Not on file  Stress:   . Feeling of Stress : Not on file  Social Connections:   . Frequency of Communication with Friends and Family: Not on file  . Frequency of Social Gatherings with Friends and Family: Not on file  . Attends Religious Services: Not on file  . Active Member of Clubs or Organizations: Not on file  . Attends Archivist Meetings: Not on file  . Marital Status: Not on file  Intimate Partner Violence:   . Fear of Current or Ex-Partner: Not on file  . Emotionally Abused: Not on file  . Physically Abused: Not on file  . Sexually Abused: Not on file   Current Outpatient Medications on File Prior to Visit  Medication Sig Dispense Refill  . amoxicillin (AMOXIL) 875 MG tablet Take 1 tablet (875 mg total) by mouth 2 (two) times daily. 20 tablet 0  . aspirin 81 MG tablet Take by mouth daily.     . Cholecalciferol (VITAMIN D) 2000 units CAPS Take 1 capsule by mouth daily.    Marland Kitchen FARXIGA 10 MG TABS tablet TAKE 10 MG BY MOUTH DAILY BEFORE BREAKFAST. 60 tablet 0  . glucose blood (ONETOUCH ULTRA) test strip Check BS twice daily Dx E11.59 200 strip 3  . ibuprofen (ADVIL) 200 MG tablet Take 600 mg by mouth every 6 (six) hours as needed.    . Lancets (ONETOUCH ULTRASOFT)  lancets CHECK BLOOD SUGAR DAILY AND  AS NEEDED. E11.9, 100 each 3  . metFORMIN (GLUCOPHAGE-XR) 500 MG 24 hr tablet TAKE 2 TABLETS (1,000 MG TOTAL) BY MOUTH 2 (TWO) TIMES DAILY WITH A MEAL. 360 tablet 3  . olmesartan (BENICAR) 20 MG tablet Take 0.5 tablets (10 mg total) by mouth daily. 45 tablet 3  . rosuvastatin (CRESTOR) 40 MG tablet TAKE 1 TABLET BY MOUTH EVERY DAY 90 tablet 3  . Semaglutide (RYBELSUS) 7 MG TABS Take 1 tablet by mouth daily before breakfast. 90 tablet 0  . tamsulosin (FLOMAX) 0.4 MG CAPS capsule Take 1 capsule (0.4 mg total) by mouth daily. 90 capsule 3  . VASCEPA 1 g capsule TAKE 2 CAPSULES (2 G TOTAL) BY MOUTH 2 (TWO) TIMES DAILY. 120 capsule 5  . Vitamins/Minerals TABS Take by mouth.     No current facility-administered medications on file prior to visit.   Allergies  Allergen Reactions  . Codeine     REACTION: agitation  . Erythromycin     REACTION: diarrhea  . Latex     rash  . Other     Melon- itchy throat   Family History  Problem Relation Age of Onset  . Cancer Sister        ovarian  . Diabetes Sister   . Thyroid disease Sister   . Breast cancer Mother 74  . Hypertension Mother   . Cancer Mother   . CAD Father 77       Died age 81 after CABG  . Stroke Father   . Hypertension Father   . AAA (abdominal aortic aneurysm) Father   . Hyperlipidemia Father   . Heart disease Father     PE: BP 128/80 (BP Location: Right Arm, Patient Position: Sitting, Cuff Size: Normal)   Pulse 97   Ht 6' (1.829 m)   Wt 195 lb 12.8 oz (88.8 kg)   SpO2 97%   BMI 26.56 kg/m  Wt Readings from Last 3 Encounters:  01/14/21 195 lb 12.8 oz (88.8 kg)  07/19/20 196 lb 8 oz (89.1 kg)  02/02/20 193 lb 6.4 oz (87.7 kg)   Constitutional: overweight, in NAD Eyes: PERRLA, EOMI, no exophthalmos ENT: moist mucous membranes, no thyromegaly, no cervical lymphadenopathy Cardiovascular: RRR, No MRG Respiratory: CTA B Gastrointestinal: abdomen soft, NT, ND, BS+ Musculoskeletal:  no deformities, strength intact in all 4 Skin: moist, warm, no rashes Neurological: no tremor with outstretched hands, DTR normal in all 4  ASSESSMENT: 1. DM2, non-insulin-dependent, uncontrolled, with complications - CAD - CKD  2. MNG  PLAN:  1. Patient with longstanding, uncontrolled, type 2 diabetes, on oral antidiabetic regimen, returning after a long absence, close 1 year and 3 months.  At last visit, HbA1c was 8.9%, increased.  At that time, we discussed about improving his diet and I made specific recommendations including stopping diet sodas.  We also increased his Rybelsus dose, switch from Janumet to metformin ER and added [place].  He wanted to avoid injections at that time.  He did try Trulicity in the past and had some nausea and dizziness from it. -After the last visit, he contacted me about having GI symptoms and he stopped all of his medicines.  Symptoms improved so I advised him to add the medicines back one by one.  However, he was lost for follow-up afterwards.  Latest HbA1c available for review is from 06/2020 and this was only slightly lower, at 8.7%. -At this visit, reviewing his blood sugars at home, they are quite fluctuating, reflecting possible insulin  deficiency.  Sugars may increase up to upper 200s and even 300s after a meal.  Sugars in the morning are also above goal.  We did discuss that I would recommend basal insulin for now, but we decided to check his insulin production and pancreatic autoimmunity first. -He describes still occasional nausea with Rybelsus but he is willing to try to increase the dose, especially since I explained that Rybelsus can stimulate insulin production.  We will initially try to alternate 7 with 40 mg every other week and then hopefully continue with 40 mg of Rybelsus daily.  We will continue Farxiga for now and also metformin.  He tolerates this well.  He does have increased urination, and nocturia up to 3 times at night, which is not new. -  I also suggested a referral to nutrition and he agrees with this. - I suggested to:  Patient Instructions  Please continue: - Metformin ER 1000 mg 2x a day with meals - Farxiga 10 mg before b'fast  Please try to increase: - Rybelsus to 14 mg daily (try to alternate 7 with 14 mg every other week first)  Please stop at the lab.  Please schedule an appt with Antonieta Iba with nutrition.  Please return in 3 months with your sugar log.     - we checked his HbA1c: 7.0% (however, this is lower than expected from his log) - advised to check sugars at different times of the day - 1x a day, rotating check times - advised for yearly eye exams >> he is UTD - return to clinic in 3-4 months  2. MNG -He denies neck compression symptoms -Previously investigated with ultrasound (in 2013-reviewed) and also FNA (in 2009-reviewed) and these were benign. -He had another thyroid ultrasound obtained after last visit (11/03/2019) and he only had 1 nodule measuring slightly less than 1 cm, the rest of the nodules being smaller.   -Last TSH was normal: Lab Results  Component Value Date   TSH 1.95 10/27/2019  -No further follow-up is recommended.  Orders Placed This Encounter  Procedures  . Glucose, fasting  . C-peptide  . Anti-islet cell antibody  . ZNT8 Antibodies  . Glutamic acid decarboxylase auto abs  . Amb ref to Medical Nutrition Therapy-MNT  . POCT glycosylated hemoglobin (Hb A1C)   Component     Latest Ref Rng & Units 01/14/2021  Glucose     65 - 99 mg/dL 122 (H)  Hemoglobin A1C     4.0 - 5.6 % 7.0 (A)  C-Peptide     0.80 - 3.85 ng/mL 2.54  Islet Cell Ab     Neg:<1:1 Negative  ZNT8 Antibodies     <15 U/mL <10  Glutamic Acid Decarb Ab     <5 IU/mL <5  No signs of insulin deficiency or pancreatic autoimmunity.  Philemon Kingdom, MD PhD [facility]

## 2021-01-15 LAB — ANTI-ISLET CELL ANTIBODY: Islet Cell Ab: NEGATIVE

## 2021-01-20 ENCOUNTER — Other Ambulatory Visit: Payer: Self-pay | Admitting: Internal Medicine

## 2021-01-22 LAB — GLUTAMIC ACID DECARBOXYLASE AUTO ABS: Glutamic Acid Decarb Ab: <5 IU/mL (ref <5)

## 2021-01-22 LAB — ZNT8 ANTIBODIES: ZNT8 Antibodies: <10 U/mL (ref <15)

## 2021-01-22 LAB — C-PEPTIDE: C-Peptide: 2.54 ng/mL (ref 0.80–3.85)

## 2021-01-22 LAB — GLUCOSE, FASTING: Glucose, Bld: 122 mg/dL — ABNORMAL HIGH (ref 65–99)

## 2021-02-11 ENCOUNTER — Other Ambulatory Visit: Payer: Self-pay

## 2021-02-11 ENCOUNTER — Encounter: Payer: BC Managed Care – PPO | Attending: Internal Medicine | Admitting: Nutrition

## 2021-02-11 DIAGNOSIS — E1159 Type 2 diabetes mellitus with other circulatory complications: Secondary | ICD-10-CM | POA: Insufficient documentation

## 2021-02-11 DIAGNOSIS — E1165 Type 2 diabetes mellitus with hyperglycemia: Secondary | ICD-10-CM | POA: Insufficient documentation

## 2021-02-12 NOTE — Patient Instructions (Addendum)
Stop cold cereal and milk and fruit for breakfast, or switch to 1% milk with no fruit and nuts.  Test blood sugar in 2 hours, and if less than 180mg , you can have this. Walk for 30 min. 4-5 days /wk. Test blood sugar fasting and 2hr. After one meal per day-alternating meals each day Call if 2hr. After meals reading is above 180 on a consistent basis

## 2021-02-12 NOTE — Progress Notes (Signed)
Pt. Is here to review diet and discuss his diabetes.  He is using a One Touch Ultra meter and testing 2 hr. pc with readings ranging from 150-200.  They have been improving in the last week Diabetes Medtications: .Rybelsus 74m/14 alternating each week,   Metformin XR 5047m Qd Says that the mention of insulin, has scared him, and he is now watching his diet better.  He drinks no sweet drinks, but diet is not balanced, and high in fruit.  Weight is down 5 pounds from seeing Dr. GhCruzita Ledererne month ago.   Typical day: 7AM: up  Takes medicines  8:00AM  Bfast of Honey nut Chereos with banana once a week or other fruits.  Says 2hr. Pc start of the meal is usually less than 180,  Milk is skim.  Drinks water 10:00AM:  Fruit  12:30-1:30 Lunch of sandwich of whole wheat bread with mayo and handful of almonds.  Water to drink 4PM: snack of 1-2 servings of fruit, or nuts. 7:30-8PM: supper:  5-6 ounces of protein like hamburger, or chicken.  2 veg. Link Mac and cheese, broccoli with cheese, and corn or green beans.  Has sweet potato once a week, usually 1/2 with butter and brown sugar with cinnamon.  Water to dink 9PM:  Snack of 21 almonds Exercise:  Little.  Used to golf, or walk Suggestions given: 1.  New One Touch meter: verio.  Discussed that this one is more accurate.  Times to test:  Fasting (goal less than 110), and 2hr. PC: (goal less than 160). 2.  Test blood sugar 2hr. Pc cereal and fruit.  If over 180, stop this and switch to sheet olther alternatives of list given to him.   3.  Discussed why his blood sugars are running high and the course of diabetes and insulin resistance to insulin reduction.  He reported good understanding of this.  Also discussed ways to decrease insulin resistance with waist loss size and exercise.  Goal for weight loss is 3 more pounds:  Weight of 187 will put him at upper limits of non obese.  He agreed to this and need for walking for 30 min. Total each day.   4.  Discussed  need to reduce fruits to no more than 3 per day.  He was concerned that these were lowest calorie choices.  List given for snacks that are less than 100 calories to include protein with less than 8 grams of carb.  He agreed to try these suggestions. 5.  Discussed importance of balancing meals to prevent being very sleepy after  Breakfast and not getting hungry 2 hr.pc.  He agreed to this and reported good understanding of this.   6.  2000 calorie meal plan given to him with servng of each meal afternoon and HS snack.  Reviewed this and he reported good understanding of this.   He had no final questions.

## 2021-02-13 ENCOUNTER — Other Ambulatory Visit: Payer: Self-pay | Admitting: Internal Medicine

## 2021-02-13 DIAGNOSIS — E1159 Type 2 diabetes mellitus with other circulatory complications: Secondary | ICD-10-CM

## 2021-02-13 DIAGNOSIS — E1165 Type 2 diabetes mellitus with hyperglycemia: Secondary | ICD-10-CM

## 2021-03-05 ENCOUNTER — Telehealth: Payer: Self-pay | Admitting: Nutrition

## 2021-03-05 NOTE — Telephone Encounter (Signed)
I gave him a new meter and he is needing new test strips  Please call in a prescription for Verio test stips to his [facility] in [place].  He is testing 3X/day

## 2021-04-02 NOTE — Progress Notes (Signed)
Cardiology Office Note   Date:  04/03/2021   ID:  Austin Graham, DOB [birth year], MRN [id number]  PCP:  Dettinger, Fransisca Kaufmann, MD  Cardiologist:   Minus Breeding, MD    Chief Complaint  Patient presents with   Coronary Artery Disease       History of Present Illness: Austin Graham is a 70 y.o. male who presents for evaluation of chest discomfort. He was seen in 2009 for chest pain and had a treadmill test and I saw him a little bit more than 3 years ago when he had chest discomfort again. Stress testing was negative.  He returns for follow up.  He had a negative POET (Plain Old Exercise Treadmill) when I last saw him in 2017.  I saw him earlier this year. Because of shortness of breath and his cardiovascular risk factors I sent him for coronary CTA which demonstrated 25 to 49% right stenosis, 25 to 49% ramus intermedius stenosis and 50 to 70% LAD stenosis.  He has mid and distal LAD FFR were abnormal suggesting flow-limiting stenosis.  His coronary calcium score was 737 which was 83rd percentile in 2020.   At the last visit I referred him to endocrinology and changed him to Crestor.   He had a low risk perfusion study in 2021.    Since I last saw him he has done well.  He has lost about 15 to 20 pounds.  He is a little bit limited in his activities because of hip pain.  He did have COVID he said severely though he was not hospitalized.  The patient denies any new symptoms such as chest discomfort, neck or arm discomfort. There has been no new shortness of breath, PND or orthopnea. There have been no reported palpitations, presyncope or syncope.    Past Medical History:  Diagnosis Date   Arthritis    Asthma    as child   Complication of anesthesia    hard to waken   Diabetes ([place])    type II   Diverticulosis    Dysrhythmia    PAC's   Goiter    History of hiatal hernia    Hyperlipidemia    Hypertension    Kidney stones    Stress fracture    left knee, right hip    Thyroid disease     Past Surgical History:  Procedure Laterality Date   CYSTOSCOPY WITH INSERTION OF UROLIFT     JOINT REPLACEMENT     KIDNEY STONES  2006   SKIN CANCER DESTRUCTION Left    deltoid- [facility]   SKIN CANCER EXCISION Right 09/16/2018   shoulder    TONSILLECTOMY     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   Procedure: RIGHT TOTAL HIP ARTHROPLASTY ANTERIOR APPROACH;  Surgeon: Gaynelle Arabian, MD;  Location: [facility];  Service: Orthopedics;  Laterality: Right;   WISDOM TOOTH EXTRACTION       Current Outpatient Medications  Medication Sig Dispense Refill   aspirin 81 MG tablet Take by mouth daily.      Cholecalciferol (VITAMIN D) 2000 units CAPS Take 1 capsule by mouth daily.     FARXIGA 10 MG TABS tablet TAKE 1 TABLET DAILY BEFORE BREAKFAST 90 tablet 1   glucose blood (ONETOUCH ULTRA) test strip Check BS twice daily Dx E11.59 200 strip 3   ibuprofen (ADVIL) 200 MG tablet Take 600 mg by mouth every 6 (six) hours as needed.     Lancets (ONETOUCH ULTRASOFT) lancets CHECK BLOOD SUGAR  DAILY AND AS NEEDED. E11.9, 100 each 3   metFORMIN (GLUCOPHAGE-XR) 500 MG 24 hr tablet TAKE 2 TABLETS (1,000 MG TOTAL) BY MOUTH 2 (TWO) TIMES DAILY WITH A MEAL. 360 tablet 3   olmesartan (BENICAR) 20 MG tablet Take 0.5 tablets (10 mg total) by mouth daily. 45 tablet 3   rosuvastatin (CRESTOR) 40 MG tablet TAKE 1 TABLET BY MOUTH EVERY DAY 90 tablet 3   Semaglutide (RYBELSUS) 14 MG TABS Take 14 mg by mouth daily. 90 tablet 3   VASCEPA 1 g capsule TAKE 2 CAPSULES (2 G TOTAL) BY MOUTH 2 (TWO) TIMES DAILY. 120 capsule 5   Vitamins/Minerals TABS Take by mouth.     No current facility-administered medications for this visit.    Allergies:   Codeine, Erythromycin, Latex, and Other    ROS:  Please see the history of present illness.   Otherwise, review of systems are positive for none.   All other systems are reviewed and negative.    PHYSICAL EXAM: VS:  BP 116/68   Pulse 85   Ht 6' (1.829 m)   Wt 186  lb (84.4 kg)   SpO2 97%   BMI 25.23 kg/m  , BMI Body mass index is 25.23 kg/m. GENERAL:  Well appearing NECK:  No jugular venous distention, waveform within normal limits, carotid upstroke brisk and symmetric, no bruits, no thyromegaly LUNGS:  Clear to auscultation bilaterally CHEST:  Unremarkable HEART:  PMI not displaced or sustained,S1 and S2 within normal limits, no S3, no S4, no clicks, no rubs, no murmurs ABD:  Flat, positive bowel sounds normal in frequency in pitch, no bruits, no rebound, no guarding, no midline pulsatile mass, no hepatomegaly, no splenomegaly EXT:  2 plus pulses throughout, no edema, no cyanosis no clubbing  EKG:  EKG is  ordered today. Sinus rhythm, rate 85, axis within normal limits, intervals within normal limits, no acute ST-T wave changes.  Recent Labs: 07/19/2020: ALT 26; BUN 25; Creatinine, Ser 1.26; Hemoglobin 14.5; Platelets 225; Potassium 5.2; Sodium 143    Lipid Panel    Component Value Date/Time   CHOL 118 07/19/2020 1237   CHOL 137 11/22/2013 1232   TRIG 171 (H) 07/19/2020 1237   TRIG 161 (H) 08/18/2016 1142   TRIG 192 (H) 11/22/2013 1232   HDL 32 (L) 07/19/2020 1237   HDL 38 (L) 08/18/2016 1142   HDL 33 (L) 11/22/2013 1232   CHOLHDL 3.7 07/19/2020 1237   LDLCALC 57 07/19/2020 1237   LDLCALC 86 07/04/2014 0955   LDLCALC 66 11/22/2013 1232      Wt Readings from Last 3 Encounters:  04/03/21 186 lb (84.4 kg)  02/12/21 190 lb 6.4 oz (86.4 kg)  01/14/21 195 lb 12.8 oz (88.8 kg)      Other studies Reviewed: Additional studies/ records that were reviewed today include:   Labs Review of the above records demonstrates: See elsewhere   ASSESSMENT AND PLAN:  CAD:    He had a negative perfusion study in 2021.  He has had no further symptoms.  We are continuing with aggressive risk reduction.  No change in therapy.  DM:    I will check an A1c today.  He sees Dr. Cruzita Lederer tomorrow.  His A1c had come down nicely to the most recent of  7.0.  HTN: Blood pressure is well controlled.  No change in therapy.   DYSLIPIDEMIA:   His LDL is 57.  No change in therapy.     Current medicines are reviewed  at length with the patient today.  The patient does not have concerns regarding medicines.  The following changes have been made:  None Labs/ tests ordered today include:   Orders Placed This Encounter  Procedures   Lipid panel   HgB A1c   EKG 12-Lead      Disposition:   FU with me as in 12 months.       Signed, Minus Breeding, MD  04/03/2021 12:43 PM    [facility]

## 2021-04-03 ENCOUNTER — Ambulatory Visit (INDEPENDENT_AMBULATORY_CARE_PROVIDER_SITE_OTHER): Payer: BC Managed Care – PPO | Admitting: Cardiology

## 2021-04-03 ENCOUNTER — Other Ambulatory Visit: Payer: Self-pay

## 2021-04-03 ENCOUNTER — Encounter: Payer: Self-pay | Admitting: Cardiology

## 2021-04-03 VITALS — BP 116/68 | HR 85 | Ht 72.0 in | Wt 186.0 lb

## 2021-04-03 DIAGNOSIS — I2510 Atherosclerotic heart disease of native coronary artery without angina pectoris: Secondary | ICD-10-CM | POA: Diagnosis not present

## 2021-04-03 DIAGNOSIS — E785 Hyperlipidemia, unspecified: Secondary | ICD-10-CM

## 2021-04-03 DIAGNOSIS — E118 Type 2 diabetes mellitus with unspecified complications: Secondary | ICD-10-CM | POA: Diagnosis not present

## 2021-04-03 NOTE — Patient Instructions (Signed)
Medication Instructions:  Your physician recommends that you continue on your current medications as directed. Please refer to the Current Medication list given to you today.   *If you need a refill on your cardiac medications before your next appointment, please call your pharmacy*  Lab Work: A1C/LIPID PANEL   If you have labs (blood work) drawn today and your tests are completely normal, you will receive your results only by: [facility] (if you have MyChart) OR A paper copy in the mail If you have any lab test that is abnormal or we need to change your treatment, we will call you to review the results.  Testing/Procedures: NONE   Follow-Up: At [facility], you and your health needs are our priority.  As part of our continuing mission to provide you with exceptional heart care, we have created designated Provider Care Teams.  These Care Teams include your primary Cardiologist (physician) and Advanced Practice Providers (APPs -  Physician Assistants and Nurse Practitioners) who all work together to provide you with the care you need, when you need it.  We recommend signing up for the patient portal called "MyChart".  Sign up information is provided on this After Visit Summary.  MyChart is used to connect with patients for Virtual Visits (Telemedicine).  Patients are able to view lab/test results, encounter notes, upcoming appointments, etc.  Non-urgent messages can be sent to your provider as well.   To learn more about what you can do with MyChart, go to [email or web address].    Your next appointment:   12 month(s)  The format for your next appointment:   In Person  Provider:   You may see DR [facility]   or one of the following Advanced Practice Providers on your designated Care Team:   Rosaria Ferries, PA-C Jory Sims, DNP, ANP

## 2021-04-04 ENCOUNTER — Ambulatory Visit (INDEPENDENT_AMBULATORY_CARE_PROVIDER_SITE_OTHER): Payer: BC Managed Care – PPO | Admitting: Internal Medicine

## 2021-04-04 ENCOUNTER — Encounter: Payer: Self-pay | Admitting: Internal Medicine

## 2021-04-04 VITALS — BP 110/68 | HR 101 | Ht 72.0 in | Wt 186.6 lb

## 2021-04-04 DIAGNOSIS — E782 Mixed hyperlipidemia: Secondary | ICD-10-CM

## 2021-04-04 DIAGNOSIS — E042 Nontoxic multinodular goiter: Secondary | ICD-10-CM | POA: Diagnosis not present

## 2021-04-04 DIAGNOSIS — E1165 Type 2 diabetes mellitus with hyperglycemia: Secondary | ICD-10-CM | POA: Diagnosis not present

## 2021-04-04 DIAGNOSIS — E1159 Type 2 diabetes mellitus with other circulatory complications: Secondary | ICD-10-CM

## 2021-04-04 LAB — HEMOGLOBIN A1C
Est. average glucose Bld gHb Est-mCnc: 146 mg/dL
Hgb A1c MFr Bld: 6.7 % — ABNORMAL HIGH (ref 4.8–5.6)

## 2021-04-04 LAB — LIPID PANEL
Chol/HDL Ratio: 3.4 ratio (ref 0.0–5.0)
Cholesterol, Total: 91 mg/dL — ABNORMAL LOW (ref 100–199)
HDL: 27 mg/dL — ABNORMAL LOW (ref >39)
LDL Chol Calc (NIH): 41 mg/dL (ref 0–99)
Triglycerides: 125 mg/dL (ref 0–149)
VLDL Cholesterol Cal: 23 mg/dL (ref 5–40)

## 2021-04-04 MED ORDER — GLUCOSE BLOOD VI STRP: ORAL_STRIP | 3 refills | Status: DC

## 2021-04-04 MED ORDER — ONETOUCH DELICA LANCETS 33G MISC: 3 refills | Status: AC

## 2021-04-04 NOTE — Progress Notes (Signed)
Patient ID: Austin Graham, male   DOB: Jul 15, 1951, 70 y.o.   MRN: [id number]   This visit occurred during the SARS-CoV-2 public health emergency.  Safety protocols were in place, including screening questions prior to the visit, additional usage of staff PPE, and extensive cleaning of exam room while observing appropriate contact time as indicated for disinfecting solutions.   HPI: Austin Graham is a 70 y.o.-year-old male, initially referred by his cardiologist, Dr. Percival Spanish, returning for follow-up for DM2, dx in ~2013, non-insulin-dependent, uncontrolled, with complications (CAD, CKD) and also thyroid nodules.Last visit 3 months ago.  I saw the patient first in 09/2019.  At that time, we stopped Janumet and started metformin ER, we increased Rybelsus dose and we added [place].  He developed diarrhea, indigestion, loss of appetite, nausea, after the visit, and he stopped all of his medicines.  The symptoms resolved and I advised him to start adding back the medicines one by one.  However, he was lost for follow-up afterwards.  Interim history: At last visit, he was back on metformin ER, Farxiga, and also Rybelsus.  Sugars were high after he  had steroid injections in his shoulder.  We increased the Rybelsus dose. He and his wife had Covid19 3 weeks ago.  He felt really poorly, but then recovered completely. He changed his diet since last OV >> lost ~10 lbs.  Reviewed latest HbA1c levels: Lab Results  Component Value Date   HGBA1C 6.7 (H) 04/03/2021   HGBA1C 7.0 (A) 01/14/2021   HGBA1C 8.7 (H) 07/19/2020   HGBA1C 7.5 (H) 01/18/2020   HGBA1C 8.9 (A) 10/27/2019   HGBA1C 7.5 (H) 07/07/2019   HGBA1C 7.2 (H) 03/27/2019   HGBA1C 9.1 (H) 11/11/2018   HGBA1C 9.3 (H) 08/09/2018   HGBA1C 7.5 (H) 02/10/2018   At last visit he was on: - JanuMet ER 50-1000 mg 2x a day, with meals - Rybelsus 3 mg daily before breakfast Tried Trulicity >> nausea, lightheadedness.  We changed to: - Metformin ER  1000 mg 2x a day with meals (L and D) - Rybelsus 7 >> 14 mg daily-not tolerating well - Farxiga 10 mg before b'fast  Pt checks his sugars 1-2x a day and they are: - am: 138, 140-190, 200 >> 120, 135-172, 207 >> 98-175, 189 - 2h after b'fast: 128-292 >> 113-139, 167 - before lunch: n/c >> 139, 180, 248 >> 128 - 2h after lunch: 200-225 >> 122-268, 308 >> 90-127 - before dinner: n/c - 2h after dinner: n/c >> 118, 134 >> 106 - bedtime: 180-220 >> n/c - nighttime: n/c Lowest sugar was 138 >> 120 >> 98; ? hypoglycemia awareness..  Highest sugar was 303 >> 308 >> 189.  Glucometer: One Touch ultra  Pt's meals are: - Breakfast: oatmeal, cereals, granola - Lunch: sandwich on rye or whole wheat; bacon and eggs - Dinner: vegetable, potatoes, meat or pasta - Snacks: 2-3 -stopped sweets  - + mild CKD, last BUN/creatinine:  Lab Results  Component Value Date   BUN 25 07/19/2020   BUN 24 01/31/2020   CREATININE 1.26 07/19/2020   CREATININE 1.10 01/31/2020  On Benicar 20.  -+ HL; last set of lipids: Lab Results  Component Value Date   CHOL 91 (L) 04/03/2021   HDL 27 (L) 04/03/2021   LDLCALC 41 04/03/2021   TRIG 125 04/03/2021   CHOLHDL 3.4 04/03/2021  On Crestor 40, Vascepa.  - last eye exam was in 08/2020: No DR.   - no numbness and tingling in his  feet. He has a foor sput - plantar fasciitis.  Pt has FH of DM in F, S, GM.  He also has a history of HTN, GERD, multinodular goiter.  Thyroid ultrasound (07/05/2012) showed very small nodules, stable: Right thyroid lobe:  5.0 x 1.7 x 2.3 cm. Left thyroid lobe:  5.6 x 1.9 x 2.4 cm. Isthmus:   Focal nodules:  Multiple bilateral small thyroid nodules.  The largest on the right is 10 mm in the lower pole compared 13 mm previously.  The largest on the left is 10 mm in the mid pole compared to 9 mm on the previous study.  No significant change.  No dominant nodule.   Lymphadenopathy:   None.   IMPRESSION: Numerous bilateral small  thyroid nodules, none significantly changed.  He saw Dr. Dalbert Batman in the past and had FNAs:   Date Taken: 05/10/2008   Satisfactory for evaluation.    INTERPRETATION(S):   THYROID, LEFT, FINE NEEDLE ASPIRATION (THIN PREP, SMEARS BLOCK):   - FINDINGS CONSISTENT WITH NON-NEOPLASTIC GOITER.   - SEE COMMENT.   Date Taken: 05/10/2008  Satisfactory for evaluation.    INTERPRETATION(S):   THYROID, RIGHT, FINE NEEDLE ASPIRATION (THIN PREP, SMEARS BLOCK):   - FINDINGS CONSISTENT WITH NON-NEOPLASTIC GOITER.   - SEE COMMENT.   Thyroid ultrasound (11/03/2019): Parenchymal Echotexture: Moderately heterogenous Isthmus: 0.4 cm thickness, stable Right lobe: 5.6 x 1.7 x 2.1 cm, previously 5.3 x 2.1 x 2.1 Left lobe: 4.9 x 2 x 1.9 cm, previously 5.4 x 2.2 x 1.7 _____________________________________________________________   Nodule # 1: Prior biopsy: No Location: Right; Inferior Maximum size: 0.98 cm; Other 2 dimensions: 0.8 x 0.8 cm, previously, 0.7 x 0.6 x 0.6 cm  Composition: mixed cystic and solid (1)  Echogenicity: hypoechoic (2) Significant change in size (>/= 20% in two dimensions and minimal increase of 2 mm): Yes *Given size (<1.4 cm) and appearance, this nodule does NOT meet TI-RADS criteria for biopsy or dedicated follow-up. ________________________________________________________   0.6 cm mixed solid/cystic inferior left nodule, previously 0.7; This nodule does NOT meet TI-RADS criteria for biopsy or dedicated follow-up.   Additional scattered hypoechoic lesions throughout both lobes all less than 0.5 cm diameter.   IMPRESSION: 1. Stable mild thyromegaly with scattered subcentimeter nodules. None meet criteria for biopsy or dedicated imaging follow-up.  Reviewed latest TSH: Lab Results  Component Value Date   TSH 1.95 10/27/2019   TSH 2.710 02/10/2018   TSH 3.820 04/01/2016   Pt denies: - feeling nodules in neck - hoarseness - dysphagia - choking - SOB with lying  down  No FH of thyroid cancer. No h/o radiation tx to head or neck. +FH of thyroid ds in sister, GM  He also has BPH with increased nocturia.  ROS: Constitutional: no weight gain, no weight loss, no fatigue, no subjective hyperthermia, no subjective hypothermia, + nocturia Eyes: no blurry vision, no xerophthalmia ENT: no sore throat, + see HPI Cardiovascular: no CP, no SOB, no palpitations, no leg swelling Respiratory: no cough, no SOB, no wheezing Gastrointestinal: no N, no V, no D, no C, + occasional acid reflux Musculoskeletal: no muscle, + joint aches Skin: no rash, no hair loss Neurological: no tremors, no numbness or tingling/no dizziness/no HAs  I reviewed pt's medications, allergies, PMH, social hx, family hx, and changes were documented in the history of present illness. Otherwise, unchanged from my initial visit note.   Past Medical History:  Diagnosis Date   Arthritis    Asthma  as child   Complication of anesthesia    hard to waken   Diabetes ([place])    type II   Diverticulosis    Dysrhythmia    PAC's   Goiter    History of hiatal hernia    Hyperlipidemia    Hypertension    Kidney stones    Stress fracture    left knee, right hip   Thyroid disease    Past Surgical History:  Procedure Laterality Date   CYSTOSCOPY WITH INSERTION OF UROLIFT     JOINT REPLACEMENT     KIDNEY STONES  2006   SKIN CANCER DESTRUCTION Left    deltoid- [facility]   SKIN CANCER EXCISION Right 09/16/2018   shoulder    TONSILLECTOMY     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   Procedure: RIGHT TOTAL HIP ARTHROPLASTY ANTERIOR APPROACH;  Surgeon: Gaynelle Arabian, MD;  Location: [facility];  Service: Orthopedics;  Laterality: Right;   WISDOM TOOTH EXTRACTION     Social History   Socioeconomic History   Marital status: Married    Spouse name: Not on file   Number of children: 3   Years of education: Not on file   Highest education level: Not on file  Occupational History    Employer:   Funeral director's assistant  Tobacco Use   Smoking status: Former Smoker    Quit date: 11/02/1974    Years since quitting: 45.0   Smokeless tobacco: Never Used  Substance and Sexual Activity   Alcohol use: Yes    Alcohol/week: 1.0 - 2.0 standard drinks    Types: 1 - 2 Standard drinks or equivalent per week    Comment: 2-3 PER WEEK   Drug use: no    Types:    Sexual activity: Not on file  Other Topics Concern   Not on file  Social History Narrative   Lives at home with wife.     Social Determinants of Health   Financial Resource Strain:    Difficulty of Paying Living Expenses: Not on file  Food Insecurity:    Worried About Charity fundraiser in the Last Year: Not on file   [employer] of Food in the Last Year: Not on file  Transportation Needs:    Lack of Transportation (Medical): Not on file   Lack of Transportation (Non-Medical): Not on file  Physical Activity:    Days of Exercise per Week: Not on file   Minutes of Exercise per Session: Not on file  Stress:    Feeling of Stress : Not on file  Social Connections:    Frequency of Communication with Friends and Family: Not on file   Frequency of Social Gatherings with Friends and Family: Not on file   Attends Religious Services: Not on file   Active Member of Clubs or Organizations: Not on file   Attends Archivist Meetings: Not on file   Marital Status: Not on file  Intimate Partner Violence:    Fear of Current or Ex-Partner: Not on file   Emotionally Abused: Not on file   Physically Abused: Not on file   Sexually Abused: Not on file   Current Outpatient Medications on File Prior to Visit  Medication Sig Dispense Refill   aspirin 81 MG tablet Take by mouth daily.      Cholecalciferol (VITAMIN D) 2000 units CAPS Take 1 capsule by mouth daily.     FARXIGA 10 MG TABS tablet TAKE 1 TABLET DAILY BEFORE BREAKFAST 90  tablet 1   glucose blood (ONETOUCH ULTRA) test strip Check BS twice daily Dx E11.59 200 strip 3    ibuprofen (ADVIL) 200 MG tablet Take 600 mg by mouth every 6 (six) hours as needed.     Lancets (ONETOUCH ULTRASOFT) lancets CHECK BLOOD SUGAR DAILY AND AS NEEDED. E11.9, 100 each 3   metFORMIN (GLUCOPHAGE-XR) 500 MG 24 hr tablet TAKE 2 TABLETS (1,000 MG TOTAL) BY MOUTH 2 (TWO) TIMES DAILY WITH A MEAL. 360 tablet 3   olmesartan (BENICAR) 20 MG tablet Take 0.5 tablets (10 mg total) by mouth daily. 45 tablet 3   rosuvastatin (CRESTOR) 40 MG tablet TAKE 1 TABLET BY MOUTH EVERY DAY 90 tablet 3   Semaglutide (RYBELSUS) 14 MG TABS Take 14 mg by mouth daily. 90 tablet 3   VASCEPA 1 g capsule TAKE 2 CAPSULES (2 G TOTAL) BY MOUTH 2 (TWO) TIMES DAILY. 120 capsule 5   Vitamins/Minerals TABS Take by mouth.     No current facility-administered medications on file prior to visit.   Allergies  Allergen Reactions   Codeine     REACTION: agitation   Erythromycin     REACTION: diarrhea   Latex     rash   Other     Melon- itchy throat   Family History  Problem Relation Age of Onset   Cancer Sister        ovarian   Diabetes Sister    Thyroid disease Sister    Breast cancer Mother 108   Hypertension Mother    Cancer Mother    CAD Father 51       Died age 80 after CABG   Stroke Father    Hypertension Father    AAA (abdominal aortic aneurysm) Father    Hyperlipidemia Father    Heart disease Father     PE: BP 110/68 (BP Location: Right Arm, Patient Position: Sitting, Cuff Size: Normal)   Pulse (!) 101   Ht 6' (1.829 m)   Wt 186 lb 9.6 oz (84.6 kg)   SpO2 97%   BMI 25.31 kg/m  Wt Readings from Last 3 Encounters:  04/04/21 186 lb 9.6 oz (84.6 kg)  04/03/21 186 lb (84.4 kg)  02/12/21 190 lb 6.4 oz (86.4 kg)   Constitutional: overweight, in NAD Eyes: PERRLA, EOMI, no exophthalmos ENT: moist mucous membranes, no thyromegaly, no cervical lymphadenopathy Cardiovascular: RRR, No MRG Respiratory: CTA B Gastrointestinal: abdomen soft, NT, ND, BS+ Musculoskeletal: no deformities, strength  intact in all 4 Skin: moist, warm, no rashes Neurological: no tremor with outstretched hands, DTR normal in all 4  ASSESSMENT: 1. DM2, non-insulin-dependent, uncontrolled, with complications - CAD - CKD  Component     Latest Ref Rng & Units 01/14/2021  Glucose     65 - 99 mg/dL 122 (H)  Hemoglobin A1C     4.0 - 5.6 % 7.0 (A)  C-Peptide     0.80 - 3.85 ng/mL 2.54  Islet Cell Ab     Neg:<1:1 Negative  ZNT8 Antibodies     <15 U/mL <10  Glutamic Acid Decarb Ab     <5 IU/mL <5  No signs of insulin deficiency or pancreatic autoimmunity.  2. HL  3. MNG  PLAN:  1. Patient with longstanding, uncontrolled, type 2 diabetes, on oral antidiabetic regimen with metformin, SGLT2 inhibitor and GLP-1 receptor agonist, who returned after a long absence at last visit.  At that time, HbA1c was 7.0%, which was improved, however, this was lower than expected  from his log.  In the past, he had GI symptoms and he came off all of the medicines and then restarted them slowly.  When I last saw him sugars are fluctuating, up to 200s" 300s after a meal.  We discussed about possibly increasing Rybelsus initially to 7 alternating with 14 mg every other day, and then increasing to 14 mg daily.  We continued [place] and metformin.  I suggested a referral to nutrition and he saw Leonia Reader since last visit.  He lost 9 pounds since then! -At last visit, we ruled out insulin deficiency and pancreatic autoimmunity - He had a HbA1c yesterday: 6.7% (lower)! -At today's visit, sugars are improved from before, with occasional spikes in the 140s to 150s in the morning and rarely higher.  Overall, due to the improvement in his blood sugars, we discussed about continuing the same regimen and continue to pay attention to his diet.  I suggested further changes in his diet including reducing fatty foods. - I suggested to:  Patient Instructions  Please continue: - Metformin ER 1000 mg 2x a day with meals - Farxiga 10 mg  before b'fast - Rybelsus 14 mg before b'fast  Please return in 3-4 months with your sugar log.   - advised to check sugars at different times of the day - 1x a day, rotating check times - advised for yearly eye exams >> he is UTD - return to clinic in 3-4 months  2. HL - Reviewed latest lipid panel from yesterday: Fractions at goal with the exception of a low HDL: Lab Results  Component Value Date   CHOL 91 (L) 04/03/2021   HDL 27 (L) 04/03/2021   LDLCALC 41 04/03/2021   TRIG 125 04/03/2021   CHOLHDL 3.4 04/03/2021  - Continues Crestor 40 and Vascepa 2 mg twice a day without side effects. -Discussed about improving diet by reducing saturated fats and including more fiber.  3. MNG -No neck compression symptoms -Previously investigated with ultrasound (in 2013-reviewed report) and also FNA (in 2019-reviewed report) and these were benign.  He had another thyroid ultrasound in 01/2020 and he had only 1 nodule measuring slightly less than 1 cm, the rest of the nodules smaller.   -He had another thyroid ultrasound obtained after last visit (11/03/2019) and he only had 1 nodule measuring slightly less than 1 cm, the rest of the nodules being smaller.   -Latest TSH was normal Lab Results  Component Value Date   TSH 1.95 10/27/2019  -No further follow-up is recommended  Philemon Kingdom, MD PhD [facility] Endocrinology

## 2021-04-04 NOTE — Patient Instructions (Addendum)
Please continue: - Metformin ER 1000 mg 2x a day with meals - Farxiga 10 mg before b'fast - Rybelsus 14 mg before b'fast  Look into the "portfolio diet".  Please return in 3-4 months with your sugar log.

## 2021-04-11 ENCOUNTER — Ambulatory Visit: Payer: BC Managed Care – PPO | Admitting: Internal Medicine

## 2021-04-17 ENCOUNTER — Telehealth: Payer: Self-pay | Admitting: Family Medicine

## 2021-04-17 NOTE — Telephone Encounter (Cosign Needed)
I spoke with Austin Graham on the phone about his colon cancer screening. He is due for his colonoscopy and had one scheduled recently. Insurance would not cover it as a routine screening due to his family history of colon cancer. They charged $2000 so he had to cancel. He would like to know if there is a way to work with insurance to reduce the cost. He also mentioned that he would call the clinic after vacation to schedule a visit with Dr. Warrick Parisian.

## 2021-04-30 ENCOUNTER — Other Ambulatory Visit: Payer: Self-pay | Admitting: Family Medicine

## 2021-05-14 ENCOUNTER — Ambulatory Visit: Payer: BC Managed Care – PPO | Admitting: Cardiology

## 2021-06-09 ENCOUNTER — Other Ambulatory Visit: Payer: Self-pay | Admitting: Family Medicine

## 2021-06-09 ENCOUNTER — Other Ambulatory Visit: Payer: Self-pay | Admitting: Internal Medicine

## 2021-07-02 ENCOUNTER — Encounter: Payer: Self-pay | Admitting: Family Medicine

## 2021-07-02 ENCOUNTER — Ambulatory Visit (INDEPENDENT_AMBULATORY_CARE_PROVIDER_SITE_OTHER): Payer: BC Managed Care – PPO | Admitting: Family Medicine

## 2021-07-02 ENCOUNTER — Ambulatory Visit (INDEPENDENT_AMBULATORY_CARE_PROVIDER_SITE_OTHER): Payer: BC Managed Care – PPO

## 2021-07-02 VITALS — BP 134/72 | HR 91 | Temp 98.2°F

## 2021-07-02 DIAGNOSIS — R0989 Other specified symptoms and signs involving the circulatory and respiratory systems: Secondary | ICD-10-CM | POA: Diagnosis not present

## 2021-07-02 DIAGNOSIS — R051 Acute cough: Secondary | ICD-10-CM

## 2021-07-02 LAB — VERITOR FLU A/B WAIVED
Influenza A: NEGATIVE
Influenza B: NEGATIVE

## 2021-07-02 MED ORDER — PROMETHAZINE-DM 6.25-15 MG/5ML PO SYRP: 2.5000 mL | ORAL_SOLUTION | Freq: Four times a day (QID) | ORAL | 0 refills | Status: DC | PRN

## 2021-07-02 MED ORDER — ICOSAPENT ETHYL 1 G PO CAPS: 2.0000 g | ORAL_CAPSULE | Freq: Two times a day (BID) | ORAL | 3 refills | Status: DC

## 2021-07-02 MED ORDER — ALBUTEROL SULFATE HFA 108 (90 BASE) MCG/ACT IN AERS: 2.0000 | INHALATION_SPRAY | Freq: Four times a day (QID) | RESPIRATORY_TRACT | 0 refills | Status: DC | PRN

## 2021-07-02 MED ORDER — BENZONATATE 100 MG PO CAPS
100.0000 mg | ORAL_CAPSULE | Freq: Three times a day (TID) | ORAL | 0 refills | Status: DC | PRN
Start: 2021-07-02 — End: 2021-10-03

## 2021-07-02 MED ORDER — PREDNISONE 20 MG PO TABS: 40.0000 mg | ORAL_TABLET | Freq: Every day | ORAL | 0 refills | Status: AC

## 2021-07-02 MED ORDER — METHYLPREDNISOLONE ACETATE 40 MG/ML IJ SUSP
40.0000 mg | Freq: Once | INTRAMUSCULAR | Status: AC
  Administered 2021-07-02: 40 mg via INTRAMUSCULAR

## 2021-07-02 NOTE — Patient Instructions (Signed)
Steroid shot given today Start prednisone tomorrow. Cough suppressants: Tessalon (non drowsy) and Promethazine DM (causes sleepiness) Chest xray obtained.  Will contact with results once available. Acute Bronchitis, Adult Acute bronchitis is when air tubes in the lungs (bronchi) suddenly get swollen. The condition can make it hard for you to breathe. In adults, acute bronchitis usually goes away within 2 weeks. A cough caused by bronchitis may last up to 3 weeks. Smoking, allergies, and asthma can make the condition worse. What are the causes? This condition is caused by: Cold and flu viruses. The most common cause of this condition is the virus that causes the common cold. Bacteria. Substances that irritate the lungs, including: Smoke from cigarettes and other types of tobacco. Dust and pollen. Fumes from chemicals, gases, or burned fuel. Other materials that pollute indoor or outdoor air. Close contact with someone who has acute bronchitis. What increases the risk? The following factors may make you more likely to develop this condition: A weak body's defense system. This is also called the immune system. Any condition that affects your lungs and breathing, such as asthma. What are the signs or symptoms? Symptoms of this condition include: A cough. Coughing up clear, yellow, or green mucus. Wheezing. Having too much mucus in your lungs (chest congestion). Shortness of breath. A fever. Chills. Body aches. A sore throat. How is this treated? Acute bronchitis may go away over time without treatment. Your doctor may recommend: Drinking more fluids. Using a device that gets medicine into your lungs (inhaler). Using a vaporizer or a humidifier. These are machines that add water or moisture to the air. This helps with coughing and poor breathing. Taking a medicine for fever. Taking a medicine that thins mucus and clears congestion. Taking a medicine that prevents or stops  coughing. Follow these instructions at home: Activity Get a lot of rest. Return to your normal activities as told by your doctor. Ask your doctor what activities are safe for you. Lifestyle  Drink enough fluid to keep your pee (urine) pale yellow. Do not drink alcohol. Do not use any products that contain nicotine or tobacco, such as cigarettes, e-cigarettes, and chewing tobacco. If you need help quitting, ask your doctor. Be aware that: Your bronchitis will get worse if you smoke or breathe in other people's smoke (secondhand smoke). Your lungs will heal faster if you quit smoking. General instructions Take over-the-counter and prescription medicines only as told by your doctor. Use an inhaler, cool mist vaporizer, or humidifier as told by your doctor. Rinse your mouth often with salt water. To make salt water, dissolve -1 tsp (3-6 g) of salt in 1 cup (237 mL) of warm water. Take two teaspoons of honey at bedtime. This helps lessen your coughing at night. Keep all follow-up visits as told by your doctor. This is important. How is this prevented? To lower your risk of getting this condition again: Wash your hands often with soap and water. If you cannot use soap and water, use hand sanitizer. Avoid contact with people who have cold symptoms. Try not to touch your mouth, nose, or eyes with your hands. Make sure to get the flu shot every year. Contact a doctor if: Your symptoms do not get better in 2 weeks. You vomit more than once or twice. You have symptoms of loss of fluid from your body (dehydration). These include: Dark pee. Dry skin or eyes. Increased thirst. Headaches. Confusion. Muscle cramps. Get help right away if: You cough up blood.  You have chest pain. You have very bad shortness of breath. You become dehydrated. You faint or keep feeling like you are going to faint. You have a very bad headache. Your fever or chills get worse. These symptoms may be an emergency.  Get help right away. Call your local emergency services (911 in the [place] Do not wait to see if the symptoms will go away. Do not drive yourself to the hospital. Summary Acute bronchitis is when air tubes in the lungs (bronchi) suddenly get swollen. In adults, acute bronchitis usually goes away within 2 weeks. Take over-the-counter and prescription medicines only as told by your doctor. Drink enough fluid to keep your pee (urine) pale yellow. Contact a doctor if your symptoms do not improve after 2 weeks of treatment. Get help right away if you cough up blood, faint, or have chest pain or shortness of breath. This information is not intended to replace advice given to you by your health care provider. Make sure you discuss any questions you have with your health care provider. Document Revised: 08/14/2020 Document Reviewed: 04/07/2019 Elsevier Patient Education  [place].

## 2021-07-02 NOTE — Progress Notes (Signed)
Subjective: IO:XBDZH PCP: Dettinger, Fransisca Kaufmann, MD GDJ:MEQAST Austin Graham is a 70 y.o. male presenting to clinic today for:  1. URI He reports that he has had a few day history of harsh coughing such that he gets a rib pain.  He thought that he might have a little bit of yellow or pink-tinged sputum recently.  He does not like taking a deep breath in because this precipitates coughing.  He reports some wheezing that is more apparent at nighttime.  His grandson has been sick but not to the extent that he is.   ROS: Per HPI  Allergies  Allergen Reactions   Codeine     REACTION: agitation   Erythromycin     REACTION: diarrhea   Latex     rash   Other     Melon- itchy throat   Past Medical History:  Diagnosis Date   Arthritis    Asthma    as child   Complication of anesthesia    hard to waken   Diabetes ([place])    type II   Diverticulosis    Dysrhythmia    PAC's   Goiter    History of hiatal hernia    Hyperlipidemia    Hypertension    Kidney stones    Stress fracture    left knee, right hip   Thyroid disease     Current Outpatient Medications:    aspirin 81 MG tablet, Take by mouth daily. , Disp: , Rfl:    Cholecalciferol (VITAMIN D) 2000 units CAPS, Take 1 capsule by mouth daily., Disp: , Rfl:    FARXIGA 10 MG TABS tablet, TAKE 1 TABLET BY MOUTH DAILY BEFORE BREAKFAST, Disp: 90 tablet, Rfl: 1   glucose blood test strip, Use as instructed - one touch verio, Disp: 100 each, Rfl: 3   ibuprofen (ADVIL) 200 MG tablet, Take 600 mg by mouth every 6 (six) hours as needed., Disp: , Rfl:    icosapent Ethyl (VASCEPA) 1 g capsule, Take 2 capsules (2 g total) by mouth 2 (two) times daily. (NEEDS TO BE SEEN BEFORE NEXT REFILL), Disp: 120 capsule, Rfl: 0   metFORMIN (GLUCOPHAGE-XR) 500 MG 24 hr tablet, TAKE 2 TABLETS (1,000 MG TOTAL) BY MOUTH 2 (TWO) TIMES DAILY WITH A MEAL., Disp: 360 tablet, Rfl: 3   olmesartan (BENICAR) 20 MG tablet, Take 0.5 tablets (10 mg total) by mouth daily.,  Disp: 45 tablet, Rfl: 3   OneTouch Delica Lancets 41D MISC, Check 1x a day, Disp: 100 each, Rfl: 3   rosuvastatin (CRESTOR) 40 MG tablet, TAKE 1 TABLET BY MOUTH EVERY DAY, Disp: 90 tablet, Rfl: 3   Semaglutide (RYBELSUS) 14 MG TABS, Take 14 mg by mouth daily., Disp: 90 tablet, Rfl: 3   Vitamins/Minerals TABS, Take by mouth., Disp: , Rfl:  Social History   Socioeconomic History   Marital status: Married    Spouse name: Not on file   Number of children: 3   Years of education: Not on file   Highest education level: Not on file  Occupational History    Employer: [employer]  Tobacco Use   Smoking status: Former    Types: Cigarettes    Quit date: 11/02/1974    Years since quitting: 46.6   Smokeless tobacco: Never  Vaping Use   Vaping Use: Never used  Substance and Sexual Activity   Alcohol use: Yes    Alcohol/week: 1.0 - 2.0 standard drink    Types: 1 - 2 Standard  drinks or equivalent per week    Comment: 2-3 PER WEEK   Drug use: Yes    Types: Marijuana   Sexual activity: Not on file  Other Topics Concern   Not on file  Social History Narrative   Lives at home with wife.     Social Determinants of Health   Financial Resource Strain: Not on file  Food Insecurity: Not on file  Transportation Needs: Not on file  Physical Activity: Not on file  Stress: Not on file  Social Connections: Not on file  Intimate Partner Violence: Not on file   Family History  Problem Relation Age of Onset   Cancer Sister        ovarian   Diabetes Sister    Thyroid disease Sister    Breast cancer Mother 56   Hypertension Mother    Cancer Mother    CAD Father 66       Died age 39 after CABG   Stroke Father    Hypertension Father    AAA (abdominal aortic aneurysm) Father    Hyperlipidemia Father    Heart disease Father     Objective: Office vital signs reviewed. BP 134/72   Pulse 91   Temp 98.2 F (36.8 C)   SpO2 96%   Physical Examination:  General: Awake, alert,  nontoxic, No acute distress HEENT: Normal; sclera white.  TMs intact bilaterally without erythema.  He does have oropharyngeal erythema without exudates.  Nasal discharge appears clear. Cardio: regular rate and rhythm, S1S2 heard, no murmurs appreciated Pulm: Shallow breathing.  Coughing harshly intermittently.  Cough sounds wet.  DG Chest 2 View  Result Date: 07/02/2021 CLINICAL DATA:  Harsh coughing, history diabetes mellitus, hypertension, former smoker EXAM: CHEST - 2 VIEW COMPARISON:  07/14/2018 FINDINGS: Normal heart size, mediastinal contours, and pulmonary vascularity. Atherosclerotic calcification aorta. Lungs clear. No pulmonary infiltrate, pleural effusion, or pneumothorax. Radiopaque foreign body consistent with button projects over the medial RIGHT upper lobe. No acute osseous findings. IMPRESSION: Normal exam. Electronically Signed   By: Lavonia Dana M.D.   On: 07/02/2021 16:55    Assessment/ Plan: 70 y.o. male   Acute cough - Plan: Novel Coronavirus, NAA ([facility]), Veritor Flu A/B Waived, methylPREDNISolone acetate (DEPO-MEDROL) injection 40 mg, DG Chest 2 View, promethazine-dextromethorphan (PROMETHAZINE-DM) 6.25-15 MG/5ML syrup, benzonatate (TESSALON PERLES) 100 MG capsule, predniSONE (DELTASONE) 20 MG tablet, albuterol (VENTOLIN HFA) 108 (90 Base) MCG/ACT inhaler  Chest congestion - Plan: Novel Coronavirus, NAA ([facility]), Veritor Flu A/B Waived, methylPREDNISolone acetate (DEPO-MEDROL) injection 40 mg, DG Chest 2 View, albuterol (VENTOLIN HFA) 108 (90 Base) MCG/ACT inhaler  We will treat as a bronchitis.  Depo-Medrol administered.  Chest x-ray ordered just because his coughing has been so harsh and his lung exam was technically difficult.  Chest x-ray showed no acute infiltrates.  Likely viral.  We have tested him for influenza which was negative and COVID virus which will return in a few days.  If he has COVID positive would certainly treat him with antivirals given advanced age and  type 2 diabetes.  Tessalon Perles, Promethazine DM and prednisone burst also given.  He understands red flag signs and symptoms warranting further evaluation.  Follow-up as needed  No orders of the defined types were placed in this encounter.  No orders of the defined types were placed in this encounter.    Janora Norlander, DO [place] [phone]

## 2021-07-03 LAB — SARS-COV-2, NAA 2 DAY TAT

## 2021-07-03 LAB — NOVEL CORONAVIRUS, NAA: SARS-CoV-2, NAA: NOT DETECTED

## 2021-07-24 ENCOUNTER — Other Ambulatory Visit: Payer: Self-pay | Admitting: Family Medicine

## 2021-07-24 DIAGNOSIS — R051 Acute cough: Secondary | ICD-10-CM

## 2021-07-24 DIAGNOSIS — R0989 Other specified symptoms and signs involving the circulatory and respiratory systems: Secondary | ICD-10-CM

## 2021-07-24 DIAGNOSIS — I10 Essential (primary) hypertension: Secondary | ICD-10-CM

## 2021-08-15 ENCOUNTER — Ambulatory Visit: Payer: BC Managed Care – PPO | Admitting: Internal Medicine

## 2021-09-12 ENCOUNTER — Encounter: Payer: Self-pay | Admitting: Family Medicine

## 2021-09-12 ENCOUNTER — Ambulatory Visit (INDEPENDENT_AMBULATORY_CARE_PROVIDER_SITE_OTHER): Payer: BC Managed Care – PPO | Admitting: Family Medicine

## 2021-09-12 ENCOUNTER — Ambulatory Visit: Payer: BC Managed Care – PPO | Admitting: Internal Medicine

## 2021-09-12 DIAGNOSIS — J069 Acute upper respiratory infection, unspecified: Secondary | ICD-10-CM

## 2021-09-12 MED ORDER — GUAIFENESIN ER 600 MG PO TB12: 600.0000 mg | ORAL_TABLET | Freq: Two times a day (BID) | ORAL | 0 refills | Status: AC

## 2021-09-12 MED ORDER — AMOXICILLIN-POT CLAVULANATE 875-125 MG PO TABS: 1.0000 | ORAL_TABLET | Freq: Two times a day (BID) | ORAL | 0 refills | Status: AC

## 2021-09-12 NOTE — Progress Notes (Signed)
Virtual Visit via telephone note Due to COVID-19 pandemic this visit was conducted virtually. This visit type was conducted due to national recommendations for restrictions regarding the COVID-19 Pandemic (e.g. social distancing, sheltering in place) in an effort to limit this patient's exposure and mitigate transmission in our community. All issues noted in this document were discussed and addressed.  A physical exam was not performed with this format.   I connected with Elder Cyphers on 09/12/2021 at [phone] by telephone and verified that I am speaking with the correct person using two identifiers. Orville Mena is currently located at home and family is currently with them during visit. The provider, Monia Pouch, FNP is located in their office at time of visit.  I discussed the limitations, risks, security and privacy concerns of performing an evaluation and management service by virtual visit and the availability of in person appointments. I also discussed with the patient that there may be a patient responsible charge related to this service. The patient expressed understanding and agreed to proceed.  Subjective:  Patient ID: Austin Graham, male    DOB: Jan 06, 1951, 70 y.o.   MRN: [id number]  Chief Complaint:  URI   HPI: Austin Graham is a 70 y.o. male presenting on 09/12/2021 for URI   Patient reports ongoing and worsening cough, chest congestion, sputum production, fever, chills, and postnasal drainage.  Symptoms started about 10 days ago has seem to be progressing.  Has been using Advil Cold and Sinus without relief of symptoms.  URI  This is a new problem. The current episode started 1 to 4 weeks ago. The problem has been gradually worsening. The maximum temperature recorded prior to his arrival was 101 - 101.9 F. The fever has been present for 3 to 4 days. Associated symptoms include congestion, coughing, headaches, rhinorrhea and a sore throat. Pertinent negatives include  no abdominal pain, chest pain, diarrhea, dysuria, ear pain, joint pain, joint swelling, nausea, neck pain, plugged ear sensation, rash, sinus pain, sneezing, swollen glands, vomiting or wheezing. He has tried decongestant and NSAIDs for the symptoms. The treatment provided no relief.    Relevant past medical, surgical, family, and social history reviewed and updated as indicated.  Allergies and medications reviewed and updated.   Past Medical History:  Diagnosis Date   Arthritis    Asthma    as child   Complication of anesthesia    hard to waken   Diabetes ([place])    type II   Diverticulosis    Dysrhythmia    PAC's   Goiter    History of hiatal hernia    Hyperlipidemia    Hypertension    Kidney stones    Stress fracture    left knee, right hip   Thyroid disease     Past Surgical History:  Procedure Laterality Date   CYSTOSCOPY WITH INSERTION OF UROLIFT     JOINT REPLACEMENT     KIDNEY STONES  2006   SKIN CANCER DESTRUCTION Left    deltoid- [facility]   SKIN CANCER EXCISION Right 09/16/2018   shoulder    TONSILLECTOMY     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   Procedure: RIGHT TOTAL HIP ARTHROPLASTY ANTERIOR APPROACH;  Surgeon: Gaynelle Arabian, MD;  Location: [facility];  Service: Orthopedics;  Laterality: Right;   WISDOM TOOTH EXTRACTION      Social History   Socioeconomic History   Marital status: Married    Spouse name: Not on file   Number of children: 3  Years of education: Not on file   Highest education level: Not on file  Occupational History    Employer: [employer]  Tobacco Use   Smoking status: Former    Types: Cigarettes    Quit date: 11/02/1974    Years since quitting: 46.8   Smokeless tobacco: Never  Vaping Use   Vaping Use: Never used  Substance and Sexual Activity   Alcohol use: Yes    Alcohol/week: 1.0 - 2.0 standard drink    Types: 1 - 2 Standard drinks or equivalent per week    Comment: 2-3 PER WEEK   Drug use: Yes    Types: Marijuana    Sexual activity: Not on file  Other Topics Concern   Not on file  Social History Narrative   Lives at home with wife.     Social Determinants of Health   Financial Resource Strain: Not on file  Food Insecurity: Not on file  Transportation Needs: Not on file  Physical Activity: Not on file  Stress: Not on file  Social Connections: Not on file  Intimate Partner Violence: Not on file    Outpatient Encounter Medications as of 09/12/2021  Medication Sig   amoxicillin-clavulanate (AUGMENTIN) 875-125 MG tablet Take 1 tablet by mouth 2 (two) times daily for 10 days.   guaiFENesin (MUCINEX) 600 MG 12 hr tablet Take 1 tablet (600 mg total) by mouth 2 (two) times daily for 10 days.   albuterol (VENTOLIN HFA) 108 (90 Base) MCG/ACT inhaler TAKE 2 PUFFS BY MOUTH EVERY 6 HOURS AS NEEDED FOR WHEEZE OR SHORTNESS OF BREATH   aspirin 81 MG tablet Take by mouth daily.    benzonatate (TESSALON PERLES) 100 MG capsule Take 1 capsule (100 mg total) by mouth 3 (three) times daily as needed for cough.   Cholecalciferol (VITAMIN D) 2000 units CAPS Take 1 capsule by mouth daily.   FARXIGA 10 MG TABS tablet TAKE 1 TABLET BY MOUTH DAILY BEFORE BREAKFAST   glucose blood test strip Use as instructed - one touch verio   ibuprofen (ADVIL) 200 MG tablet Take 600 mg by mouth every 6 (six) hours as needed.   icosapent Ethyl (VASCEPA) 1 g capsule Take 2 capsules (2 g total) by mouth 2 (two) times daily. SCHEDULE CHECK UP   metFORMIN (GLUCOPHAGE-XR) 500 MG 24 hr tablet TAKE 2 TABLETS (1,000 MG TOTAL) BY MOUTH 2 (TWO) TIMES DAILY WITH A MEAL.   olmesartan (BENICAR) 20 MG tablet TAKE 1/2 TABLET BY MOUTH DAILY   OneTouch Delica Lancets 94W MISC Check 1x a day   promethazine-dextromethorphan (PROMETHAZINE-DM) 6.25-15 MG/5ML syrup Take 2.5 mLs by mouth 4 (four) times daily as needed for cough.   rosuvastatin (CRESTOR) 40 MG tablet TAKE 1 TABLET BY MOUTH EVERY DAY   Semaglutide (RYBELSUS) 14 MG TABS Take 14 mg by mouth  daily.   Vitamins/Minerals TABS Take by mouth.   No facility-administered encounter medications on file as of 09/12/2021.    Allergies  Allergen Reactions   Codeine     REACTION: agitation   Erythromycin     REACTION: diarrhea   Latex     rash   Other     Melon- itchy throat    Review of Systems  Constitutional:  Positive for activity change, appetite change, chills, fatigue and fever. Negative for diaphoresis and unexpected weight change.  HENT:  Positive for congestion, postnasal drip, rhinorrhea and sore throat. Negative for dental problem, drooling, ear discharge, ear pain, facial swelling, hearing loss,  mouth sores, nosebleeds, sinus pressure, sinus pain, sneezing, tinnitus, trouble swallowing and voice change.   Respiratory:  Positive for cough. Negative for apnea, choking, chest tightness, shortness of breath, wheezing and stridor.   Cardiovascular:  Negative for chest pain.  Gastrointestinal:  Negative for abdominal pain, diarrhea, nausea and vomiting.  Genitourinary:  Negative for decreased urine volume and dysuria.  Musculoskeletal:  Negative for joint pain and neck pain.  Skin:  Negative for rash.  Neurological:  Positive for headaches. Negative for dizziness, tremors, seizures, syncope, facial asymmetry, speech difficulty, weakness, light-headedness and numbness.  Psychiatric/Behavioral:  Negative for confusion.   All other systems reviewed and are negative.       Observations/Objective: No vital signs or physical exam, this was a virtual health encounter.  Pt alert and oriented, answers all questions appropriately, and able to speak in full sentences.    Assessment and Plan: Cosby was seen today for uri.  Diagnoses and all orders for this visit:  URI with cough and congestion Ongoing worsening URI symptoms for over 10 days.  Has failed conservative therapy at home.  Will initiate Augmentin as prescribed.  Continue symptomatic care at home, add Mucinex to  regimen with plenty of fluids.  Report any new, worsening, or persistent symptoms.  Follow-up as needed. -     amoxicillin-clavulanate (AUGMENTIN) 875-125 MG tablet; Take 1 tablet by mouth 2 (two) times daily for 10 days. -     guaiFENesin (MUCINEX) 600 MG 12 hr tablet; Take 1 tablet (600 mg total) by mouth 2 (two) times daily for 10 days.     Follow Up Instructions: Return if symptoms worsen or fail to improve.    I discussed the assessment and treatment plan with the patient. The patient was provided an opportunity to ask questions and all were answered. The patient agreed with the plan and demonstrated an understanding of the instructions.   The patient was advised to call back or seek an in-person evaluation if the symptoms worsen or if the condition fails to improve as anticipated.  The above assessment and management plan was discussed with the patient. The patient verbalized understanding of and has agreed to the management plan. Patient is aware to call the clinic if they develop any new symptoms or if symptoms persist or worsen. Patient is aware when to return to the clinic for a follow-up visit. Patient educated on when it is appropriate to go to the emergency department.    I provided 15 minutes of time during this telephone encounter.   Monia Pouch, FNP-C [facility] [address] [place], [place] [address] [phone] 09/12/2021

## 2021-09-19 LAB — HM DIABETES EYE EXAM

## 2021-10-03 ENCOUNTER — Other Ambulatory Visit: Payer: Self-pay

## 2021-10-03 ENCOUNTER — Encounter: Payer: Self-pay | Admitting: Internal Medicine

## 2021-10-03 ENCOUNTER — Ambulatory Visit (INDEPENDENT_AMBULATORY_CARE_PROVIDER_SITE_OTHER): Payer: BC Managed Care – PPO | Admitting: Internal Medicine

## 2021-10-03 VITALS — BP 118/65 | HR 95 | Ht 72.0 in | Wt 193.2 lb

## 2021-10-03 DIAGNOSIS — E1159 Type 2 diabetes mellitus with other circulatory complications: Secondary | ICD-10-CM | POA: Diagnosis not present

## 2021-10-03 DIAGNOSIS — E1165 Type 2 diabetes mellitus with hyperglycemia: Secondary | ICD-10-CM

## 2021-10-03 DIAGNOSIS — E042 Nontoxic multinodular goiter: Secondary | ICD-10-CM | POA: Diagnosis not present

## 2021-10-03 DIAGNOSIS — E782 Mixed hyperlipidemia: Secondary | ICD-10-CM

## 2021-10-03 LAB — POCT GLYCOSYLATED HEMOGLOBIN (HGB A1C): Hemoglobin A1C: 7.5 % — AB (ref 4.0–5.6)

## 2021-10-03 MED ORDER — GLIPIZIDE 5 MG PO TABS: 2.5000 mg | ORAL_TABLET | Freq: Every day | ORAL | 3 refills | Status: DC

## 2021-10-03 NOTE — Progress Notes (Signed)
Patient ID: Austin Graham, male   DOB: November 09, 1950, 71 y.o.   MRN: [id number]   This visit occurred during the SARS-CoV-2 public health emergency.  Safety protocols were in place, including screening questions prior to the visit, additional usage of staff PPE, and extensive cleaning of exam room while observing appropriate contact time as indicated for disinfecting solutions.   HPI: Austin Graham is a 71 y.o.-year-old male, initially referred by his cardiologist, Dr. Percival Spanish, returning for follow-up for DM2, dx in ~2013, non-insulin-dependent, uncontrolled, with complications (CAD, CKD) and also thyroid nodules.Last visit 3 months ago.  Interim history: Before last visit, he changed his diet and lost ~10 lbs. No blurry vision, nausea, chest pain. He had bronchitis 06/2021 >> steroids >> sugars higher. He relaxed his diet during the holidays and sugars have been higher.  Previously: I saw the patient first in 09/2019.  At that time, we stopped Janumet and started metformin ER, we increased Rybelsus dose and we added [place].  He developed diarrhea, indigestion, loss of appetite, nausea, after the visit, and he stopped all of his medicines.  The symptoms resolved and I advised him to start adding back the medicines one by one.  However, he was lost for follow-up afterwards and returned to see me in 12/2020.  Reviewed latest HbA1c levels: Lab Results  Component Value Date   HGBA1C 6.7 (H) 04/03/2021   HGBA1C 7.0 (A) 01/14/2021   HGBA1C 8.7 (H) 07/19/2020   HGBA1C 7.5 (H) 01/18/2020   HGBA1C 8.9 (A) 10/27/2019   HGBA1C 7.5 (H) 07/07/2019   HGBA1C 7.2 (H) 03/27/2019   HGBA1C 9.1 (H) 11/11/2018   HGBA1C 9.3 (H) 08/09/2018   HGBA1C 7.5 (H) 02/10/2018   At last visit he was on: - JanuMet ER 50-1000 mg 2x a day, with meals - Rybelsus 3 mg daily before breakfast Tried Trulicity >> nausea, lightheadedness.  We changed to: - Metformin ER 1000 mg 2x a day with meals (L and D) - Rybelsus 7  >> 14 mg daily-not tolerating well - Farxiga 10 mg before b'fast  Pt checks his sugars 1-2x a day and they are: - am: 120, 135-172, 207 >> 98-175, 189 >> 115-184 - 2h after b'fast: 128-292 >> 113-139, 167 >> 151-196 - before lunch: n/c >> 139, 180, 248 >> 128 >> 101, 144 - 2h after lunch: 122-268, 308 >> 90-127 >> 168-196 - before dinner: n/c >> 112 - 2h after dinner: n/c >> 118, 134 >> 106 >> 114-163 - bedtime: 180-220 >> n/c >> 164 - nighttime: n/c >> 114, 153 Lowest sugar was 138 >> 120 >> 98 >> 101; ? hypoglycemia awareness..  Highest sugar was 303 >> 308 >> 189 >> 196.  Glucometer: One Touch ultra  Pt's meals are: - Breakfast: oatmeal, cereals, granola - Lunch: sandwich on rye or whole wheat; bacon and eggs - Dinner: vegetable, potatoes, meat or pasta - Snacks: 2-3 -stopped sweets  - + mild CKD, last BUN/creatinine:  Lab Results  Component Value Date   BUN 25 07/19/2020   BUN 24 01/31/2020   CREATININE 1.26 07/19/2020   CREATININE 1.10 01/31/2020  On Benicar 20.  -+ HL; last set of lipids: Lab Results  Component Value Date   CHOL 91 (L) 04/03/2021   HDL 27 (L) 04/03/2021   LDLCALC 41 04/03/2021   TRIG 125 04/03/2021   CHOLHDL 3.4 04/03/2021  On Crestor 40, Vascepa.  - last eye exam was on 09/19/2021: No DR.   - no numbness and tingling in  his feet. He has a foor sput - plantar fasciitis.  Pt has FH of DM in F, S, GM.  He also has a history of HTN, GERD, multinodular goiter.  Thyroid ultrasound (07/05/2012) showed very small nodules, stable: Right thyroid lobe:  5.0 x 1.7 x 2.3 cm. Left thyroid lobe:  5.6 x 1.9 x 2.4 cm. Isthmus:   Focal nodules:  Multiple bilateral small thyroid nodules.  The largest on the right is 10 mm in the lower pole compared 13 mm previously.  The largest on the left is 10 mm in the mid pole compared to 9 mm on the previous study.  No significant change.  No dominant nodule.   Lymphadenopathy:   None.   IMPRESSION: Numerous  bilateral small thyroid nodules, none significantly changed.  He saw Dr. Dalbert Batman in the past and had FNAs:   Date Taken: 05/10/2008   Satisfactory for evaluation.    INTERPRETATION(S):   THYROID, LEFT, FINE NEEDLE ASPIRATION (THIN PREP, SMEARS BLOCK):   - FINDINGS CONSISTENT WITH NON-NEOPLASTIC GOITER.   - SEE COMMENT.   Date Taken: 05/10/2008  Satisfactory for evaluation.    INTERPRETATION(S):   THYROID, RIGHT, FINE NEEDLE ASPIRATION (THIN PREP, SMEARS BLOCK):   - FINDINGS CONSISTENT WITH NON-NEOPLASTIC GOITER.   - SEE COMMENT.   Thyroid ultrasound (11/03/2019): Parenchymal Echotexture: Moderately heterogenous Isthmus: 0.4 cm thickness, stable Right lobe: 5.6 x 1.7 x 2.1 cm, previously 5.3 x 2.1 x 2.1 Left lobe: 4.9 x 2 x 1.9 cm, previously 5.4 x 2.2 x 1.7 _____________________________________________________________   Nodule # 1: Prior biopsy: No Location: Right; Inferior Maximum size: 0.98 cm; Other 2 dimensions: 0.8 x 0.8 cm, previously, 0.7 x 0.6 x 0.6 cm  Composition: mixed cystic and solid (1)  Echogenicity: hypoechoic (2) Significant change in size (>/= 20% in two dimensions and minimal increase of 2 mm): Yes *Given size (<1.4 cm) and appearance, this nodule does NOT meet TI-RADS criteria for biopsy or dedicated follow-up. ________________________________________________________   0.6 cm mixed solid/cystic inferior left nodule, previously 0.7; This nodule does NOT meet TI-RADS criteria for biopsy or dedicated follow-up.   Additional scattered hypoechoic lesions throughout both lobes all less than 0.5 cm diameter.   IMPRESSION: 1. Stable mild thyromegaly with scattered subcentimeter nodules. None meet criteria for biopsy or dedicated imaging follow-up.  Reviewed latest TSH: Lab Results  Component Value Date   TSH 1.95 10/27/2019   TSH 2.710 02/10/2018   TSH 3.820 04/01/2016   Pt denies: - feeling nodules in neck - hoarseness - dysphagia - choking - SOB  with lying down  No FH of thyroid cancer. No h/o radiation tx to head or neck. +FH of thyroid ds in sister, GM  He also has BPH with nocturia.  ROS: + See HPI, + nocturia, + joint aches, + occasional acid reflux  I reviewed pt's medications, allergies, PMH, social hx, family hx, and changes were documented in the history of present illness. Otherwise, unchanged from my initial visit note.   Past Medical History:  Diagnosis Date   Arthritis    Asthma    as child   Complication of anesthesia    hard to waken   Diabetes ([place])    type II   Diverticulosis    Dysrhythmia    PAC's   Goiter    History of hiatal hernia    Hyperlipidemia    Hypertension    Kidney stones    Stress fracture    left knee, right hip  Thyroid disease    Past Surgical History:  Procedure Laterality Date   CYSTOSCOPY WITH INSERTION OF UROLIFT     JOINT REPLACEMENT     KIDNEY STONES  2006   SKIN CANCER DESTRUCTION Left    deltoid- [facility]   SKIN CANCER EXCISION Right 09/16/2018   shoulder    TONSILLECTOMY     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   Procedure: RIGHT TOTAL HIP ARTHROPLASTY ANTERIOR APPROACH;  Surgeon: Gaynelle Arabian, MD;  Location: [facility];  Service: Orthopedics;  Laterality: Right;   WISDOM TOOTH EXTRACTION     Social History   Socioeconomic History   Marital status: Married    Spouse name: Not on file   Number of children: 3   Years of education: Not on file   Highest education level: Not on file  Occupational History    Employer:  Funeral director's assistant  Tobacco Use   Smoking status: Former Smoker    Quit date: 11/02/1974    Years since quitting: 45.0   Smokeless tobacco: Never Used  Substance and Sexual Activity   Alcohol use: Yes    Alcohol/week: 1.0 - 2.0 standard drinks    Types: 1 - 2 Standard drinks or equivalent per week    Comment: 2-3 PER WEEK   Drug use: no    Types:    Sexual activity: Not on file  Other Topics Concern   Not on file  Social History  Narrative   Lives at home with wife.     Social Determinants of Health   Financial Resource Strain:    Difficulty of Paying Living Expenses: Not on file  Food Insecurity:    Worried About Charity fundraiser in the Last Year: Not on file   [employer] of Food in the Last Year: Not on file  Transportation Needs:    Lack of Transportation (Medical): Not on file   Lack of Transportation (Non-Medical): Not on file  Physical Activity:    Days of Exercise per Week: Not on file   Minutes of Exercise per Session: Not on file  Stress:    Feeling of Stress : Not on file  Social Connections:    Frequency of Communication with Friends and Family: Not on file   Frequency of Social Gatherings with Friends and Family: Not on file   Attends Religious Services: Not on file   Active Member of [place] or Organizations: Not on file   Attends Archivist Meetings: Not on file   Marital Status: Not on file  Intimate Partner Violence:    Fear of Current or Ex-Partner: Not on file   Emotionally Abused: Not on file   Physically Abused: Not on file   Sexually Abused: Not on file   Current Outpatient Medications on File Prior to Visit  Medication Sig Dispense Refill   albuterol (VENTOLIN HFA) 108 (90 Base) MCG/ACT inhaler TAKE 2 PUFFS BY MOUTH EVERY 6 HOURS AS NEEDED FOR WHEEZE OR SHORTNESS OF BREATH 8.5 each 0   aspirin 81 MG tablet Take by mouth daily.      benzonatate (TESSALON PERLES) 100 MG capsule Take 1 capsule (100 mg total) by mouth 3 (three) times daily as needed for cough. 20 capsule 0   Cholecalciferol (VITAMIN D) 2000 units CAPS Take 1 capsule by mouth daily.     FARXIGA 10 MG TABS tablet TAKE 1 TABLET BY MOUTH DAILY BEFORE BREAKFAST 90 tablet 1   glucose blood test strip Use as instructed -  one touch verio 100 each 3   ibuprofen (ADVIL) 200 MG tablet Take 600 mg by mouth every 6 (six) hours as needed.     icosapent Ethyl (VASCEPA) 1 g capsule Take 2 capsules (2 g total) by mouth 2 (two)  times daily. SCHEDULE CHECK UP 120 capsule 3   metFORMIN (GLUCOPHAGE-XR) 500 MG 24 hr tablet TAKE 2 TABLETS (1,000 MG TOTAL) BY MOUTH 2 (TWO) TIMES DAILY WITH A MEAL. 360 tablet 3   olmesartan (BENICAR) 20 MG tablet TAKE 1/2 TABLET BY MOUTH DAILY 45 tablet 3   OneTouch Delica Lancets 75F MISC Check 1x a day 100 each 3   promethazine-dextromethorphan (PROMETHAZINE-DM) 6.25-15 MG/5ML syrup Take 2.5 mLs by mouth 4 (four) times daily as needed for cough. 118 mL 0   rosuvastatin (CRESTOR) 40 MG tablet TAKE 1 TABLET BY MOUTH EVERY DAY 90 tablet 3   Semaglutide (RYBELSUS) 14 MG TABS Take 14 mg by mouth daily. 90 tablet 3   Vitamins/Minerals TABS Take by mouth.     No current facility-administered medications on file prior to visit.   Allergies  Allergen Reactions   Codeine     REACTION: agitation   Erythromycin     REACTION: diarrhea   Latex     rash   Other     Melon- itchy throat   Family History  Problem Relation Age of Onset   Cancer Sister        ovarian   Diabetes Sister    Thyroid disease Sister    Breast cancer Mother 89   Hypertension Mother    Cancer Mother    CAD Father 82       Died age 4 after CABG   Stroke Father    Hypertension Father    AAA (abdominal aortic aneurysm) Father    Hyperlipidemia Father    Heart disease Father     PE: There were no vitals taken for this visit. Wt Readings from Last 3 Encounters:  04/04/21 186 lb 9.6 oz (84.6 kg)  04/03/21 186 lb (84.4 kg)  02/12/21 190 lb 6.4 oz (86.4 kg)   Constitutional: overweight, in NAD Eyes: PERRLA, EOMI, no exophthalmos ENT: moist mucous membranes, no thyromegaly, no cervical lymphadenopathy Cardiovascular: RRR, No MRG Respiratory: CTA B Musculoskeletal: no deformities, strength intact in all 4 Skin: moist, warm, no rashes Neurological: no tremor with outstretched hands, DTR normal in all 4  ASSESSMENT: 1. DM2, non-insulin-dependent, uncontrolled, with complications - CAD - CKD  Component      Latest Ref Rng & Units 01/14/2021  Glucose     65 - 99 mg/dL 122 (H)  Hemoglobin A1C     4.0 - 5.6 % 7.0 (A)  C-Peptide     0.80 - 3.85 ng/mL 2.54  Islet Cell Ab     Neg:<1:1 Negative  ZNT8 Antibodies     <15 U/mL <10  Glutamic Acid Decarb Ab     <5 IU/mL <5  No signs of insulin deficiency or pancreatic autoimmunity.  2. HL  3. MNG  PLAN:  1. Patient with longstanding, uncontrolled, type 2 diabetes, on oral antidiabetic regimen with metformin, GLP-1 receptor agonist and SGLT2 inhibitor, with improved HbA1c at last visit, at 6.7%.  At that time, sugars were better, with occasional spikes in the 140s to 150s in the morning, and rarely higher.  Overall, due to the improvement in the blood sugars, we did not change his regimen.  I did suggest further changes in his diet including reducing  fatty foods.  We previously made sure that he did not have insulin deficiency and pancreatic autoimmunity. -At this visit, sugars are slightly higher than before, and especially in the morning, as he mentions that he has dinners late with his wife, who is working late.  I feel that if we improve his morning sugars, sugars later in the day will be better controlled so for now we discussed about adding a low-dose glipizide before dinner.  I did advise him to add only half of a 5 mg tablet, but we can increase this if needed.  We will continue the rest of the regimen for now. Wilder Glade and Rybelsus are expensive so I gave him coupons for these.  He has [employer]. - I suggested to:  Patient Instructions  Please continue: - Metformin ER 1000 mg 2x a day with meals - Farxiga 10 mg before b'fast - Rybelsus 14 mg before b'fast  Please add 2.5 mg Glipizide before dinner.  Please return in 3-4 months with your sugar log.   - we checked his HbA1c: 7.5% (higher) - advised to check sugars at different times of the day - 1x a day, rotating check times - advised for yearly eye exams >> he is  UTD - return to clinic in 3-4 months  2. HL -Reviewed latest lipid panel from 04/03/2021: Fractions at goal with the exception of a low HDL Lab Results  Component Value Date   CHOL 91 (L) 04/03/2021   HDL 27 (L) 04/03/2021   LDLCALC 41 04/03/2021   TRIG 125 04/03/2021   CHOLHDL 3.4 04/03/2021  -He continues on Crestor 40 mg daily and Vascepa 2 mg twice a day without side effects -At last visit we discussed about improving diet by reducing saturated fats and including more fiber  3. MNG -He denies neck compression symptoms -Previously investigated with ultrasound (in 2013-reviewed report) and also FNA (in 2019-reviewed report) and these were benign.  -He had another thyroid ultrasound obtained on 11/03/2019 which showed only 1 nodule measuring slightly less than 1 cm, the rest of the nodules were smaller -Latest TSH was normal: Lab Results  Component Value Date   TSH 1.95 10/27/2019  -No further follow-up is recommended for this  Philemon Kingdom, MD PhD [facility] Endocrinology

## 2021-10-03 NOTE — Patient Instructions (Addendum)
Please continue: - Metformin ER 1000 mg 2x a day with meals - Farxiga 10 mg before b'fast - Rybelsus 14 mg before b'fast  Please add 2.5 mg Glipizide before dinner.  Please return in 3-4 months with your sugar log.

## 2021-10-10 ENCOUNTER — Other Ambulatory Visit: Payer: Self-pay | Admitting: Family Medicine

## 2021-10-19 ENCOUNTER — Other Ambulatory Visit: Payer: Self-pay | Admitting: Internal Medicine

## 2021-10-20 ENCOUNTER — Other Ambulatory Visit: Payer: Self-pay

## 2021-10-20 MED ORDER — ROSUVASTATIN CALCIUM 40 MG PO TABS: 40.0000 mg | ORAL_TABLET | Freq: Every day | ORAL | 3 refills | Status: DC

## 2021-12-07 ENCOUNTER — Other Ambulatory Visit: Payer: Self-pay | Admitting: Family Medicine

## 2021-12-14 ENCOUNTER — Other Ambulatory Visit: Payer: Self-pay | Admitting: Endocrinology

## 2021-12-16 ENCOUNTER — Other Ambulatory Visit: Payer: Self-pay | Admitting: Internal Medicine

## 2021-12-18 ENCOUNTER — Ambulatory Visit (INDEPENDENT_AMBULATORY_CARE_PROVIDER_SITE_OTHER): Payer: BC Managed Care – PPO | Admitting: Family Medicine

## 2021-12-18 ENCOUNTER — Encounter: Payer: Self-pay | Admitting: Family Medicine

## 2021-12-18 VITALS — BP 119/73 | HR 108 | Ht 72.0 in | Wt 196.0 lb

## 2021-12-18 DIAGNOSIS — E1159 Type 2 diabetes mellitus with other circulatory complications: Secondary | ICD-10-CM

## 2021-12-18 DIAGNOSIS — E1165 Type 2 diabetes mellitus with hyperglycemia: Secondary | ICD-10-CM

## 2021-12-18 DIAGNOSIS — E782 Mixed hyperlipidemia: Secondary | ICD-10-CM

## 2021-12-18 DIAGNOSIS — E1169 Type 2 diabetes mellitus with other specified complication: Secondary | ICD-10-CM

## 2021-12-18 DIAGNOSIS — Z1211 Encounter for screening for malignant neoplasm of colon: Secondary | ICD-10-CM

## 2021-12-18 DIAGNOSIS — M25512 Pain in left shoulder: Secondary | ICD-10-CM

## 2021-12-18 DIAGNOSIS — I10 Essential (primary) hypertension: Secondary | ICD-10-CM

## 2021-12-18 LAB — BAYER DCA HB A1C WAIVED: HB A1C (BAYER DCA - WAIVED): 7 % — ABNORMAL HIGH (ref 4.8–5.6)

## 2021-12-18 NOTE — Patient Instructions (Signed)
Vaccines needed: ? ?Prevnar 20 (Pneumonia) ? ?Tdap ( Tetanus) ? ?Shingrix (Shingles) ?

## 2021-12-18 NOTE — Progress Notes (Signed)
? ?BP 119/73   Pulse (!) 108   Ht 6' (1.829 m)   Wt 196 lb (88.9 kg)   SpO2 96%   BMI 26.58 kg/m?   ? ?Subjective:  ? ?Patient ID: Austin Graham, male    DOB: 1951-04-27, 71 y.o.   MRN: [id number] ? ?HPI: ?Austin Graham is a 71 y.o. male presenting on 12/18/2021 for Medical Management of Chronic Issues, Diabetes, and Hyperlipidemia ? ? ?HPI ?Type 2 diabetes mellitus ?Patient comes in today for recheck of his diabetes. Patient has been currently taking farxiga and metformin and glipizide and rybelsus. Patient is currently on an ACE inhibitor/ARB. Patient has not seen an ophthalmologist this year. Patient denies any issues with their feet. The symptom started onset as an adult hypertension and hyperlipidemia ARE RELATED TO DM  ? ?Hypertension ?Patient is currently on olmesartan, and their blood pressure today is 119/73. Patient denies any lightheadedness or dizziness. Patient denies headaches, blurred vision, chest pains, shortness of breath, or weakness. Denies any side effects from medication and is content with current medication.  ? ?Hyperlipidemia ?Patient is coming in for recheck of his hyperlipidemia. The patient is currently taking Crestor. They deny any issues with myalgias or history of liver damage from it. They deny any focal numbness or weakness or chest pain.  ? ?Patient is coming in complaining of left shoulder pain.  He feels like its been bothering him since about Thanksgiving till about 5 or 6 months now.  He says he had a lot of golfing that weekend and ever since then he just feels like is not right.  He cannot sleep on that side.  He has been taking Advil and Aleve around-the-clock and it does not seem to be improving.  He says he can barely reach up to touch the back of his head to wash the back of his head and he cannot sleep on that side.  He saw [facility] in the past for his other shoulder and wants to go see them. ? ?Relevant past medical, surgical, family and social history  reviewed and updated as indicated. Interim medical history since our last visit reviewed. ?Allergies and medications reviewed and updated. ? ?Review of Systems  ?Constitutional:  Negative for chills and fever.  ?Eyes:  Negative for visual disturbance.  ?Respiratory:  Negative for shortness of breath and wheezing.   ?Cardiovascular:  Negative for chest pain and leg swelling.  ?Musculoskeletal:  Positive for arthralgias. Negative for back pain and gait problem.  ?Skin:  Negative for rash.  ?Neurological:  Negative for dizziness, weakness and light-headedness.  ?All other systems reviewed and are negative. ? ?Per HPI unless specifically indicated above ? ? ?Allergies as of 12/18/2021   ? ?   Reactions  ? Codeine   ? REACTION: agitation  ? Erythromycin   ? REACTION: diarrhea  ? Latex   ? rash  ? Other   ? Melon- itchy throat  ? ?  ? ?  ?Medication List  ?  ? ?  ? Accurate as of December 18, 2021  4:24 PM. If you have any questions, ask your nurse or doctor.  ?  ?  ? ?  ? ?STOP taking these medications   ? ?albuterol 108 (90 Base) MCG/ACT inhaler ?Commonly known as: VENTOLIN HFA ?Stopped by: Worthy Rancher, MD ?  ?promethazine-dextromethorphan 6.25-15 MG/5ML syrup ?Commonly known as: PROMETHAZINE-DM ?Stopped by: Worthy Rancher, MD ?  ? ?  ? ?TAKE these medications   ? ?aspirin 81 MG  tablet ?Take by mouth daily. ?  ?Farxiga 10 MG Tabs tablet ?Generic drug: dapagliflozin propanediol ?TAKE 1 TABLET BY MOUTH EVERY DAY BEFORE BREAKFAST ?  ?glipiZIDE 5 MG tablet ?Commonly known as: GLUCOTROL ?Take 0.5 tablets (2.5 mg total) by mouth daily before supper. ?  ?glucose blood test strip ?Use as instructed - one touch verio ?  ?ibuprofen 200 MG tablet ?Commonly known as: ADVIL ?Take 600 mg by mouth every 6 (six) hours as needed. ?  ?icosapent Ethyl 1 g capsule ?Commonly known as: Vascepa ?Take 2 capsules (2 g total) by mouth 2 (two) times daily. SCHEDULE CHECK UP ?  ?metFORMIN 500 MG 24 hr tablet ?Commonly known as:  GLUCOPHAGE-XR ?TAKE 2 TABLETS (1,000 MG TOTAL) BY MOUTH 2 (TWO) TIMES DAILY WITH A MEAL. ?  ?olmesartan 20 MG tablet ?Commonly known as: BENICAR ?TAKE 1/2 TABLET BY MOUTH DAILY ?  ?OneTouch Delica Lancets 74Q Misc ?Check 1x a day ?  ?rosuvastatin 40 MG tablet ?Commonly known as: CRESTOR ?Take 1 tablet (40 mg total) by mouth daily. ?  ?Rybelsus 14 MG Tabs ?Generic drug: Semaglutide ?TAKE 1 TABLET (14MG) BY MOUTH ONCE DAILY ?  ?Vitamin D 50 MCG (2000 UT) Caps ?Take 1 capsule by mouth daily. ?  ?Vitamins/Minerals Tabs ?Take by mouth. ?  ? ?  ? ? ? ?Objective:  ? ?BP 119/73   Pulse (!) 108   Ht 6' (1.829 m)   Wt 196 lb (88.9 kg)   SpO2 96%   BMI 26.58 kg/m?   ?Wt Readings from Last 3 Encounters:  ?12/18/21 196 lb (88.9 kg)  ?10/03/21 193 lb 3.2 oz (87.6 kg)  ?04/04/21 186 lb 9.6 oz (84.6 kg)  ?  ?Physical Exam ?Vitals and nursing note reviewed.  ?Constitutional:   ?   General: He is not in acute distress. ?   Appearance: He is well-developed. He is not diaphoretic.  ?Eyes:  ?   General: No scleral icterus. ?   Conjunctiva/sclera: Conjunctivae normal.  ?Neck:  ?   Thyroid: No thyromegaly.  ?Cardiovascular:  ?   Rate and Rhythm: Normal rate and regular rhythm.  ?   Heart sounds: Normal heart sounds. No murmur heard. ?Pulmonary:  ?   Effort: Pulmonary effort is normal. No respiratory distress.  ?   Breath sounds: Normal breath sounds. No wheezing.  ?Musculoskeletal:     ?   General: Tenderness (Pain with range of motion of the left shoulder, pain in all directions) present. No swelling, deformity or signs of injury. Normal range of motion.  ?   Cervical back: Neck supple.  ?Lymphadenopathy:  ?   Cervical: No cervical adenopathy.  ?Skin: ?   General: Skin is warm and dry.  ?   Findings: No rash.  ?Neurological:  ?   Mental Status: He is alert and oriented to person, place, and time.  ?   Coordination: Coordination normal.  ?Psychiatric:     ?   Behavior: Behavior normal.  ? ? ? ? ?Assessment & Plan:  ? ?Problem List  Items Addressed This Visit   ? ?  ? Cardiovascular and Mediastinum  ? Hypertension  ? Relevant Orders  ? CBC with Differential/Platelet  ? CMP14+EGFR  ? Lipid panel  ? Bayer DCA Hb A1c Waived  ?  ? Endocrine  ? Diabetes mellitus ([place]) - Primary  ? Relevant Orders  ? CBC with Differential/Platelet  ? CMP14+EGFR  ? Lipid panel  ? Bayer DCA Hb A1c Waived  ?  ? Other  ?  Hyperlipidemia  ? Relevant Orders  ? CBC with Differential/Platelet  ? CMP14+EGFR  ? Lipid panel  ? Bayer DCA Hb A1c Waived  ? ?Other Visit Diagnoses   ? ? Colon cancer screening      ? Relevant Orders  ? Ambulatory referral to Gastroenterology  ? Acute pain of left shoulder      ? Relevant Orders  ? Ambulatory referral to Orthopedic Surgery  ? ?  ?  ?Refer for colonoscopy and to [facility]. ? ?We will check blood work today. ? ?No change in medication or treatment today but will monitor closely, discussed extensively diet and exercise for diabetes. ?Follow up plan: ?Return in about 6 months (around 06/20/2022), or if symptoms worsen or fail to improve, for Hypertension and hyperlipidemia. ? ?Counseling provided for all of the vaccine components ?Orders Placed This Encounter  ?Procedures  ? CBC with Differential/Platelet  ? CMP14+EGFR  ? Lipid panel  ? Bayer DCA Hb A1c Waived  ? Ambulatory referral to Gastroenterology  ? Ambulatory referral to Orthopedic Surgery  ? ? ?Caryl Pina, MD ?[place] ?12/18/2021, 4:24 PM ? ? ? ? ?

## 2021-12-19 ENCOUNTER — Other Ambulatory Visit: Payer: Self-pay | Admitting: Family Medicine

## 2021-12-19 LAB — CBC WITH DIFFERENTIAL/PLATELET
Basophils Absolute: 0.1 10*3/uL (ref 0.0–0.2)
Basos: 1 %
EOS (ABSOLUTE): 0.2 10*3/uL (ref 0.0–0.4)
Eos: 3 %
Hematocrit: 40.9 % (ref 37.5–51.0)
Hemoglobin: 13.5 g/dL (ref 13.0–17.7)
Immature Grans (Abs): 0 10*3/uL (ref 0.0–0.1)
Immature Granulocytes: 0 %
Lymphocytes Absolute: 1.8 10*3/uL (ref 0.7–3.1)
Lymphs: 27 %
MCH: 30.3 pg (ref 26.6–33.0)
MCHC: 33 g/dL (ref 31.5–35.7)
MCV: 92 fL (ref 79–97)
Monocytes Absolute: 0.4 10*3/uL (ref 0.1–0.9)
Monocytes: 6 %
Neutrophils Absolute: 4.3 10*3/uL (ref 1.4–7.0)
Neutrophils: 63 %
Platelets: 250 10*3/uL (ref 150–450)
RBC: 4.46 x10E6/uL (ref 4.14–5.80)
RDW: 13.1 % (ref 11.6–15.4)
WBC: 6.8 10*3/uL (ref 3.4–10.8)

## 2021-12-19 LAB — LIPID PANEL
Chol/HDL Ratio: 3 ratio (ref 0.0–5.0)
Cholesterol, Total: 88 mg/dL — ABNORMAL LOW (ref 100–199)
HDL: 29 mg/dL — ABNORMAL LOW (ref >39)
LDL Chol Calc (NIH): 23 mg/dL (ref 0–99)
Triglycerides: 233 mg/dL — ABNORMAL HIGH (ref 0–149)
VLDL Cholesterol Cal: 36 mg/dL (ref 5–40)

## 2021-12-19 LAB — CMP14+EGFR
ALT: 27 IU/L (ref 0–44)
AST: 22 IU/L (ref 0–40)
Albumin/Globulin Ratio: 3.1 — ABNORMAL HIGH (ref 1.2–2.2)
Albumin: 4.7 g/dL (ref 3.8–4.8)
Alkaline Phosphatase: 98 IU/L (ref 44–121)
BUN/Creatinine Ratio: 14 (ref 10–24)
BUN: 18 mg/dL (ref 8–27)
Bilirubin Total: 0.7 mg/dL (ref 0.0–1.2)
CO2: 21 mmol/L (ref 20–29)
Calcium: 9.8 mg/dL (ref 8.6–10.2)
Chloride: 101 mmol/L (ref 96–106)
Creatinine, Ser: 1.33 mg/dL — ABNORMAL HIGH (ref 0.76–1.27)
Globulin, Total: 1.5 g/dL (ref 1.5–4.5)
Glucose: 275 mg/dL — ABNORMAL HIGH (ref 70–99)
Potassium: 4.5 mmol/L (ref 3.5–5.2)
Sodium: 137 mmol/L (ref 134–144)
Total Protein: 6.2 g/dL (ref 6.0–8.5)
eGFR: 58 mL/min/{1.73_m2} — ABNORMAL LOW (ref >59)

## 2021-12-23 ENCOUNTER — Telehealth: Payer: Self-pay

## 2021-12-23 NOTE — Telephone Encounter (Signed)
(  Key: [id number]) ? ?This request has received an approval. View the bottom of the request for an electronic copy of the approval letter. ? ?[facility] has approved Vascepa 1g capsule from 12/21/2021-12/22/2024. ? ?[facility] in [place] made aware. ? ?

## 2022-02-05 ENCOUNTER — Encounter: Payer: Self-pay | Admitting: Internal Medicine

## 2022-02-06 ENCOUNTER — Ambulatory Visit: Payer: BC Managed Care – PPO | Admitting: Internal Medicine

## 2022-02-06 NOTE — Progress Notes (Deleted)
Patient ID: Austin Graham, male   DOB: 1951-04-26, 71 y.o.   MRN: [id number]   This visit occurred during the SARS-CoV-2 public health emergency.  Safety protocols were in place, including screening questions prior to the visit, additional usage of staff PPE, and extensive cleaning of exam room while observing appropriate contact time as indicated for disinfecting solutions.   HPI: Austin Graham is a 71 y.o.-year-old male, initially referred by his cardiologist, Dr. Percival Spanish, returning for follow-up for DM2, dx in ~2013, non-insulin-dependent, uncontrolled, with complications (CAD, CKD) and also thyroid nodules.Last visit 4 months ago.  Interim history: No blurry vision, nausea, chest pain.  Previously: I saw the patient first in 09/2019.  At that time, we stopped Janumet and started metformin ER, we increased Rybelsus dose and we added [place].  He developed diarrhea, indigestion, loss of appetite, nausea, after the visit, and he stopped all of his medicines.  The symptoms resolved and I advised him to start adding back the medicines one by one.  However, he was lost for follow-up afterwards and returned to see me in 12/2020.  Reviewed latest HbA1c levels: Lab Results  Component Value Date   HGBA1C 7.0 (H) 12/18/2021   HGBA1C 7.5 (A) 10/03/2021   HGBA1C 6.7 (H) 04/03/2021   HGBA1C 7.0 (A) 01/14/2021   HGBA1C 8.7 (H) 07/19/2020   HGBA1C 7.5 (H) 01/18/2020   HGBA1C 8.9 (A) 10/27/2019   HGBA1C 7.5 (H) 07/07/2019   HGBA1C 7.2 (H) 03/27/2019   HGBA1C 9.1 (H) 11/11/2018   At last visit he was on: - JanuMet ER 50-1000 mg 2x a day, with meals - Rybelsus 3 mg daily before breakfast Tried Trulicity >> nausea, lightheadedness.  We changed to: - Metformin ER 1000 mg 2x a day with meals (L and D) - Rybelsus 7 >> 14 mg daily-not tolerating well - Farxiga 10 mg before b'fast  Pt checks his sugars 1-2x a day and they are: - am: 120, 135-172, 207 >> 98-175, 189 >> 115-184 - 2h after  b'fast: 128-292 >> 113-139, 167 >> 151-196 - before lunch: n/c >> 139, 180, 248 >> 128 >> 101, 144 - 2h after lunch: 122-268, 308 >> 90-127 >> 168-196 - before dinner: n/c >> 112 - 2h after dinner: n/c >> 118, 134 >> 106 >> 114-163 - bedtime: 180-220 >> n/c >> 164 - nighttime: n/c >> 114, 153 Lowest sugar was 138 >> 120 >> 98 >> 101; ? hypoglycemia awareness..  Highest sugar was 303 >> 308 >> 189 >> 196.  Glucometer: One Touch ultra  Pt's meals are: - Breakfast: oatmeal, cereals, granola - Lunch: sandwich on rye or whole wheat; bacon and eggs - Dinner: vegetable, potatoes, meat or pasta - Snacks: 2-3 -stopped sweets  - + mild CKD, last BUN/creatinine:  Lab Results  Component Value Date   BUN 18 12/18/2021   BUN 25 07/19/2020   CREATININE 1.33 (H) 12/18/2021   CREATININE 1.26 07/19/2020  On Benicar 20.  -+ HL; last set of lipids: Lab Results  Component Value Date   CHOL 88 (L) 12/18/2021   HDL 29 (L) 12/18/2021   LDLCALC 23 12/18/2021   TRIG 233 (H) 12/18/2021   CHOLHDL 3.0 12/18/2021  On Crestor 40, Vascepa.  - last eye exam was on 09/19/2021: No DR.   - no numbness and tingling in his feet. He has a foor sput - plantar fasciitis.  Pt has FH of DM in F, S, GM.  He also has a history of HTN, GERD, multinodular  goiter.  Thyroid ultrasound (07/05/2012) showed very small nodules, stable: Right thyroid lobe:  5.0 x 1.7 x 2.3 cm. Left thyroid lobe:  5.6 x 1.9 x 2.4 cm. Isthmus:   Focal nodules:  Multiple bilateral small thyroid nodules.  The largest on the right is 10 mm in the lower pole compared 13 mm previously.  The largest on the left is 10 mm in the mid pole compared to 9 mm on the previous study.  No significant change.  No dominant nodule.   Lymphadenopathy:   None.   IMPRESSION: Numerous bilateral small thyroid nodules, none significantly changed.  He saw Dr. Dalbert Batman in the past and had FNAs:   Date Taken: 05/10/2008   Satisfactory for evaluation.     INTERPRETATION(S):   THYROID, LEFT, FINE NEEDLE ASPIRATION (THIN PREP, SMEARS BLOCK):   - FINDINGS CONSISTENT WITH NON-NEOPLASTIC GOITER.   - SEE COMMENT.   Date Taken: 05/10/2008  Satisfactory for evaluation.    INTERPRETATION(S):   THYROID, RIGHT, FINE NEEDLE ASPIRATION (THIN PREP, SMEARS BLOCK):   - FINDINGS CONSISTENT WITH NON-NEOPLASTIC GOITER.   - SEE COMMENT.   Thyroid ultrasound (11/03/2019): Parenchymal Echotexture: Moderately heterogenous Isthmus: 0.4 cm thickness, stable Right lobe: 5.6 x 1.7 x 2.1 cm, previously 5.3 x 2.1 x 2.1 Left lobe: 4.9 x 2 x 1.9 cm, previously 5.4 x 2.2 x 1.7 _____________________________________________________________   Nodule # 1: Prior biopsy: No Location: Right; Inferior Maximum size: 0.98 cm; Other 2 dimensions: 0.8 x 0.8 cm, previously, 0.7 x 0.6 x 0.6 cm  Composition: mixed cystic and solid (1)  Echogenicity: hypoechoic (2) Significant change in size (>/= 20% in two dimensions and minimal increase of 2 mm): Yes *Given size (<1.4 cm) and appearance, this nodule does NOT meet TI-RADS criteria for biopsy or dedicated follow-up. ________________________________________________________   0.6 cm mixed solid/cystic inferior left nodule, previously 0.7; This nodule does NOT meet TI-RADS criteria for biopsy or dedicated follow-up.   Additional scattered hypoechoic lesions throughout both lobes all less than 0.5 cm diameter.   IMPRESSION: 1. Stable mild thyromegaly with scattered subcentimeter nodules. None meet criteria for biopsy or dedicated imaging follow-up.  Reviewed latest TSH: Lab Results  Component Value Date   TSH 1.95 10/27/2019   TSH 2.710 02/10/2018   TSH 3.820 04/01/2016   Pt denies: - feeling nodules in neck - hoarseness - dysphagia - choking - SOB with lying down  No FH of thyroid cancer. No h/o radiation tx to head or neck. +FH of thyroid ds in sister, GM  He also has BPH with nocturia.  ROS: + See HPI, +  nocturia, + joint aches, + occasional acid reflux  I reviewed pt's medications, allergies, PMH, social hx, family hx, and changes were documented in the history of present illness. Otherwise, unchanged from my initial visit note.   Past Medical History:  Diagnosis Date   Arthritis    Asthma    as child   Complication of anesthesia    hard to waken   Diabetes ([place])    type II   Diverticulosis    Dysrhythmia    PAC's   Goiter    History of hiatal hernia    Hyperlipidemia    Hypertension    Kidney stones    Stress fracture    left knee, right hip   Thyroid disease    Past Surgical History:  Procedure Laterality Date   CYSTOSCOPY WITH INSERTION OF UROLIFT     JOINT REPLACEMENT  KIDNEY STONES  2006   SKIN CANCER DESTRUCTION Left    deltoid- [facility]   SKIN CANCER EXCISION Right 09/16/2018   shoulder    TONSILLECTOMY     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   Procedure: RIGHT TOTAL HIP ARTHROPLASTY ANTERIOR APPROACH;  Surgeon: Gaynelle Arabian, MD;  Location: [facility];  Service: Orthopedics;  Laterality: Right;   WISDOM TOOTH EXTRACTION     Social History   Socioeconomic History   Marital status: Married    Spouse name: Not on file   Number of children: 3   Years of education: Not on file   Highest education level: Not on file  Occupational History    Employer:  Funeral director's assistant  Tobacco Use   Smoking status: Former Smoker    Quit date: 11/02/1974    Years since quitting: 45.0   Smokeless tobacco: Never Used  Substance and Sexual Activity   Alcohol use: Yes    Alcohol/week: 1.0 - 2.0 standard drinks    Types: 1 - 2 Standard drinks or equivalent per week    Comment: 2-3 PER WEEK   Drug use: no    Types:    Sexual activity: Not on file  Other Topics Concern   Not on file  Social History Narrative   Lives at home with wife.     Social Determinants of Health   Financial Resource Strain:    Difficulty of Paying Living Expenses: Not on file  Food  Insecurity:    Worried About Charity fundraiser in the Last Year: Not on file   [employer] of Food in the Last Year: Not on file  Transportation Needs:    Lack of Transportation (Medical): Not on file   Lack of Transportation (Non-Medical): Not on file  Physical Activity:    Days of Exercise per Week: Not on file   Minutes of Exercise per Session: Not on file  Stress:    Feeling of Stress : Not on file  Social Connections:    Frequency of Communication with Friends and Family: Not on file   Frequency of Social Gatherings with Friends and Family: Not on file   Attends Religious Services: Not on file   Active Member of Clubs or Organizations: Not on file   Attends Archivist Meetings: Not on file   Marital Status: Not on file  Intimate Partner Violence:    Fear of Current or Ex-Partner: Not on file   Emotionally Abused: Not on file   Physically Abused: Not on file   Sexually Abused: Not on file   Current Outpatient Medications on File Prior to Visit  Medication Sig Dispense Refill   aspirin 81 MG tablet Take by mouth daily.      Cholecalciferol (VITAMIN D) 2000 units CAPS Take 1 capsule by mouth daily.     FARXIGA 10 MG TABS tablet TAKE 1 TABLET BY MOUTH EVERY DAY BEFORE BREAKFAST 90 tablet 1   glipiZIDE (GLUCOTROL) 5 MG tablet Take 0.5 tablets (2.5 mg total) by mouth daily before supper. 45 tablet 3   glucose blood test strip Use as instructed - one touch verio 100 each 3   ibuprofen (ADVIL) 200 MG tablet Take 600 mg by mouth every 6 (six) hours as needed.     metFORMIN (GLUCOPHAGE-XR) 500 MG 24 hr tablet TAKE 2 TABLETS (1,000 MG TOTAL) BY MOUTH 2 (TWO) TIMES DAILY WITH A MEAL. 360 tablet 3   olmesartan (BENICAR) 20 MG tablet TAKE 1/2 TABLET  BY MOUTH DAILY 45 tablet 3   OneTouch Delica Lancets 09Q MISC Check 1x a day 100 each 3   rosuvastatin (CRESTOR) 40 MG tablet Take 1 tablet (40 mg total) by mouth daily. 90 tablet 3   RYBELSUS 14 MG TABS TAKE 1 TABLET ('14MG'$ ) BY MOUTH  ONCE DAILY 90 tablet 0   VASCEPA 1 g capsule TAKE 2 CAPSULES (2 G TOTAL) BY MOUTH 2 (TWO) TIMES DAILY. SCHEDULE CHECK UP 120 capsule 3   Vitamins/Minerals TABS Take by mouth.     No current facility-administered medications on file prior to visit.   Allergies  Allergen Reactions   Codeine     REACTION: agitation   Erythromycin     REACTION: diarrhea   Latex     rash   Other     Melon- itchy throat   Family History  Problem Relation Age of Onset   Cancer Sister        ovarian   Diabetes Sister    Thyroid disease Sister    Breast cancer Mother 75   Hypertension Mother    Cancer Mother    CAD Father 20       Died age 48 after CABG   Stroke Father    Hypertension Father    AAA (abdominal aortic aneurysm) Father    Hyperlipidemia Father    Heart disease Father     PE: There were no vitals taken for this visit. Wt Readings from Last 3 Encounters:  12/18/21 196 lb (88.9 kg)  10/03/21 193 lb 3.2 oz (87.6 kg)  04/04/21 186 lb 9.6 oz (84.6 kg)   Constitutional: overweight, in NAD Eyes: PERRLA, EOMI, no exophthalmos ENT: moist mucous membranes, no thyromegaly, no cervical lymphadenopathy Cardiovascular: RRR, No MRG Respiratory: CTA B Musculoskeletal: no deformities, strength intact in all 4 Skin: moist, warm, no rashes Neurological: no tremor with outstretched hands, DTR normal in all 4  ASSESSMENT: 1. DM2, non-insulin-dependent, uncontrolled, with complications - CAD - CKD  Component     Latest Ref Rng & Units 01/14/2021  Glucose     65 - 99 mg/dL 122 (H)  Hemoglobin A1C     4.0 - 5.6 % 7.0 (A)  C-Peptide     0.80 - 3.85 ng/mL 2.54  Islet Cell Ab     Neg:<1:1 Negative  ZNT8 Antibodies     <15 U/mL <10  Glutamic Acid Decarb Ab     <5 IU/mL <5  No signs of insulin deficiency or pancreatic autoimmunity.  2. HL  3. MNG  PLAN:  1. Patient with longstanding, uncontrolled, type 2 diabetes, on oral antidiabetic regimen with metformin, GLP-1 receptor agonist  and SGLT2 inhibitor, with worsening of his blood sugars at last visit, when an HbA1c was 7.5%.  At that time, he was having dinner as late with his wife, who was working late.  Since sugars were high after this, I advised him to add a low-dose glipizide before dinner.  We discussed about taking only half of a 5 mg tablet and increase as needed.  We continued the rest of the regimen.  At that time, Wilder Glade and Rybelsus were expensive so I gave him coupons for these. -He had another HbA1c approximately 1.5 months ago and this was lower, at 7.0%.  - I suggested to:  Patient Instructions  Please continue: - Metformin ER 1000 mg 2x a day with meals - Glipizide 2.5 mg before dinner - Farxiga 10 mg before b'fast - Rybelsus 14 mg before b'fast  Please return in 3-4 months with your sugar log.   - advised to check sugars at different times of the day - 1x a day, rotating check times - advised for yearly eye exams >> he is UTD - return to clinic in 3-4 months  2. HL -Reviewed latest lipid panel from 11/2021: LDL at goal, HDL low, triglycerides elevated: Lab Results  Component Value Date   CHOL 88 (L) 12/18/2021   HDL 29 (L) 12/18/2021   LDLCALC 23 12/18/2021   TRIG 233 (H) 12/18/2021   CHOLHDL 3.0 12/18/2021  -He continues on Crestor 40 mg daily and Vascepa 2 mg twice a day without side effects -We discussed at previous visit about improving diet by reducing saturated fats and including more fiber  3. MNG -No neck compression symptoms -Previously investigated with ultrasound (in 2013-reviewed report) and also FNA (in 2019-reviewed report) and these were benign -He had another thyroid ultrasound on 11/03/2019 which showed only 1 nodule measuring slightly less than 1 cm, with the rest of the nodule smaller -Latest TSH is normal Lab Results  Component Value Date   TSH 1.95 10/27/2019  - No further follow-up is recommended for this  Philemon Kingdom, MD PhD [facility] Endocrin[facility]

## 2022-03-06 ENCOUNTER — Ambulatory Visit (AMBULATORY_SURGERY_CENTER): Payer: BC Managed Care – PPO | Admitting: *Deleted

## 2022-03-06 VITALS — Ht 72.0 in | Wt 194.2 lb

## 2022-03-06 DIAGNOSIS — Z1211 Encounter for screening for malignant neoplasm of colon: Secondary | ICD-10-CM

## 2022-03-06 MED ORDER — NA SULFATE-K SULFATE-MG SULF 17.5-3.13-1.6 GM/177ML PO SOLN: 2.0000 | Freq: Once | ORAL | 0 refills | Status: AC

## 2022-03-06 NOTE — Progress Notes (Signed)
No egg or soy allergy known to patient  No issues known to pt with past sedation with any surgeries or procedures Patient denies ever being told they had issues or difficulty with intubation  No FH of Malignant Hyperthermia Pt is not on diet pills Pt is not on  home 02  Pt is not on blood thinners  Pt denies issues with constipation  No A fib or A flutter   Pt instructed to use Singlecare.com or GoodRx for a price reduction on prep   PV completed in person. Pt verified name, DOB, address and insurance during PV today.  Pt given instruction packet with  consent form to read and sign after procedure and instructions explained and questions answered. Pt encouraged to call with questions or issues.  If pt has My chart, procedure instructions sent via My Chart

## 2022-03-16 ENCOUNTER — Other Ambulatory Visit: Payer: Self-pay | Admitting: Internal Medicine

## 2022-03-20 ENCOUNTER — Other Ambulatory Visit: Payer: Self-pay | Admitting: Internal Medicine

## 2022-03-24 ENCOUNTER — Encounter: Payer: Self-pay | Admitting: Internal Medicine

## 2022-03-27 ENCOUNTER — Encounter: Payer: Self-pay | Admitting: Internal Medicine

## 2022-03-27 ENCOUNTER — Ambulatory Visit (AMBULATORY_SURGERY_CENTER): Payer: BC Managed Care – PPO | Admitting: Internal Medicine

## 2022-03-27 VITALS — BP 111/54 | HR 88 | Temp 98.0°F | Resp 17 | Ht 72.0 in | Wt 194.0 lb

## 2022-03-27 DIAGNOSIS — D122 Benign neoplasm of ascending colon: Secondary | ICD-10-CM

## 2022-03-27 DIAGNOSIS — Z1211 Encounter for screening for malignant neoplasm of colon: Secondary | ICD-10-CM | POA: Diagnosis present

## 2022-03-27 DIAGNOSIS — D124 Benign neoplasm of descending colon: Secondary | ICD-10-CM

## 2022-03-27 MED ORDER — SODIUM CHLORIDE 0.9 % IV SOLN: 500.0000 mL | Freq: Once | INTRAVENOUS | Status: DC

## 2022-03-27 NOTE — Progress Notes (Signed)
Called to room to assist during endoscopic procedure.  Patient ID and intended procedure confirmed with present staff. Received instructions for my participation in the procedure from the performing physician.  

## 2022-03-27 NOTE — Progress Notes (Signed)
Pt's states no medical or surgical changes since previsit or office visit. 

## 2022-03-27 NOTE — Op Note (Signed)
[place] Patient Name: Austin Graham Procedure Date: 03/27/2022 1:56 PM MRN: [id number] Endoscopist: Jerene Bears , MD Age: 71 Referring MD:  Date of Birth: 10/08/1950 Gender: Male Account #: [id number] Procedure:                Colonoscopy Indications:              Screening for colorectal malignant neoplasm, Last                            colonoscopy: 2011 Medicines:                Monitored Anesthesia Care Procedure:                Pre-Anesthesia Assessment:                           - Prior to the procedure, a History and Physical                            was performed, and patient medications and                            allergies were reviewed. The patient's tolerance of                            previous anesthesia was also reviewed. The risks                            and benefits of the procedure and the sedation                            options and risks were discussed with the patient.                            All questions were answered, and informed consent                            was obtained. Prior Anticoagulants: The patient has                            taken no previous anticoagulant or antiplatelet                            agents. ASA Grade Assessment: III - A patient with                            severe systemic disease. After reviewing the risks                            and benefits, the patient was deemed in                            satisfactory condition to undergo the procedure.  After obtaining informed consent, the colonoscope                            was passed under direct vision. Throughout the                            procedure, the patient's blood pressure, pulse, and                            oxygen saturations were monitored continuously. The                            CF [id number] #[id number] was introduced through the anus                            and advanced to the cecum, identified  by                            appendiceal orifice and ileocecal valve. The                            colonoscopy was performed without difficulty. The                            patient tolerated the procedure well. The quality                            of the bowel preparation was good. The ileocecal                            valve, appendiceal orifice, and rectum were                            photographed. Scope In: 2:02:25 PM Scope Out: 2:18:41 PM Scope Withdrawal Time: 0 hours 13 minutes 49 seconds  Total Procedure Duration: 0 hours 16 minutes 16 seconds  Findings:                 The digital rectal exam was normal.                           A 7 mm polyp was found in the ascending colon. The                            polyp was sessile. The polyp was removed with a                            cold snare. Resection and retrieval were complete.                           Two sessile polyps were found in the descending                            colon. The polyps were 5 to 6 mm  in size. These                            polyps were removed with a cold snare. Resection                            and retrieval were complete.                           Internal hemorrhoids were found during                            retroflexion. The hemorrhoids were small. Complications:            No immediate complications. Estimated Blood Loss:     Estimated blood loss was minimal. Impression:               - One 7 mm polyp in the ascending colon, removed                            with a cold snare. Resected and retrieved.                           - Two 5 to 6 mm polyps in the descending colon,                            removed with a cold snare. Resected and retrieved.                           - Small internal hemorrhoids. Recommendation:           - Patient has a contact number available for                            emergencies. The signs and symptoms of potential                             delayed complications were discussed with the                            patient. Return to normal activities tomorrow.                            Written discharge instructions were provided to the                            patient.                           - Resume previous diet.                           - Continue present medications.                           - Await pathology results.                           -  Repeat colonoscopy is recommended. The                            colonoscopy date will be determined after pathology                            results from today's exam become available for                            review. Jerene Bears, MD 03/27/2022 2:22:00 PM This report has been signed electronically.

## 2022-03-27 NOTE — Progress Notes (Signed)
GASTROENTEROLOGY PROCEDURE H&P NOTE   Primary Care Physician: Dettinger, Fransisca Kaufmann, MD    Reason for Procedure:  Colon cancer screening  Plan:    Colonoscopy  Patient is appropriate for endoscopic procedure(s) in the ambulatory ([place]) setting.  The nature of the procedure, as well as the risks, benefits, and alternatives were carefully and thoroughly reviewed with the patient. Ample time for discussion and questions allowed. The patient understood, was satisfied, and agreed to proceed.     HPI: Austin Graham is a 71 y.o. male who presents for colonoscopy.  Medical history as below.  Tolerated the prep.  No recent chest pain or shortness of breath.  No abdominal pain today.  Past Medical History:  Diagnosis Date   Arthritis    Asthma    as child   Complication of anesthesia    hard to waken   Diabetes ([place])    type II   Diverticulosis    Dysrhythmia    PAC's   Goiter    History of hiatal hernia    Hyperlipidemia    Hypertension    Kidney stones    Stress fracture    left knee, right hip   Thyroid disease     Past Surgical History:  Procedure Laterality Date   CYSTOSCOPY WITH INSERTION OF UROLIFT     JOINT REPLACEMENT     KIDNEY STONES  2006   SKIN CANCER DESTRUCTION Left    deltoid- [facility]   SKIN CANCER EXCISION Right 09/16/2018   shoulder    TONSILLECTOMY     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   Procedure: RIGHT TOTAL HIP ARTHROPLASTY ANTERIOR APPROACH;  Surgeon: Gaynelle Arabian, MD;  Location: [facility];  Service: Orthopedics;  Laterality: Right;   WISDOM TOOTH EXTRACTION      Prior to Admission medications   Medication Sig Start Date End Date Taking? Authorizing Provider  aspirin 81 MG tablet Take by mouth daily.    Yes [provider]  Cholecalciferol (VITAMIN D) 2000 units CAPS Take 1 capsule by mouth daily.   Yes [provider]  FARXIGA 10 MG TABS tablet TAKE 1 TABLET BY MOUTH EVERY DAY BEFORE BREAKFAST 12/15/21  Yes Philemon Kingdom, MD  glipiZIDE (GLUCOTROL) 5 MG tablet Take 0.5 tablets (2.5 mg total) by mouth daily before supper. 10/03/21  Yes Philemon Kingdom, MD  ibuprofen (ADVIL) 200 MG tablet Take 600 mg by mouth every 6 (six) hours as needed.   Yes [provider]  metFORMIN (GLUCOPHAGE-XR) 500 MG 24 hr tablet TAKE 2 TABLETS (1,000 MG TOTAL) BY MOUTH 2 (TWO) TIMES DAILY WITH A MEAL. 10/20/21  Yes Philemon Kingdom, MD  olmesartan (BENICAR) 20 MG tablet TAKE 1/2 TABLET BY MOUTH DAILY 07/25/21  Yes Dettinger, Fransisca Kaufmann, MD  OneTouch Delica Lancets 02R MISC Check 1x a day 04/04/21  Yes Philemon Kingdom, MD  [facility] VERIO test strip USE AS INSTRUCTED TO TEST SUGAR 03/20/22  Yes Philemon Kingdom, MD  rosuvastatin (CRESTOR) 40 MG tablet Take 1 tablet (40 mg total) by mouth daily. 10/20/21  Yes Hochrein, Jeneen Rinks, MD  RYBELSUS 14 MG TABS TAKE 1 TABLET ('14MG'$ ) BY MOUTH ONCE DAILY 03/16/22  Yes Philemon Kingdom, MD  VASCEPA 1 g capsule TAKE 2 CAPSULES (2 G TOTAL) BY MOUTH 2 (TWO) TIMES DAILY. SCHEDULE CHECK UP 12/19/21  Yes Dettinger, Fransisca Kaufmann, MD  Vitamins/Minerals TABS Take by mouth.   Yes [provider]    Current Outpatient Medications  Medication Sig Dispense Refill   aspirin  81 MG tablet Take by mouth daily.      Cholecalciferol (VITAMIN D) 2000 units CAPS Take 1 capsule by mouth daily.     FARXIGA 10 MG TABS tablet TAKE 1 TABLET BY MOUTH EVERY DAY BEFORE BREAKFAST 90 tablet 1   glipiZIDE (GLUCOTROL) 5 MG tablet Take 0.5 tablets (2.5 mg total) by mouth daily before supper. 45 tablet 3   ibuprofen (ADVIL) 200 MG tablet Take 600 mg by mouth every 6 (six) hours as needed.     metFORMIN (GLUCOPHAGE-XR) 500 MG 24 hr tablet TAKE 2 TABLETS (1,000 MG TOTAL) BY MOUTH 2 (TWO) TIMES DAILY WITH A MEAL. 360 tablet 3   olmesartan (BENICAR) 20 MG tablet TAKE 1/2 TABLET BY MOUTH DAILY 45 tablet 3   OneTouch Delica Lancets 81X MISC Check 1x a day 100 each 3   ONETOUCH VERIO test strip USE AS INSTRUCTED TO TEST SUGAR  100 strip 3   rosuvastatin (CRESTOR) 40 MG tablet Take 1 tablet (40 mg total) by mouth daily. 90 tablet 3   RYBELSUS 14 MG TABS TAKE 1 TABLET ('14MG'$ ) BY MOUTH ONCE DAILY 30 tablet 0   VASCEPA 1 g capsule TAKE 2 CAPSULES (2 G TOTAL) BY MOUTH 2 (TWO) TIMES DAILY. SCHEDULE CHECK UP 120 capsule 3   Vitamins/Minerals TABS Take by mouth.     Current Facility-Administered Medications  Medication Dose Route Frequency Provider Last Rate Last Admin   0.9 %  sodium chloride infusion  500 mL Intravenous Once Daijah Scrivens, Lajuan Lines, MD        Allergies as of 03/27/2022 - Review Complete 03/27/2022  Allergen Reaction Noted   Codeine  09/10/2009   Erythromycin  09/10/2009   Latex  03/21/2015   Other  05/28/2015    Family History  Problem Relation Age of Onset   Breast cancer Mother 26   Hypertension Mother    Cancer Mother    CAD Father 13       Died age 36 after CABG   Stroke Father    Hypertension Father    AAA (abdominal aortic aneurysm) Father    Hyperlipidemia Father    Heart disease Father    Cancer Sister        ovarian   Diabetes Sister    Thyroid disease Sister    Colon polyps Neg Hx    Colon cancer Neg Hx    Esophageal cancer Neg Hx    Stomach cancer Neg Hx    Rectal cancer Neg Hx     Social History   Socioeconomic History   Marital status: Married    Spouse name: Not on file   Number of children: 3   Years of education: Not on file   Highest education level: Not on file  Occupational History    Employer: [employer]  Tobacco Use   Smoking status: Former    Types: Cigarettes    Quit date: 11/02/1974    Years since quitting: 47.4   Smokeless tobacco: Never  Vaping Use   Vaping Use: Never used  Substance and Sexual Activity   Alcohol use: Yes    Alcohol/week: 1.0 - 2.0 standard drink of alcohol    Types: 1 - 2 Standard drinks or equivalent per week    Comment: 2-3 PER WEEK   Drug use: Yes    Types: Marijuana   Sexual activity: Not on file  Other Topics  Concern   Not on file  Social History Narrative   Lives at home  with wife.     Social Determinants of Health   Financial Resource Strain: Not on file  Food Insecurity: Not on file  Transportation Needs: Not on file  Physical Activity: Not on file  Stress: Not on file  Social Connections: Not on file  Intimate Partner Violence: Not on file    Physical Exam: Vital signs in last 24 hours: '@BP'$  101/62   Pulse (!) 108   Temp 98 F (36.7 C)   Ht 6' (1.829 m)   Wt 194 lb (88 kg)   SpO2 95%   BMI 26.31 kg/m  GEN: NAD EYE: Sclerae anicteric ENT: MMM CV: Non-tachycardic Pulm: CTA b/l GI: Soft, NT/ND NEURO:  Alert & Oriented x 3   Zenovia Jarred, MD [facility]  03/27/2022 1:53 PM

## 2022-03-27 NOTE — Patient Instructions (Signed)
Await pathology results.  Handout on polyps and hemorrhoids given.  YOU HAD AN ENDOSCOPIC PROCEDURE TODAY AT [facility]:   Refer to the procedure report that was given to you for any specific questions about what was found during the examination.  If the procedure report does not answer your questions, please call your gastroenterologist to clarify.  If you requested that your care partner not be given the details of your procedure findings, then the procedure report has been included in a sealed envelope for you to review at your convenience later.  YOU SHOULD EXPECT: Some feelings of bloating in the abdomen. Passage of more gas than usual.  Walking can help get rid of the air that was put into your GI tract during the procedure and reduce the bloating. If you had a lower endoscopy (such as a colonoscopy or flexible sigmoidoscopy) you may notice spotting of blood in your stool or on the toilet paper. If you underwent a bowel prep for your procedure, you may not have a normal bowel movement for a few days.  Please Note:  You might notice some irritation and congestion in your nose or some drainage.  This is from the oxygen used during your procedure.  There is no need for concern and it should clear up in a day or so.  SYMPTOMS TO REPORT IMMEDIATELY:  Following lower endoscopy (colonoscopy or flexible sigmoidoscopy):  Excessive amounts of blood in the stool  Significant tenderness or worsening of abdominal pains  Swelling of the abdomen that is new, acute  Fever of 100F or higher   For urgent or emergent issues, a gastroenterologist can be reached at any hour by calling [phone]. Do not use MyChart messaging for urgent concerns.    DIET:  We do recommend a small meal at first, but then you may proceed to your regular diet.  Drink plenty of fluids but you should avoid alcoholic beverages for 24 hours.  ACTIVITY:  You should plan to take it easy for the rest of today  and you should NOT DRIVE or use heavy machinery until tomorrow (because of the sedation medicines used during the test).    FOLLOW UP: Our staff will call the number listed on your records the next business day following your procedure.  We will call around 7:15- 8:00 am to check on you and address any questions or concerns that you may have regarding the information given to you following your procedure. If we do not reach you, we will leave a message.  If you develop any symptoms (ie: fever, flu-like symptoms, shortness of breath, cough etc.) before then, please call [phone].  If you test positive for Covid 19 in the 2 weeks post procedure, please call and report this information to [place].    If any biopsies were taken you will be contacted by phone or by letter within the next 1-3 weeks.  Please call us at [phone] if you have not heard about the biopsies in 3 weeks.    SIGNATURES/CONFIDENTIALITY: You and/or your care partner have signed paperwork which will be entered into your electronic medical record.  These signatures attest to the fact that that the information above on your After Visit Summary has been reviewed and is understood.  Full responsibility of the confidentiality of this discharge information lies with you and/or your care-partner.

## 2022-03-27 NOTE — Progress Notes (Signed)
PT taken to PACU. Monitors in place. VSS. Report given to RN. 

## 2022-03-30 ENCOUNTER — Telehealth: Payer: Self-pay

## 2022-03-30 NOTE — Telephone Encounter (Signed)
  Follow up Call-     03/27/2022    1:37 PM  Call back number  Post procedure Call Back phone  # [phone]  Permission to leave phone message Yes     Patient questions:  Do you have a fever, pain , or abdominal swelling? No. Pain Score  0 *  Have you tolerated food without any problems? Yes.    Have you been able to return to your normal activities? Yes.    Do you have any questions about your discharge instructions: Diet   No. Medications  No. Follow up visit  No.  Do you have questions or concerns about your Care? No.  Actions: * If pain score is 4 or above: No action needed, pain <4.

## 2022-04-07 ENCOUNTER — Encounter: Payer: Self-pay | Admitting: Internal Medicine

## 2022-04-24 ENCOUNTER — Other Ambulatory Visit: Payer: Self-pay

## 2022-04-24 ENCOUNTER — Telehealth: Payer: Self-pay | Admitting: Internal Medicine

## 2022-04-24 DIAGNOSIS — E1165 Type 2 diabetes mellitus with hyperglycemia: Secondary | ICD-10-CM

## 2022-04-24 MED ORDER — RYBELSUS 14 MG PO TABS: ORAL_TABLET | ORAL | 0 refills | Status: DC

## 2022-04-24 NOTE — Telephone Encounter (Signed)
MEDICATION: Rybelsus 14 MG  PHARMACY:  [id number] [place], [place] - [address] HAS THE PATIENT CONTACTED THEIR PHARMACY?  YES  IS THIS A 90 DAY SUPPLY : YES  IS PATIENT OUT OF MEDICATION: YES  IF NOT; HOW MUCH IS LEFT:   LAST APPOINTMENT DATE: '@6'$ /23/2023  NEXT APPOINTMENT DATE:'@10'$ /02/2022  DO WE HAVE YOUR PERMISSION TO LEAVE A DETAILED MESSAGE?: yes  OTHER COMMENTS:    **Let patient know to contact pharmacy at the end of the day to make sure medication is ready. **  ** Please notify patient to allow 48-72 hours to process**  **Encourage patient to contact the pharmacy for refills or [facility]*

## 2022-04-24 NOTE — Telephone Encounter (Signed)
RX sent to preferred pharmacy 

## 2022-05-03 ENCOUNTER — Other Ambulatory Visit: Payer: Self-pay | Admitting: Family Medicine

## 2022-05-08 ENCOUNTER — Telehealth (INDEPENDENT_AMBULATORY_CARE_PROVIDER_SITE_OTHER): Payer: BC Managed Care – PPO | Admitting: Family Medicine

## 2022-05-08 ENCOUNTER — Encounter: Payer: Self-pay | Admitting: Family Medicine

## 2022-05-08 DIAGNOSIS — R202 Paresthesia of skin: Secondary | ICD-10-CM

## 2022-05-08 MED ORDER — PREDNISONE 20 MG PO TABS: 20.0000 mg | ORAL_TABLET | Freq: Every day | ORAL | 0 refills | Status: AC

## 2022-05-08 NOTE — Progress Notes (Signed)
Virtual Visit via video Note   Due to COVID-19 pandemic this visit was conducted virtually. This visit type was conducted due to national recommendations for restrictions regarding the COVID-19 Pandemic (e.g. social distancing, sheltering in place) in an effort to limit this patient's exposure and mitigate transmission in our community. All issues noted in this document were discussed and addressed.  A physical exam was not performed with this format.  I connected with  Austin Graham  on 05/08/22 at 1602 by video and verified that I am speaking with the correct person using two identifiers. Austin Graham is currently located at home and his wife is currently with him during the visit. The provider, Gwenlyn Perking, FNP is located in their office at time of visit.  I discussed the limitations, risks, security and privacy concerns of performing an evaluation and management service by video  and the availability of in person appointments. I also discussed with the patient that there may be a patient responsible charge related to this service. The patient expressed understanding and agreed to proceed.  CC: tingling  History and Present Illness:  Austin Graham reports a tingling sensation along the right side of his abdomen for 1 week. It starts to the right of his abdomen and extended to his back. It does not cross the midline. This are is very sensitive to the touch. There is no rash. It is not itchy. Hot showers provide relief. He reports that this has been unchanged. It does also report that he had some tingling on one of of his face a few weeks ago for a few days and also down his right side for a few days. He is a T2DM but denies neuropathy. 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 He is concerned about shingles. He has had the shingles vaccine.    ROS As per HPI.    Observations/Objective: Alert and oriented x 3. Able to speak in full sentences without difficulty. No respiratory distress  or cyanosis. No rash or skin changes present.   Assessment and Plan: Lesley was seen today for tingling.  Diagnoses and all orders for this visit:  Paresthesia Discussed shingles vs radiculopathy from DDD vs neuropathy. Symptoms have been present for 1 week without rash. Discussed would be out of window for antiviral treatment of shingles. Will given burst of prednisone for antiinflammatory effects. Discussed to follow up in the office if no improvement for further evaluation and labs.  -     predniSONE (DELTASONE) 20 MG tablet; Take 1 tablet (20 mg total) by mouth daily with breakfast for 5 days.   Follow Up Instructions: Return to office for new or worsening symptoms, or if symptoms persist.     I discussed the assessment and treatment plan with the patient. The patient was provided an opportunity to ask questions and all were answered. The patient agreed with the plan and demonstrated an understanding of the instructions.   The patient was advised to call back or seek an in-person evaluation if the symptoms worsen or if the condition fails to improve as anticipated.  The above assessment and management plan was discussed with the patient. The patient verbalized understanding of and has agreed to the management plan. Patient is aware to call the clinic if symptoms persist or worsen. Patient is aware when to return to the clinic for a follow-up visit. Patient educated on when it is appropriate to go to the emergency department.   Time call ended: 1614  I provided 12 minutes of face-to-face time during this encounter.    Gwenlyn Perking, FNP

## 2022-05-14 ENCOUNTER — Ambulatory Visit (INDEPENDENT_AMBULATORY_CARE_PROVIDER_SITE_OTHER): Payer: BC Managed Care – PPO | Admitting: Family Medicine

## 2022-05-14 ENCOUNTER — Ambulatory Visit (INDEPENDENT_AMBULATORY_CARE_PROVIDER_SITE_OTHER): Payer: BC Managed Care – PPO

## 2022-05-14 ENCOUNTER — Encounter: Payer: Self-pay | Admitting: Family Medicine

## 2022-05-14 VITALS — BP 118/70 | HR 91 | Temp 98.4°F | Ht 72.0 in | Wt 194.0 lb

## 2022-05-14 DIAGNOSIS — R1084 Generalized abdominal pain: Secondary | ICD-10-CM

## 2022-05-14 DIAGNOSIS — R110 Nausea: Secondary | ICD-10-CM

## 2022-05-14 DIAGNOSIS — M792 Neuralgia and neuritis, unspecified: Secondary | ICD-10-CM

## 2022-05-14 DIAGNOSIS — K5901 Slow transit constipation: Secondary | ICD-10-CM

## 2022-05-14 LAB — URINALYSIS
Bilirubin, UA: NEGATIVE
Ketones, UA: NEGATIVE
Leukocytes,UA: NEGATIVE
Nitrite, UA: NEGATIVE
Protein,UA: NEGATIVE
RBC, UA: NEGATIVE
Specific Gravity, UA: <1.005 — ABNORMAL LOW (ref 1.005–1.030)
Urobilinogen, Ur: 0.2 mg/dL (ref 0.2–1.0)
pH, UA: 5.5 (ref 5.0–7.5)

## 2022-05-14 MED ORDER — GABAPENTIN 100 MG PO CAPS: 100.0000 mg | ORAL_CAPSULE | Freq: Three times a day (TID) | ORAL | 0 refills | Status: DC | PRN

## 2022-05-14 NOTE — Patient Instructions (Signed)
Thank you for coming in to clinic today.  1. Your symptoms are consistent with Constipation, likely cause of your General Abdominal Pain / Cramping. 2. Start with Miralax, prescription was sent to pharmacy. First dose 68g (4 capfuls) in 32oz water over 1 to 2 hours for clean out. Next day start 17g or 1 capful daily, may adjust dose up or down by half a capful every few days. Recommend to take this medicine daily for next 1-2 weeks, you may need to use it longer if needed. - Goal is to have soft regular bowel movement 1-3x daily, if too runny or diarrhea, then reduce dose of the medicine to every other day.  Improve water intake, hydration will help Also recommend increased vegetables, fruits, fiber intake Can try daily Metamucil or Fiber supplement at pharmacy over the counter  Follow-up if symptoms are not improving with bowel movements, or if pain worsens, develop fevers, nausea, vomiting.  Please schedule a follow-up appointment with Michelle Izabella Marcantel, FNP, in 1 month to follow-up Constipation  If you have any other questions or concerns, please feel free to call the clinic to contact me. You may also schedule an earlier appointment if necessary.  However, if your symptoms get significantly worse, please go to the Emergency Department to seek immediate medical attention.  

## 2022-05-14 NOTE — Progress Notes (Signed)
Subjective:  Patient ID: Austin Graham, male    DOB: [birth year], 71 y.o.   MRN: [id number]  Patient Care Team: Dettinger, Fransisca Kaufmann, MD as PCP - General (Family Medicine) Minus Breeding, MD as PCP - Cardiology (Cardiology)   Chief Complaint:  Abdominal Pain (Left side front and back, sensitive to touch/X 2 weeks)   HPI: Austin Graham is a 71 y.o. male presenting on 05/14/2022 for Abdominal Pain (Left side front and back, sensitive to touch/X 2 weeks)   Patient presents today for evaluation of ongoing left-sided abdominal discomfort which radiates around to the left side of his back.  States pain is burning/tingling/numbness in nature.  He had a video visit last week for same and was placed on prednisone without relief of symptoms.  He denies any fever, chills, vomiting, diarrhea, melena, hematochezia, weakness, confusion.  He has had kidney stones in the past and reports this is not the same pain.  Denies any urinary symptoms.  States he has had some nausea since taking prednisone.  He denies rash or color change of skin.  States his skin is very sensitive to touch. He does have diabetes and reports blood sugars are ranging in the 120-140 range in the mornings.  No polyuria, polyphagia, or polydipsia.  He does report having irregular bowel movements with some mild constipation at times.    Relevant past medical, surgical, family, and social history reviewed and updated as indicated.  Allergies and medications reviewed and updated. Data reviewed: Chart in Epic.   Past Medical History:  Diagnosis Date   Arthritis    Asthma    as child   Complication of anesthesia    hard to waken   Diabetes ([place])    type II   Diverticulosis    Dysrhythmia    PAC's   Goiter    History of hiatal hernia    Hyperlipidemia    Hypertension    Kidney stones    Stress fracture    left knee, right hip   Thyroid disease     Past Surgical History:  Procedure Laterality Date   CYSTOSCOPY  WITH INSERTION OF UROLIFT     JOINT REPLACEMENT     KIDNEY STONES  2006   SKIN CANCER DESTRUCTION Left    deltoid- [facility]   SKIN CANCER EXCISION Right 09/16/2018   shoulder    TONSILLECTOMY     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   Procedure: RIGHT TOTAL HIP ARTHROPLASTY ANTERIOR APPROACH;  Surgeon: Gaynelle Arabian, MD;  Location: [facility];  Service: Orthopedics;  Laterality: Right;   WISDOM TOOTH EXTRACTION      Social History   Socioeconomic History   Marital status: Married    Spouse name: Not on file   Number of children: 3   Years of education: Not on file   Highest education level: Not on file  Occupational History    Employer: [employer]  Tobacco Use   Smoking status: Former    Types: Cigarettes    Quit date: 11/02/1974    Years since quitting: 47.5   Smokeless tobacco: Never  Vaping Use   Vaping Use: Never used  Substance and Sexual Activity   Alcohol use: Yes    Alcohol/week: 1.0 - 2.0 standard drink of alcohol    Types: 1 - 2 Standard drinks or equivalent per week    Comment: 2-3 PER WEEK   Drug use: Yes    Types: Marijuana   Sexual activity: Not on  file  Other Topics Concern   Not on file  Social History Narrative   Lives at home with wife.     Social Determinants of Health   Financial Resource Strain: Not on file  Food Insecurity: Not on file  Transportation Needs: Not on file  Physical Activity: Not on file  Stress: Not on file  Social Connections: Not on file  Intimate Partner Violence: Not on file    Outpatient Encounter Medications as of 05/14/2022  Medication Sig   aspirin 81 MG tablet Take by mouth daily.    Cholecalciferol (VITAMIN D) 2000 units CAPS Take 1 capsule by mouth daily.   FARXIGA 10 MG TABS tablet TAKE 1 TABLET BY MOUTH EVERY DAY BEFORE BREAKFAST   gabapentin (NEURONTIN) 100 MG capsule Take 1 capsule (100 mg total) by mouth 3 (three) times daily as needed (neuropathic pain).   glipiZIDE (GLUCOTROL) 5 MG tablet Take 0.5  tablets (2.5 mg total) by mouth daily before supper.   ibuprofen (ADVIL) 200 MG tablet Take 600 mg by mouth every 6 (six) hours as needed.   icosapent Ethyl (VASCEPA) 1 g capsule Take 2 capsules (2 g total) by mouth 2 (two) times daily.   metFORMIN (GLUCOPHAGE-XR) 500 MG 24 hr tablet TAKE 2 TABLETS (1,000 MG TOTAL) BY MOUTH 2 (TWO) TIMES DAILY WITH A MEAL.   olmesartan (BENICAR) 20 MG tablet TAKE 1/2 TABLET BY MOUTH DAILY   OneTouch Delica Lancets 09X MISC Check 1x a day   ONETOUCH VERIO test strip USE AS INSTRUCTED TO TEST SUGAR   rosuvastatin (CRESTOR) 40 MG tablet Take 1 tablet (40 mg total) by mouth daily.   Semaglutide (RYBELSUS) 14 MG TABS TAKE 1 TABLET (14MG) BY MOUTH ONCE DAILY   Vitamins/Minerals TABS Take by mouth.   No facility-administered encounter medications on file as of 05/14/2022.    Allergies  Allergen Reactions   Codeine     REACTION: agitation   Erythromycin     REACTION: diarrhea   Latex     rash   Other     Melon- itchy throat    Review of Systems  Constitutional:  Negative for activity change, appetite change, chills, diaphoresis, fatigue, fever and unexpected weight change.  HENT: Negative.    Respiratory:  Negative for cough, chest tightness and shortness of breath.   Cardiovascular:  Negative for chest pain, palpitations and leg swelling.  Gastrointestinal:  Positive for abdominal pain, constipation and nausea. Negative for abdominal distention, anal bleeding, blood in stool, diarrhea, rectal pain and vomiting.  Genitourinary:  Negative for decreased urine volume, difficulty urinating, dysuria, flank pain, frequency and hematuria.  Musculoskeletal:  Negative for arthralgias and myalgias.  Skin:  Negative for rash.  Neurological:  Positive for numbness. Negative for dizziness, tremors, seizures, syncope, facial asymmetry, speech difficulty, weakness, light-headedness and headaches.  Psychiatric/Behavioral:  Negative for confusion.   All other systems  reviewed and are negative.       Objective:  BP 118/70   Pulse 91   Temp 98.4 F (36.9 C)   Ht 6' (1.829 m)   Wt 194 lb (88 kg)   SpO2 96%   BMI 26.31 kg/m    Wt Readings from Last 3 Encounters:  05/14/22 194 lb (88 kg)  03/27/22 194 lb (88 kg)  03/06/22 194 lb 3.2 oz (88.1 kg)    Physical Exam Vitals and nursing note reviewed.  Constitutional:      General: He is not in acute distress.    Appearance:  Normal appearance. He is well-developed and well-groomed. He is not ill-appearing, toxic-appearing or diaphoretic.  HENT:     Head: Normocephalic and atraumatic.     Jaw: There is normal jaw occlusion.     Right Ear: Hearing normal.     Left Ear: Hearing normal.     Nose: Nose normal.     Mouth/Throat:     Lips: Pink.     Mouth: Mucous membranes are moist.     Pharynx: Oropharynx is clear. Uvula midline.  Eyes:     General: Lids are normal.     Extraocular Movements: Extraocular movements intact.     Conjunctiva/sclera: Conjunctivae normal.     Pupils: Pupils are equal, round, and reactive to light.  Neck:     Thyroid: No thyroid mass, thyromegaly or thyroid tenderness.     Vascular: No carotid bruit or JVD.     Trachea: Trachea and phonation normal.  Cardiovascular:     Rate and Rhythm: Regular rhythm.     Chest Wall: PMI is not displaced.     Pulses: Normal pulses.     Heart sounds: Normal heart sounds. No murmur heard.    No friction rub. No gallop.  Pulmonary:     Effort: Pulmonary effort is normal. No respiratory distress.     Breath sounds: Normal breath sounds. No wheezing.  Abdominal:     General: Abdomen is protuberant. Bowel sounds are normal. There is no distension or abdominal bruit.     Palpations: Abdomen is soft. There is no hepatomegaly or splenomegaly.     Tenderness: There is no abdominal tenderness. There is no right CVA tenderness, left CVA tenderness, guarding or rebound. Negative signs include Murphy's sign, Rovsing's sign, McBurney's sign,  psoas sign and obturator sign.     Hernia: A hernia is present. Hernia is present in the umbilical area.     Comments: Patient reports uncomfortable feeling to skin when palpating abdomen.  No rash.   Musculoskeletal:        General: Normal range of motion.     Cervical back: Normal range of motion and neck supple.     Right lower leg: No edema.     Left lower leg: No edema.  Lymphadenopathy:     Cervical: No cervical adenopathy.  Skin:    General: Skin is warm and dry.     Capillary Refill: Capillary refill takes less than 2 seconds.     Coloration: Skin is not cyanotic, jaundiced or pale.     Findings: No rash.  Neurological:     General: No focal deficit present.     Mental Status: He is alert and oriented to person, place, and time.     Cranial Nerves: Cranial nerves 2-12 are intact.     Sensory: Sensation is intact.     Motor: Motor function is intact.     Coordination: Coordination is intact.     Gait: Gait is intact.     Deep Tendon Reflexes: Reflexes are normal and symmetric.  Psychiatric:        Attention and Perception: Attention and perception normal.        Mood and Affect: Mood and affect normal.        Speech: Speech normal.        Behavior: Behavior normal. Behavior is cooperative.        Thought Content: Thought content normal.        Cognition and Memory: Cognition and memory normal.  Judgment: Judgment normal.     Results for orders placed or performed in visit on 12/18/21  CBC with Differential/Platelet  Result Value Ref Range   WBC 6.8 3.4 - 10.8 x10E3/uL   RBC 4.46 4.14 - 5.80 x10E6/uL   Hemoglobin 13.5 13.0 - 17.7 g/dL   Hematocrit 40.9 37.5 - 51.0 %   MCV 92 79 - 97 fL   MCH 30.3 26.6 - 33.0 pg   MCHC 33.0 31.5 - 35.7 g/dL   RDW 13.1 11.6 - 15.4 %   Platelets 250 150 - 450 x10E3/uL   Neutrophils 63 Not Estab. %   Lymphs 27 Not Estab. %   Monocytes 6 Not Estab. %   Eos 3 Not Estab. %   Basos 1 Not Estab. %   Neutrophils Absolute 4.3 1.4  - 7.0 x10E3/uL   Lymphocytes Absolute 1.8 0.7 - 3.1 x10E3/uL   Monocytes Absolute 0.4 0.1 - 0.9 x10E3/uL   EOS (ABSOLUTE) 0.2 0.0 - 0.4 x10E3/uL   Basophils Absolute 0.1 0.0 - 0.2 x10E3/uL   Immature Granulocytes 0 Not Estab. %   Immature Grans (Abs) 0.0 0.0 - 0.1 x10E3/uL  CMP14+EGFR  Result Value Ref Range   Glucose 275 (H) 70 - 99 mg/dL   BUN 18 8 - 27 mg/dL   Creatinine, Ser 1.33 (H) 0.76 - 1.27 mg/dL   eGFR 58 (L) >59 mL/min/1.73   BUN/Creatinine Ratio 14 10 - 24   Sodium 137 134 - 144 mmol/L   Potassium 4.5 3.5 - 5.2 mmol/L   Chloride 101 96 - 106 mmol/L   CO2 21 20 - 29 mmol/L   Calcium 9.8 8.6 - 10.2 mg/dL   Total Protein 6.2 6.0 - 8.5 g/dL   Albumin 4.7 3.8 - 4.8 g/dL   Globulin, Total 1.5 1.5 - 4.5 g/dL   Albumin/Globulin Ratio 3.1 (H) 1.2 - 2.2   Bilirubin Total 0.7 0.0 - 1.2 mg/dL   Alkaline Phosphatase 98 44 - 121 IU/L   AST 22 0 - 40 IU/L   ALT 27 0 - 44 IU/L  Lipid panel  Result Value Ref Range   Cholesterol, Total 88 (L) 100 - 199 mg/dL   Triglycerides 233 (H) 0 - 149 mg/dL   HDL 29 (L) >39 mg/dL   VLDL Cholesterol Cal 36 5 - 40 mg/dL   LDL Chol Calc (NIH) 23 0 - 99 mg/dL   Chol/HDL Ratio 3.0 0.0 - 5.0 ratio  Bayer DCA Hb A1c Waived  Result Value Ref Range   HB A1C (BAYER DCA - WAIVED) 7.0 (H) 4.8 - 5.6 %     X-Ray: KUB: Moderate stool burden. No acute findings. Preliminary x-ray reading by Monia Pouch, FNP-C, [facility].   Pertinent labs & imaging results that were available during my care of the patient were reviewed by me and considered in my medical decision making.  Assessment & Plan:  Dillon was seen today for abdominal pain.  Diagnoses and all orders for this visit:  Neuropathic pain Description and pattern of neuropathic pain concerning for shingles which did not manifest in a rash.  Prednisone was not beneficial.  Will check below labs for potential underlying causes.  Will trial gabapentin to see if beneficial.  Patient aware to report new,  worsening, or persistent symptoms. -     CMP14+EGFR -     CBC with Differential/Platelet -     Lipase -     Amylase -     Urinalysis -  DG Abd 1 View -     Vitamin B12 -     gabapentin (NEURONTIN) 100 MG capsule; Take 1 capsule (100 mg total) by mouth 3 (three) times daily as needed (neuropathic pain).  Generalized abdominal pain Nausea in adult Constipation KUB revealed significant stool burden. Miralax clean out discussed with pt. Increase water and fiber intake. Report new, worsening, or persistent symptoms.  -     CMP14+EGFR -     CBC with Differential/Platelet -     Lipase -     Amylase -     Urinalysis -     DG Abd 1 View    Continue all other maintenance medications.  Follow up plan: Return if symptoms worsen or fail to improve.   Continue healthy lifestyle choices, including diet (rich in fruits, vegetables, and lean proteins, and low in salt and simple carbohydrates) and exercise (at least 30 minutes of moderate physical activity daily).  Educational handout given for neuropathic pain, miralax clean out  The above assessment and management plan was discussed with the patient. The patient verbalized understanding of and has agreed to the management plan. Patient is aware to call the clinic if they develop any new symptoms or if symptoms persist or worsen. Patient is aware when to return to the clinic for a follow-up visit. Patient educated on when it is appropriate to go to the emergency department.   Monia Pouch, FNP-C [facility] [phone]

## 2022-05-15 ENCOUNTER — Other Ambulatory Visit: Payer: Self-pay | Admitting: Family Medicine

## 2022-05-15 DIAGNOSIS — R051 Acute cough: Secondary | ICD-10-CM

## 2022-05-15 DIAGNOSIS — R0989 Other specified symptoms and signs involving the circulatory and respiratory systems: Secondary | ICD-10-CM

## 2022-05-15 LAB — CMP14+EGFR
ALT: 32 IU/L (ref 0–44)
AST: 17 IU/L (ref 0–40)
Albumin/Globulin Ratio: 3.2 — ABNORMAL HIGH (ref 1.2–2.2)
Albumin: 5.4 g/dL — ABNORMAL HIGH (ref 3.9–4.9)
Alkaline Phosphatase: 93 IU/L (ref 44–121)
BUN/Creatinine Ratio: 15 (ref 10–24)
BUN: 20 mg/dL (ref 8–27)
Bilirubin Total: 0.8 mg/dL (ref 0.0–1.2)
CO2: 21 mmol/L (ref 20–29)
Calcium: 10.9 mg/dL — ABNORMAL HIGH (ref 8.6–10.2)
Chloride: 102 mmol/L (ref 96–106)
Creatinine, Ser: 1.32 mg/dL — ABNORMAL HIGH (ref 0.76–1.27)
Globulin, Total: 1.7 g/dL (ref 1.5–4.5)
Glucose: 297 mg/dL — ABNORMAL HIGH (ref 70–99)
Potassium: 5.3 mmol/L — ABNORMAL HIGH (ref 3.5–5.2)
Sodium: 139 mmol/L (ref 134–144)
Total Protein: 7.1 g/dL (ref 6.0–8.5)
eGFR: 58 mL/min/{1.73_m2} — ABNORMAL LOW (ref >59)

## 2022-05-15 LAB — VITAMIN B12: Vitamin B-12: 556 pg/mL (ref 232–1245)

## 2022-05-15 LAB — CBC WITH DIFFERENTIAL/PLATELET
Basophils Absolute: 0 10*3/uL (ref 0.0–0.2)
Basos: 0 %
EOS (ABSOLUTE): 0 10*3/uL (ref 0.0–0.4)
Eos: 0 %
Hematocrit: 42.4 % (ref 37.5–51.0)
Hemoglobin: 14.3 g/dL (ref 13.0–17.7)
Immature Grans (Abs): 0 10*3/uL (ref 0.0–0.1)
Immature Granulocytes: 1 %
Lymphocytes Absolute: 1.1 10*3/uL (ref 0.7–3.1)
Lymphs: 14 %
MCH: 31.4 pg (ref 26.6–33.0)
MCHC: 33.7 g/dL (ref 31.5–35.7)
MCV: 93 fL (ref 79–97)
Monocytes Absolute: 0.3 10*3/uL (ref 0.1–0.9)
Monocytes: 3 %
Neutrophils Absolute: 6.2 10*3/uL (ref 1.4–7.0)
Neutrophils: 82 %
Platelets: 243 10*3/uL (ref 150–450)
RBC: 4.56 x10E6/uL (ref 4.14–5.80)
RDW: 12.8 % (ref 11.6–15.4)
WBC: 7.7 10*3/uL (ref 3.4–10.8)

## 2022-05-15 LAB — LIPASE: Lipase: 55 U/L (ref 13–78)

## 2022-05-15 LAB — AMYLASE: Amylase: 51 U/L (ref 31–110)

## 2022-05-24 ENCOUNTER — Other Ambulatory Visit: Payer: Self-pay | Admitting: Internal Medicine

## 2022-05-24 DIAGNOSIS — E1159 Type 2 diabetes mellitus with other circulatory complications: Secondary | ICD-10-CM

## 2022-05-28 ENCOUNTER — Encounter: Payer: Self-pay | Admitting: Family Medicine

## 2022-05-28 ENCOUNTER — Ambulatory Visit (INDEPENDENT_AMBULATORY_CARE_PROVIDER_SITE_OTHER): Payer: BC Managed Care – PPO | Admitting: Family Medicine

## 2022-05-28 VITALS — BP 107/65 | HR 101 | Temp 98.4°F | Ht 72.0 in | Wt 195.0 lb

## 2022-05-28 DIAGNOSIS — R1012 Left upper quadrant pain: Secondary | ICD-10-CM | POA: Diagnosis not present

## 2022-05-28 DIAGNOSIS — M792 Neuralgia and neuritis, unspecified: Secondary | ICD-10-CM

## 2022-05-28 LAB — BMP8+EGFR
BUN/Creatinine Ratio: 14 (ref 10–24)
BUN: 18 mg/dL (ref 8–27)
CO2: 22 mmol/L (ref 20–29)
Calcium: 9.9 mg/dL (ref 8.6–10.2)
Chloride: 103 mmol/L (ref 96–106)
Creatinine, Ser: 1.25 mg/dL (ref 0.76–1.27)
Glucose: 145 mg/dL — ABNORMAL HIGH (ref 70–99)
Potassium: 5 mmol/L (ref 3.5–5.2)
Sodium: 140 mmol/L (ref 134–144)
eGFR: 62 mL/min/{1.73_m2} (ref >59)

## 2022-05-28 MED ORDER — VALACYCLOVIR HCL 1 G PO TABS: 1000.0000 mg | ORAL_TABLET | Freq: Two times a day (BID) | ORAL | 0 refills | Status: AC

## 2022-05-28 MED ORDER — GABAPENTIN 100 MG PO CAPS: 100.0000 mg | ORAL_CAPSULE | Freq: Three times a day (TID) | ORAL | 0 refills | Status: DC | PRN

## 2022-05-28 NOTE — Progress Notes (Signed)
Subjective:  Patient ID: Austin Graham, male    DOB: 09/11/1951, 71 y.o.   MRN: [id number]  Patient Care Team: Dettinger, Fransisca Kaufmann, MD as PCP - General (Family Medicine) Minus Breeding, MD as PCP - Cardiology (Cardiology)   Chief Complaint:  left side pain   HPI: Austin Graham is a 71 y.o. male presenting on 05/28/2022 for left side pain   Pt presents today with continued left abdominal pain that radiates to back. The pain is described as burning and sharp/shooting. This has been ongoing for several weeks. He had a video visit and was treated with steroids which were not beneficial. He was seen in office and comprehensive labs were completed along with imaging which revealed constipation. This was thought to be neuropathic pain and probable postherpetic neuralgia where rash did not manifest. Gabapentin was prescribed and was slightly beneficial. States once he finished the gabapentin the pain seemed to return and was worse. No other associated symptoms other than worry.     Relevant past medical, surgical, family, and social history reviewed and updated as indicated.  Allergies and medications reviewed and updated. Data reviewed: Chart in Epic.   Past Medical History:  Diagnosis Date   Arthritis    Asthma    as child   Complication of anesthesia    hard to waken   Diabetes ([place])    type II   Diverticulosis    Dysrhythmia    PAC's   Goiter    History of hiatal hernia    Hyperlipidemia    Hypertension    Kidney stones    Stress fracture    left knee, right hip   Thyroid disease     Past Surgical History:  Procedure Laterality Date   CYSTOSCOPY WITH INSERTION OF UROLIFT     JOINT REPLACEMENT     KIDNEY STONES  2006   SKIN CANCER DESTRUCTION Left    deltoid- [facility]   SKIN CANCER EXCISION Right 09/16/2018   shoulder    TONSILLECTOMY     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   Procedure: RIGHT TOTAL HIP ARTHROPLASTY ANTERIOR APPROACH;  Surgeon: Gaynelle Arabian,  MD;  Location: [facility];  Service: Orthopedics;  Laterality: Right;   WISDOM TOOTH EXTRACTION      Social History   Socioeconomic History   Marital status: Married    Spouse name: Not on file   Number of children: 3   Years of education: Not on file   Highest education level: Not on file  Occupational History    Employer: [employer]  Tobacco Use   Smoking status: Former    Types: Cigarettes    Quit date: 11/02/1974    Years since quitting: 47.6   Smokeless tobacco: Never  Vaping Use   Vaping Use: Never used  Substance and Sexual Activity   Alcohol use: Yes    Alcohol/week: 1.0 - 2.0 standard drink of alcohol    Types: 1 - 2 Standard drinks or equivalent per week    Comment: 2-3 PER WEEK   Drug use: Yes    Types: Marijuana   Sexual activity: Not on file  Other Topics Concern   Not on file  Social History Narrative   Lives at home with wife.     Social Determinants of Health   Financial Resource Strain: Not on file  Food Insecurity: Not on file  Transportation Needs: Not on file  Physical Activity: Not on file  Stress: Not on file  Social Connections: Not on file  Intimate Partner Violence: Not on file    Outpatient Encounter Medications as of 05/28/2022  Medication Sig   aspirin 81 MG tablet Take by mouth daily.    Cholecalciferol (VITAMIN D) 2000 units CAPS Take 1 capsule by mouth daily.   FARXIGA 10 MG TABS tablet TAKE 1 TABLET BY MOUTH EVERY DAY BEFORE BREAKFAST   gabapentin (NEURONTIN) 100 MG capsule Take 1 capsule (100 mg total) by mouth 3 (three) times daily as needed (neuropathic pain).   glipiZIDE (GLUCOTROL) 5 MG tablet Take 0.5 tablets (2.5 mg total) by mouth daily before supper.   ibuprofen (ADVIL) 200 MG tablet Take 600 mg by mouth every 6 (six) hours as needed.   icosapent Ethyl (VASCEPA) 1 g capsule Take 2 capsules (2 g total) by mouth 2 (two) times daily.   metFORMIN (GLUCOPHAGE-XR) 500 MG 24 hr tablet TAKE 2 TABLETS (1,000 MG TOTAL) BY MOUTH  2 (TWO) TIMES DAILY WITH A MEAL.   olmesartan (BENICAR) 20 MG tablet TAKE 1/2 TABLET BY MOUTH DAILY   OneTouch Delica Lancets 16S MISC Check 1x a day   ONETOUCH VERIO test strip USE AS INSTRUCTED TO TEST SUGAR   rosuvastatin (CRESTOR) 40 MG tablet Take 1 tablet (40 mg total) by mouth daily.   Semaglutide (RYBELSUS) 14 MG TABS TAKE 1 TABLET (14MG) BY MOUTH ONCE DAILY *NEED APPOINTMENT FOR FURTHER REFILLS*   valACYclovir (VALTREX) 1000 MG tablet Take 1 tablet (1,000 mg total) by mouth 2 (two) times daily for 10 days.   Vitamins/Minerals TABS Take by mouth.   [DISCONTINUED] gabapentin (NEURONTIN) 100 MG capsule Take 1 capsule (100 mg total) by mouth 3 (three) times daily as needed (neuropathic pain).   No facility-administered encounter medications on file as of 05/28/2022.    Allergies  Allergen Reactions   Codeine     REACTION: agitation   Erythromycin     REACTION: diarrhea   Latex     rash   Other     Melon- itchy throat    Review of Systems  Constitutional:  Negative for activity change, appetite change, chills, diaphoresis, fatigue, fever and unexpected weight change.  Respiratory:  Negative for cough and shortness of breath.   Cardiovascular:  Negative for chest pain, palpitations and leg swelling.  Gastrointestinal:  Positive for abdominal pain. Negative for abdominal distention, anal bleeding, blood in stool, constipation, diarrhea, nausea, rectal pain and vomiting.  Genitourinary:  Negative for decreased urine volume and difficulty urinating.  Psychiatric/Behavioral:  The patient is nervous/anxious.   All other systems reviewed and are negative.       Objective:  BP 107/65   Pulse (!) 101   Temp 98.4 F (36.9 C)   Ht 6' (1.829 m)   Wt 195 lb (88.5 kg)   SpO2 94%   BMI 26.45 kg/m    Wt Readings from Last 3 Encounters:  05/28/22 195 lb (88.5 kg)  05/14/22 194 lb (88 kg)  03/27/22 194 lb (88 kg)    Physical Exam Vitals and nursing note reviewed.   Constitutional:      General: He is not in acute distress.    Appearance: Normal appearance. He is not ill-appearing, toxic-appearing or diaphoretic.  HENT:     Head: Normocephalic and atraumatic.     Mouth/Throat:     Mouth: Mucous membranes are moist.  Eyes:     Pupils: Pupils are equal, round, and reactive to light.  Cardiovascular:     Rate and Rhythm:  Normal rate and regular rhythm.     Heart sounds: Normal heart sounds.  Pulmonary:     Effort: Pulmonary effort is normal.  Abdominal:     Palpations: Abdomen is soft.  Skin:    General: Skin is warm and dry.     Capillary Refill: Capillary refill takes less than 2 seconds.     Findings: No rash.  Neurological:     General: No focal deficit present.     Mental Status: He is alert.  Psychiatric:        Attention and Perception: Attention and perception normal.        Mood and Affect: Mood is anxious.        Speech: Speech normal.        Behavior: Behavior is cooperative.        Thought Content: Thought content normal.        Cognition and Memory: Cognition and memory normal.        Judgment: Judgment normal.     Results for orders placed or performed in visit on 05/14/22  CMP14+EGFR  Result Value Ref Range   Glucose 297 (H) 70 - 99 mg/dL   BUN 20 8 - 27 mg/dL   Creatinine, Ser 1.32 (H) 0.76 - 1.27 mg/dL   eGFR 58 (L) >59 mL/min/1.73   BUN/Creatinine Ratio 15 10 - 24   Sodium 139 134 - 144 mmol/L   Potassium 5.3 (H) 3.5 - 5.2 mmol/L   Chloride 102 96 - 106 mmol/L   CO2 21 20 - 29 mmol/L   Calcium 10.9 (H) 8.6 - 10.2 mg/dL   Total Protein 7.1 6.0 - 8.5 g/dL   Albumin 5.4 (H) 3.9 - 4.9 g/dL   Globulin, Total 1.7 1.5 - 4.5 g/dL   Albumin/Globulin Ratio 3.2 (H) 1.2 - 2.2   Bilirubin Total 0.8 0.0 - 1.2 mg/dL   Alkaline Phosphatase 93 44 - 121 IU/L   AST 17 0 - 40 IU/L   ALT 32 0 - 44 IU/L  CBC with Differential/Platelet  Result Value Ref Range   WBC 7.7 3.4 - 10.8 x10E3/uL   RBC 4.56 4.14 - 5.80 x10E6/uL    Hemoglobin 14.3 13.0 - 17.7 g/dL   Hematocrit 42.4 37.5 - 51.0 %   MCV 93 79 - 97 fL   MCH 31.4 26.6 - 33.0 pg   MCHC 33.7 31.5 - 35.7 g/dL   RDW 12.8 11.6 - 15.4 %   Platelets 243 150 - 450 x10E3/uL   Neutrophils 82 Not Estab. %   Lymphs 14 Not Estab. %   Monocytes 3 Not Estab. %   Eos 0 Not Estab. %   Basos 0 Not Estab. %   Neutrophils Absolute 6.2 1.4 - 7.0 x10E3/uL   Lymphocytes Absolute 1.1 0.7 - 3.1 x10E3/uL   Monocytes Absolute 0.3 0.1 - 0.9 x10E3/uL   EOS (ABSOLUTE) 0.0 0.0 - 0.4 x10E3/uL   Basophils Absolute 0.0 0.0 - 0.2 x10E3/uL   Immature Granulocytes 1 Not Estab. %   Immature Grans (Abs) 0.0 0.0 - 0.1 x10E3/uL  Lipase  Result Value Ref Range   Lipase 55 13 - 78 U/L  Amylase  Result Value Ref Range   Amylase 51 31 - 110 U/L  Urinalysis  Result Value Ref Range   Specific Gravity, UA <1.005 (L) 1.005 - 1.030   pH, UA 5.5 5.0 - 7.5   Color, UA Yellow Yellow   Appearance Ur Clear Clear   Leukocytes,UA Negative Negative  Protein,UA Negative Negative/Trace   Glucose, UA 3+ (A) Negative   Ketones, UA Negative Negative   RBC, UA Negative Negative   Bilirubin, UA Negative Negative   Urobilinogen, Ur 0.2 0.2 - 1.0 mg/dL   Nitrite, UA Negative Negative  Vitamin B12  Result Value Ref Range   Vitamin B-12 556 232 - 1,245 pg/mL       Pertinent labs & imaging results that were available during my care of the patient were reviewed by me and considered in my medical decision making.  Assessment & Plan:  Austin Graham was seen today for left side pain.  Diagnoses and all orders for this visit:  Left upper quadrant abdominal pain Ongoing symptoms without clear etiology. Will repeat BMP today and obtain CT abdomen due to ongoing and worsening symptoms. Further treatment if warranted.  -     CT Abdomen Pelvis Wo Contrast; Future -     BMP8+EGFR  Neuropathic pain Still feel this is likely postherpetic neuralgia. Will dose with antivirals and renew gabapentin as it was  beneficial. Report new, worsening, or persistent symptoms.  -     valACYclovir (VALTREX) 1000 MG tablet; Take 1 tablet (1,000 mg total) by mouth 2 (two) times daily for 10 days. -     gabapentin (NEURONTIN) 100 MG capsule; Take 1 capsule (100 mg total) by mouth 3 (three) times daily as needed (neuropathic pain).     Continue all other maintenance medications.  Follow up plan: Return if symptoms worsen or fail to improve.   Continue healthy lifestyle choices, including diet (rich in fruits, vegetables, and lean proteins, and low in salt and simple carbohydrates) and exercise (at least 30 minutes of moderate physical activity daily).  Educational handout given for postherpetic neuralgia  The above assessment and management plan was discussed with the patient. The patient verbalized understanding of and has agreed to the management plan. Patient is aware to call the clinic if they develop any new symptoms or if symptoms persist or worsen. Patient is aware when to return to the clinic for a follow-up visit. Patient educated on when it is appropriate to go to the emergency department.   Monia Pouch, FNP-C [facility] [phone]

## 2022-05-29 ENCOUNTER — Telehealth: Payer: Self-pay | Admitting: Family Medicine

## 2022-05-29 NOTE — Telephone Encounter (Signed)
Pt had visit with Monia Pouch yesterday who ordered for him to have scan done at [facility] and was told to call the office if he had not heard from anyone by this afternoon.   Pt still hasnt heard anything from anyone.

## 2022-06-09 NOTE — Telephone Encounter (Signed)
Lmtcb.

## 2022-06-09 NOTE — Telephone Encounter (Signed)
Patient states that he is still symptomatic and would like Sharyn Lull to try a peer to peer appeal to get approval

## 2022-06-10 ENCOUNTER — Other Ambulatory Visit: Payer: Self-pay | Admitting: Family Medicine

## 2022-06-10 DIAGNOSIS — R110 Nausea: Secondary | ICD-10-CM

## 2022-06-10 DIAGNOSIS — R1012 Left upper quadrant pain: Secondary | ICD-10-CM

## 2022-06-21 ENCOUNTER — Other Ambulatory Visit: Payer: Self-pay | Admitting: Internal Medicine

## 2022-06-21 DIAGNOSIS — E1165 Type 2 diabetes mellitus with hyperglycemia: Secondary | ICD-10-CM

## 2022-06-22 ENCOUNTER — Ambulatory Visit: Payer: BC Managed Care – PPO | Admitting: Family Medicine

## 2022-06-24 ENCOUNTER — Ambulatory Visit (INDEPENDENT_AMBULATORY_CARE_PROVIDER_SITE_OTHER): Payer: BC Managed Care – PPO | Admitting: Family Medicine

## 2022-06-24 ENCOUNTER — Encounter: Payer: Self-pay | Admitting: Family Medicine

## 2022-06-24 VITALS — BP 125/79 | HR 84 | Temp 98.0°F | Ht 72.0 in | Wt 195.0 lb

## 2022-06-24 DIAGNOSIS — N401 Enlarged prostate with lower urinary tract symptoms: Secondary | ICD-10-CM

## 2022-06-24 DIAGNOSIS — I10 Essential (primary) hypertension: Secondary | ICD-10-CM

## 2022-06-24 DIAGNOSIS — Z23 Encounter for immunization: Secondary | ICD-10-CM

## 2022-06-24 DIAGNOSIS — E1169 Type 2 diabetes mellitus with other specified complication: Secondary | ICD-10-CM | POA: Diagnosis not present

## 2022-06-24 DIAGNOSIS — E782 Mixed hyperlipidemia: Secondary | ICD-10-CM

## 2022-06-24 DIAGNOSIS — R351 Nocturia: Secondary | ICD-10-CM

## 2022-06-24 LAB — LIPID PANEL
Chol/HDL Ratio: 3.2 ratio (ref 0.0–5.0)
Cholesterol, Total: 102 mg/dL (ref 100–199)
HDL: 32 mg/dL — ABNORMAL LOW (ref >39)
LDL Chol Calc (NIH): 48 mg/dL (ref 0–99)
Triglycerides: 119 mg/dL (ref 0–149)
VLDL Cholesterol Cal: 22 mg/dL (ref 5–40)

## 2022-06-24 LAB — BAYER DCA HB A1C WAIVED: HB A1C (BAYER DCA - WAIVED): 7.3 % — ABNORMAL HIGH (ref 4.8–5.6)

## 2022-06-24 NOTE — Progress Notes (Signed)
BP 125/79   Pulse 84   Temp 98 F (36.7 C)   Ht 6' (1.829 m)   Wt 195 lb (88.5 kg)   SpO2 97%   BMI 26.45 kg/m    Subjective:   Patient ID: Austin Graham, male    DOB: 10-18-1950, 71 y.o.   MRN: [id number]  HPI: Austin Graham is a 71 y.o. male presenting on 06/24/2022 for Medical Management of Chronic Issues, Hyperlipidemia, Hypertension, Diabetes, and Abdominal Pain (Left sided, radiates to back)   HPI Type 2 diabetes mellitus Patient comes in today for recheck of his diabetes. Patient has been currently taking Rybelsus and Farxiga and glipizide and metformin. Patient is currently on an ACE inhibitor/ARB. Patient has not seen an ophthalmologist this year. Patient denies any issues with their feet. The symptom started onset as an adult hypertension and hyperlipidemia ARE RELATED TO DM   Hypertension Patient is currently on olmesartan, and their blood pressure today is 125/79. Patient denies any lightheadedness or dizziness. Patient denies headaches, blurred vision, chest pains, shortness of breath, or weakness. Denies any side effects from medication and is content with current medication.   Hyperlipidemia Patient is coming in for recheck of his hyperlipidemia. The patient is currently taking Crestor and Vascepa. They deny any issues with myalgias or history of liver damage from it. They deny any focal numbness or weakness or chest pain.   BPH Patient is coming in for recheck on BPH Symptoms: Nocturia Medication: None but wants to try the tamsulosin and has some at home Last PSA: Over 2 years ago  Relevant past medical, surgical, family and social history reviewed and updated as indicated. Interim medical history since our last visit reviewed. Allergies and medications reviewed and updated.  Review of Systems  Constitutional:  Negative for chills and fever.  Eyes:  Negative for visual disturbance.  Respiratory:  Negative for shortness of breath and wheezing.    Cardiovascular:  Negative for chest pain and leg swelling.  Genitourinary:  Positive for frequency. Negative for difficulty urinating.  Musculoskeletal:  Negative for back pain and gait problem.  Skin:  Negative for rash.  All other systems reviewed and are negative.   Per HPI unless specifically indicated above   Allergies as of 06/24/2022       Reactions   Codeine    REACTION: agitation   Erythromycin    REACTION: diarrhea   Latex    rash   Other    Melon- itchy throat        Medication List        Accurate as of June 24, 2022 11:03 AM. If you have any questions, ask your nurse or doctor.          STOP taking these medications    gabapentin 100 MG capsule Commonly known as: Neurontin Stopped by: Fransisca Kaufmann Darcee Dekker, MD       TAKE these medications    aspirin 81 MG tablet Take by mouth daily.   Farxiga 10 MG Tabs tablet Generic drug: dapagliflozin propanediol TAKE 1 TABLET BY MOUTH EVERY DAY BEFORE BREAKFAST   glipiZIDE 5 MG tablet Commonly known as: GLUCOTROL Take 0.5 tablets (2.5 mg total) by mouth daily before supper.   ibuprofen 200 MG tablet Commonly known as: ADVIL Take 600 mg by mouth every 6 (six) hours as needed.   icosapent Ethyl 1 g capsule Commonly known as: Vascepa Take 2 capsules (2 g total) by mouth 2 (two) times daily.   metFORMIN 500  MG 24 hr tablet Commonly known as: GLUCOPHAGE-XR TAKE 2 TABLETS (1,000 MG TOTAL) BY MOUTH 2 (TWO) TIMES DAILY WITH A MEAL.   olmesartan 20 MG tablet Commonly known as: BENICAR TAKE 1/2 TABLET BY MOUTH DAILY   OneTouch Delica Lancets 61Y Misc Check 1x a day   OneTouch Verio test strip Generic drug: glucose blood USE AS INSTRUCTED TO TEST SUGAR   rosuvastatin 40 MG tablet Commonly known as: CRESTOR Take 1 tablet (40 mg total) by mouth daily.   Rybelsus 14 MG Tabs Generic drug: Semaglutide TAKE 1 TABLET ('14MG'$ ) BY MOUTH ONCE DAILY *NEED APPOINTMENT FOR FURTHER REFILLS*   Vitamin D  50 MCG (2000 UT) Caps Take 1 capsule by mouth daily.   Vitamins/Minerals Tabs Take by mouth.         Objective:   BP 125/79   Pulse 84   Temp 98 F (36.7 C)   Ht 6' (1.829 m)   Wt 195 lb (88.5 kg)   SpO2 97%   BMI 26.45 kg/m   Wt Readings from Last 3 Encounters:  06/24/22 195 lb (88.5 kg)  05/28/22 195 lb (88.5 kg)  05/14/22 194 lb (88 kg)    Physical Exam Vitals and nursing note reviewed.  Constitutional:      General: He is not in acute distress.    Appearance: He is well-developed. He is not diaphoretic.  Eyes:     General: No scleral icterus.    Conjunctiva/sclera: Conjunctivae normal.  Neck:     Thyroid: No thyromegaly.  Cardiovascular:     Rate and Rhythm: Normal rate and regular rhythm.     Heart sounds: Normal heart sounds. No murmur heard. Pulmonary:     Effort: Pulmonary effort is normal. No respiratory distress.     Breath sounds: Normal breath sounds. No wheezing.  Musculoskeletal:        General: No swelling. Normal range of motion.     Cervical back: Neck supple.  Lymphadenopathy:     Cervical: No cervical adenopathy.  Skin:    General: Skin is warm and dry.     Findings: No rash.  Neurological:     Mental Status: He is alert and oriented to person, place, and time.     Coordination: Coordination normal.  Psychiatric:        Behavior: Behavior normal.       Assessment & Plan:   Problem List Items Addressed This Visit       Cardiovascular and Mediastinum   Hypertension     Endocrine   Diabetes mellitus ([place]) - Primary   Relevant Orders   Lipid panel   Bayer DCA Hb A1c Waived     Genitourinary   BPH (benign prostatic hyperplasia)   Relevant Orders   PSA, total and free     Other   Hyperlipidemia    A1c slightly up at 7.3, sees endocrinology.  Patient sees urology but has not had a PSA checked in some time, will check that today. Follow up plan: Return in about 6 months (around 12/23/2022), or if symptoms worsen or fail to  improve, for Diabetes hypertension cholesterol..  Counseling provided for all of the vaccine components Orders Placed This Encounter  Procedures   Lipid panel   Bayer DCA Hb A1c Waived   PSA, total and free    Caryl Pina, MD [facility] 06/24/2022, 11:03 AM

## 2022-06-24 NOTE — Addendum Note (Signed)
Addended by: Alphonzo Dublin on: 06/24/2022 05:08 PM   Modules accepted: Orders

## 2022-06-26 LAB — PSA: Prostate Specific Ag, Serum: 0.4 ng/mL (ref 0.0–4.0)

## 2022-06-26 LAB — SPECIMEN STATUS REPORT

## 2022-07-02 ENCOUNTER — Other Ambulatory Visit: Payer: Self-pay | Admitting: Family Medicine

## 2022-07-03 ENCOUNTER — Ambulatory Visit: Payer: BC Managed Care – PPO | Admitting: Internal Medicine

## 2022-07-03 ENCOUNTER — Encounter: Payer: Self-pay | Admitting: Internal Medicine

## 2022-07-03 VITALS — BP 124/78 | HR 78 | Ht 72.0 in | Wt 195.4 lb

## 2022-07-03 DIAGNOSIS — E782 Mixed hyperlipidemia: Secondary | ICD-10-CM

## 2022-07-03 DIAGNOSIS — E042 Nontoxic multinodular goiter: Secondary | ICD-10-CM

## 2022-07-03 DIAGNOSIS — E1165 Type 2 diabetes mellitus with hyperglycemia: Secondary | ICD-10-CM | POA: Diagnosis not present

## 2022-07-03 DIAGNOSIS — E1159 Type 2 diabetes mellitus with other circulatory complications: Secondary | ICD-10-CM | POA: Diagnosis not present

## 2022-07-03 MED ORDER — FREESTYLE LIBRE 3 SENSOR MISC: 1.0000 | 3 refills | Status: AC

## 2022-07-03 NOTE — Progress Notes (Signed)
Patient ID: Austin Graham, male   DOB: June 13, 1951, 71 y.o.   MRN: [id number]   HPI: Austin Graham is a 71 y.o.-year-old male, initially referred by his cardiologist, Dr. Percival Spanish, returning for follow-up for DM2, dx in ~2013, non-insulin-dependent, uncontrolled, with complications (CAD, CKD) and also thyroid nodules.Last visit 9 months ago.  Interim history: No blurry vision, nausea, chest pain. He relaxed his diet recently as he is taking care of his grandson and eating more snacks with him.  Also, his meals are not very well scheduled.  He recently started to walk in the park in the morning after he takes his grandson to daycare. We have been forgetting to take glipizide before dinner approximately every other day. He had an episode of L abd. Radiculopathy over the summer-presumed to be shingles but he had no rash >> was on Valtrex and gabapentin.  The pain resolved over 2 to 3 months.  Reviewed history: I saw the patient first in 09/2019.  At that time, we stopped Janumet and started metformin ER, we increased Rybelsus dose and we added [place].  He developed diarrhea, indigestion, loss of appetite, nausea, after the visit, and he stopped all of his medicines.  The symptoms resolved and I advised him to start adding back the medicines one by one.  However, he was lost for follow-up afterwards and returned to see me in 12/2020.  Reviewed latest HbA1c levels: Lab Results  Component Value Date   HGBA1C 7.3 (H) 06/24/2022   HGBA1C 7.0 (H) 12/18/2021   HGBA1C 7.5 (A) 10/03/2021   HGBA1C 6.7 (H) 04/03/2021   HGBA1C 7.0 (A) 01/14/2021   HGBA1C 8.7 (H) 07/19/2020   HGBA1C 7.5 (H) 01/18/2020   HGBA1C 8.9 (A) 10/27/2019   HGBA1C 7.5 (H) 07/07/2019   HGBA1C 7.2 (H) 03/27/2019   He is on: - Metformin ER 1000 mg 2x a day with meals (L and D) - Glipizide 2.5 mg before dinner - forgets it - qod mostly - Rybelsus 7 >> 14 mg daily- tolerated well - Farxiga 10 mg before b'fast He was previously  on Trulicity >> nausea, lightheadedness.  Pt checks his sugars 1-2x a day and they are: - am: 120, 135-172, 207 >> 98-175, 189 >> 115-184 >> 89, 125 at 5 am,  114-199 (pizza) - 2h after b'fast: 128-292 >> 113-139, 167 >> 151-196 >> 164, 178 - before lunch: n/c >> 139, 180, 248 >> 128 >> 101, 144 >> n/c - 2h after lunch: 122-268, 308 >> 90-127 >> 168-196 >> 172 - before dinner: n/c >> 112 - 2h after dinner: n/c >> 118, 134 >> 106 >> 114-163 >> n/c - bedtime: 180-220 >> n/c >> 164 - nighttime: n/c >> 114, 153 Lowest sugar was 138 >> 120 >> 98 >> 101 >> 89; ? hypoglycemia awareness..  Highest sugar was 303 >> 308 >> 189 >> 196 >> 317.  Glucometer: One Touch ultra  Pt's meals are: - Breakfast: oatmeal, cereals, granola - Lunch: sandwich on rye or whole wheat; bacon and eggs - Dinner: vegetable, potatoes, meat or pasta - Snacks: 2-3 -stopped sweets  - + mild CKD, last BUN/creatinine:  Lab Results  Component Value Date   BUN 18 05/28/2022   BUN 20 05/14/2022   CREATININE 1.25 05/28/2022   CREATININE 1.32 (H) 05/14/2022  On Benicar 20.  -+ HL; last set of lipids: Lab Results  Component Value Date   CHOL 102 06/24/2022   HDL 32 (L) 06/24/2022   LDLCALC 48 06/24/2022  TRIG 119 06/24/2022   CHOLHDL 3.2 06/24/2022  On Crestor 40, Vascepa.  - last eye exam was on 09/19/2021: No DR.   - no numbness and tingling in his feet. He has a foor sput - plantar fasciitis.  Last foot exam 06/20/2022.  Pt has FH of DM in F, S, GM.  He also has a history of HTN, GERD, multinodular goiter.  Thyroid ultrasound (07/05/2012) showed very small nodules, stable: Right thyroid lobe:  5.0 x 1.7 x 2.3 cm. Left thyroid lobe:  5.6 x 1.9 x 2.4 cm. Isthmus:   Focal nodules:  Multiple bilateral small thyroid nodules.  The largest on the right is 10 mm in the lower pole compared 13 mm previously.  The largest on the left is 10 mm in the mid pole compared to 9 mm on the previous study.  No significant  change.  No dominant nodule.   Lymphadenopathy:   None.   IMPRESSION: Numerous bilateral small thyroid nodules, none significantly changed.  He saw Dr. Dalbert Batman in the past and had FNAs:   Date Taken: 05/10/2008   Satisfactory for evaluation.    INTERPRETATION(S):   THYROID, LEFT, FINE NEEDLE ASPIRATION (THIN PREP, SMEARS BLOCK):   - FINDINGS CONSISTENT WITH NON-NEOPLASTIC GOITER.   - SEE COMMENT.   Date Taken: 05/10/2008  Satisfactory for evaluation.    INTERPRETATION(S):   THYROID, RIGHT, FINE NEEDLE ASPIRATION (THIN PREP, SMEARS BLOCK):   - FINDINGS CONSISTENT WITH NON-NEOPLASTIC GOITER.   - SEE COMMENT.   Thyroid ultrasound (11/03/2019): Parenchymal Echotexture: Moderately heterogenous Isthmus: 0.4 cm thickness, stable Right lobe: 5.6 x 1.7 x 2.1 cm, previously 5.3 x 2.1 x 2.1 Left lobe: 4.9 x 2 x 1.9 cm, previously 5.4 x 2.2 x 1.7 _____________________________________________________________   Nodule # 1: Prior biopsy: No Location: Right; Inferior Maximum size: 0.98 cm; Other 2 dimensions: 0.8 x 0.8 cm, previously, 0.7 x 0.6 x 0.6 cm  Composition: mixed cystic and solid (1)  Echogenicity: hypoechoic (2) Significant change in size (>/= 20% in two dimensions and minimal increase of 2 mm): Yes *Given size (<1.4 cm) and appearance, this nodule does NOT meet TI-RADS criteria for biopsy or dedicated follow-up. ________________________________________________________   0.6 cm mixed solid/cystic inferior left nodule, previously 0.7; This nodule does NOT meet TI-RADS criteria for biopsy or dedicated follow-up.   Additional scattered hypoechoic lesions throughout both lobes all less than 0.5 cm diameter.   IMPRESSION: 1. Stable mild thyromegaly with scattered subcentimeter nodules. None meet criteria for biopsy or dedicated imaging follow-up.  Reviewed latest TSH: Lab Results  Component Value Date   TSH 1.95 10/27/2019   TSH 2.710 02/10/2018   TSH 3.820 04/01/2016    Pt denies: - feeling nodules in neck - hoarseness - dysphagia - choking  No FH of thyroid cancer. No h/o radiation tx to head or neck. +FH of thyroid ds in sister, GM  He also has BPH with nocturia.  ROS: + See HPI, + nocturia, + joint aches  I reviewed pt's medications, allergies, PMH, social hx, family hx, and changes were documented in the history of present illness. Otherwise, unchanged from my initial visit note.  Past Medical History:  Diagnosis Date   Arthritis    Asthma    as child   Complication of anesthesia    hard to waken   Diabetes ([place])    type II   Diverticulosis    Dysrhythmia    PAC's   Goiter    History of hiatal hernia  Hyperlipidemia    Hypertension    Kidney stones    Stress fracture    left knee, right hip   Thyroid disease    Past Surgical History:  Procedure Laterality Date   CYSTOSCOPY WITH INSERTION OF UROLIFT     JOINT REPLACEMENT     KIDNEY STONES  2006   SKIN CANCER DESTRUCTION Left    deltoid- [facility]   SKIN CANCER EXCISION Right 09/16/2018   shoulder    TONSILLECTOMY     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   Procedure: RIGHT TOTAL HIP ARTHROPLASTY ANTERIOR APPROACH;  Surgeon: Gaynelle Arabian, MD;  Location: [facility];  Service: Orthopedics;  Laterality: Right;   WISDOM TOOTH EXTRACTION     Social History   Socioeconomic History   Marital status: Married    Spouse name: Not on file   Number of children: 3   Years of education: Not on file   Highest education level: Not on file  Occupational History    Employer:  Funeral director's assistant  Tobacco Use   Smoking status: Former Smoker    Quit date: 11/02/1974    Years since quitting: 45.0   Smokeless tobacco: Never Used  Substance and Sexual Activity   Alcohol use: Yes    Alcohol/week: 1.0 - 2.0 standard drinks    Types: 1 - 2 Standard drinks or equivalent per week    Comment: 2-3 PER WEEK   Drug use: no    Types:    Sexual activity: Not on file  Other Topics  Concern   Not on file  Social History Narrative   Lives at home with wife.     Social Determinants of Health   Financial Resource Strain:    Difficulty of Paying Living Expenses: Not on file  Food Insecurity:    Worried About Charity fundraiser in the Last Year: Not on file   [employer] of Food in the Last Year: Not on file  Transportation Needs:    Lack of Transportation (Medical): Not on file   Lack of Transportation (Non-Medical): Not on file  Physical Activity:    Days of Exercise per Week: Not on file   Minutes of Exercise per Session: Not on file  Stress:    Feeling of Stress : Not on file  Social Connections:    Frequency of Communication with Friends and Family: Not on file   Frequency of Social Gatherings with Friends and Family: Not on file   Attends Religious Services: Not on file   Active Member of Clubs or Organizations: Not on file   Attends Archivist Meetings: Not on file   Marital Status: Not on file  Intimate Partner Violence:    Fear of Current or Ex-Partner: Not on file   Emotionally Abused: Not on file   Physically Abused: Not on file   Sexually Abused: Not on file   Current Outpatient Medications on File Prior to Visit  Medication Sig Dispense Refill   aspirin 81 MG tablet Take by mouth daily.      Cholecalciferol (VITAMIN D) 2000 units CAPS Take 1 capsule by mouth daily.     FARXIGA 10 MG TABS tablet TAKE 1 TABLET BY MOUTH EVERY DAY BEFORE BREAKFAST 90 tablet 1   glipiZIDE (GLUCOTROL) 5 MG tablet Take 0.5 tablets (2.5 mg total) by mouth daily before supper. 45 tablet 3   ibuprofen (ADVIL) 200 MG tablet Take 600 mg by mouth every 6 (six) hours as needed.  metFORMIN (GLUCOPHAGE-XR) 500 MG 24 hr tablet TAKE 2 TABLETS (1,000 MG TOTAL) BY MOUTH 2 (TWO) TIMES DAILY WITH A MEAL. 360 tablet 3   olmesartan (BENICAR) 20 MG tablet TAKE 1/2 TABLET BY MOUTH DAILY 45 tablet 3   OneTouch Delica Lancets 16X MISC Check 1x a day 100 each 3   ONETOUCH VERIO  test strip USE AS INSTRUCTED TO TEST SUGAR 100 strip 3   rosuvastatin (CRESTOR) 40 MG tablet Take 1 tablet (40 mg total) by mouth daily. 90 tablet 3   RYBELSUS 14 MG TABS TAKE 1 TABLET ('14MG'$ ) BY MOUTH ONCE DAILY *NEED APPOINTMENT FOR FURTHER REFILLS* 30 tablet 0   VASCEPA 1 g capsule TAKE 2 CAPSULES BY MOUTH 2 TIMES DAILY. 360 capsule 1   Vitamins/Minerals TABS Take by mouth.     No current facility-administered medications on file prior to visit.   Allergies  Allergen Reactions   Codeine     REACTION: agitation   Erythromycin     REACTION: diarrhea   Latex     rash   Other     Melon- itchy throat   Family History  Problem Relation Age of Onset   Breast cancer Mother 42   Hypertension Mother    Cancer Mother    CAD Father 30       Died age 71 after CABG   Stroke Father    Hypertension Father    AAA (abdominal aortic aneurysm) Father    Hyperlipidemia Father    Heart disease Father    Cancer Sister        ovarian   Diabetes Sister    Thyroid disease Sister    Colon polyps Neg Hx    Colon cancer Neg Hx    Esophageal cancer Neg Hx    Stomach cancer Neg Hx    Rectal cancer Neg Hx    PE: BP 124/78 (BP Location: Right Arm, Patient Position: Sitting, Cuff Size: Normal)   Pulse 78   Ht 6' (1.829 m)   Wt 195 lb 6.4 oz (88.6 kg)   SpO2 99%   BMI 26.50 kg/m  Wt Readings from Last 3 Encounters:  07/03/22 195 lb 6.4 oz (88.6 kg)  06/24/22 195 lb (88.5 kg)  05/28/22 195 lb (88.5 kg)   Constitutional: overweight, in NAD Eyes:  EOMI, no exophthalmos ENT: no neck masses, no cervical lymphadenopathy Cardiovascular: RRR, No MRG Respiratory: CTA B Musculoskeletal: no deformities Skin:no rashes Neurological: no tremor with outstretched hands  ASSESSMENT: 1. DM2, non-insulin-dependent, uncontrolled, with complications - CAD - CKD  Component     Latest Ref Rng & Units 01/14/2021  Glucose     65 - 99 mg/dL 122 (H)  Hemoglobin A1C     4.0 - 5.6 % 7.0 (A)  C-Peptide      0.80 - 3.85 ng/mL 2.54  Islet Cell Ab     Neg:<1:1 Negative  ZNT8 Antibodies     <15 U/mL <10  Glutamic Acid Decarb Ab     <5 IU/mL <5  No signs of insulin deficiency or pancreatic autoimmunity.  2. HL  3. MNG  PLAN:  1. Patient with longstanding, uncontrolled, type 2 diabetes, on oral antidiabetic regimen with metformin, sulfonylurea, SGLT2 inhibitor and GLP-1 receptor agonist, with worsening control.  He returns after 4-monthabsence.  At last visit, HbA1c was 7.5%.  This improved to 7.0% but at last check, few days ago, it was higher, at 7.3%. -At our last visit, sugars are higher than before,  especially in the morning as he had late dinners with his wife, was working late.  I advised him to add a low-dose of glipizide before dinner.  We also discussed about improving his meals. -We previously made sure that he did not have insulin deficiency and pancreatic autoimmunity. -At today's visit, sugars are mostly above target.  He mostly takes in the morning.  Occasionally, when he prepares to go fishing on Saturday mornings, he checks earlier, either before 5 am and sugars are usually lower then.  He mentions that he forgets to take glipizide before dinner about 50% of the time.  In the last 2 days, he did not forget to take it and sugars in the mornings are in the 120s.  However, since the blood sugars earlier in the morning, around 5 AM are actually lower, I am reticent to advise him to take glipizide every day before dinner due to fear of hypoglycemia overnight with compensatory, corrective, hyperglycemia in the morning.  Therefore, at today's visit, I advised him to only take the glipizide before larger dinners.  For better control, we discussed about possibly switching from Rybelsus to Ozempic but he has a large of Rybelsus at home.  Therefore, I did advise him to try to increase Rybelsus to 1.5 tablets a day.  We discussed that the newer studies with Rybelsus are done with higher doses, up to  50 mg daily.  At next visit, we can change to Ozempic if he is out of his Rybelsus. - I suggested to:  Patient Instructions  Please continue: - Metformin ER 1000 mg 2x a day with meals  - Farxiga 10 mg before b'fast  Change: - Glipizide 2.5 mg before a large dinner  Increase: - Rybelsus 14 + 7 mg before b'fast  Please return in 4 months with your sugar log.   - advised to check sugars at different times of the day - 1x a day, rotating check times - advised for yearly eye exams >> he is UTD - return to clinic in 4 months  2. HL -Reviewed latest lipid panel from 05/2022: Fractions at goal with the exception of a low HDL: Lab Results  Component Value Date   CHOL 102 06/24/2022   HDL 32 (L) 06/24/2022   LDLCALC 48 06/24/2022   TRIG 119 06/24/2022   CHOLHDL 3.2 06/24/2022  -He continues Crestor 40 mg daily and Vascepa 2 g twice a day without side effects -Discussed in the past about improving diet by reducing saturated fats and including more fiber  3. MNG -No neck compression symptoms -Previously investigated with ultrasound (in 2013-reviewed report) and also FNA (in 2019-reviewed report) and these were benign -Another thyroid ultrasound was obtained in 10/2019, which showed only 1 nodule measuring slightly less than 1 cm, the rest of the nodules are even smaller -Latest TSH was normal Lab Results  Component Value Date   TSH 1.95 10/27/2019  -No further follow-up is recommended for this  Philemon Kingdom, MD PhD [facility]

## 2022-07-03 NOTE — Patient Instructions (Addendum)
Please continue: - Metformin ER 1000 mg 2x a day with meals  - Farxiga 10 mg before b'fast  Change: - Glipizide 2.5 mg before a large dinner  Increase: - Rybelsus 14 + 7 mg before b'fast  Please return in 4 months with your sugar log.

## 2022-07-06 ENCOUNTER — Ambulatory Visit (HOSPITAL_COMMUNITY): Payer: BC Managed Care – PPO

## 2022-07-22 ENCOUNTER — Other Ambulatory Visit: Payer: Self-pay | Admitting: Internal Medicine

## 2022-07-22 DIAGNOSIS — E1159 Type 2 diabetes mellitus with other circulatory complications: Secondary | ICD-10-CM

## 2022-08-11 ENCOUNTER — Ambulatory Visit: Payer: BC Managed Care – PPO | Admitting: Dermatology

## 2022-08-13 ENCOUNTER — Other Ambulatory Visit: Payer: Self-pay | Admitting: Family Medicine

## 2022-08-13 ENCOUNTER — Other Ambulatory Visit: Payer: Self-pay | Admitting: Internal Medicine

## 2022-08-13 DIAGNOSIS — I10 Essential (primary) hypertension: Secondary | ICD-10-CM

## 2022-08-17 ENCOUNTER — Ambulatory Visit: Payer: BC Managed Care – PPO | Admitting: Family Medicine

## 2022-08-17 ENCOUNTER — Encounter: Payer: Self-pay | Admitting: Family Medicine

## 2022-08-17 DIAGNOSIS — J4521 Mild intermittent asthma with (acute) exacerbation: Secondary | ICD-10-CM | POA: Diagnosis not present

## 2022-08-17 MED ORDER — PREDNISONE 10 MG PO TABS: ORAL_TABLET | ORAL | 0 refills | Status: DC

## 2022-08-17 MED ORDER — AMOXICILLIN-POT CLAVULANATE 875-125 MG PO TABS: 1.0000 | ORAL_TABLET | Freq: Two times a day (BID) | ORAL | 0 refills | Status: DC

## 2022-08-17 NOTE — Progress Notes (Signed)
Subjective:    Patient ID: Austin Graham, male    DOB: 1951-05-31, 71 y.o.   MRN: [id number]   HPI: Austin Graham is a 71 y.o. male presenting for exposure to covid,but tested negative. Feels fatigue. Onset 11/9 with weak cough getting deeper. Yellow sputum.  A lot of wheezing now. Not dyspneic. Deep breath causes him to wheeze and cough. No fever. No body aches. Denies sore throat and earaches      06/24/2022   10:05 AM 05/28/2022   11:36 AM 05/14/2022    2:48 PM 12/18/2021    4:07 PM 12/18/2021    4:06 PM  Depression screen PHQ 2/9  Decreased Interest '1 1 1  1  '$ Down, Depressed, Hopeless 1 1 0  0  PHQ - 2 Score '2 2 1  1  '$ Altered sleeping '1 1 1 2   '$ Tired, decreased energy '2 2 2 3   '$ Change in appetite 0 0 0 1   Feeling bad or failure about yourself  0 0 0 0   Trouble concentrating 0 0 0 0   Moving slowly or fidgety/restless 0 0 0 0   Suicidal thoughts 0 0 0 0   PHQ-9 Score '5 5 4    '$ Difficult doing work/chores Not difficult at all Not difficult at all Not difficult at all       Relevant past medical, surgical, family and social history reviewed and updated as indicated.  Interim medical history since our last visit reviewed. Allergies and medications reviewed and updated.  ROS:  Review of Systems  Constitutional:  Negative for fever.  Respiratory:  Positive for cough and wheezing. Negative for shortness of breath.   Cardiovascular:  Negative for chest pain.  Musculoskeletal:  Negative for arthralgias.  Skin:  Negative for rash.     Social History   Tobacco Use  Smoking Status Former   Types: Cigarettes   Quit date: 11/02/1974   Years since quitting: 47.8  Smokeless Tobacco Never       Objective:     Wt Readings from Last 3 Encounters:  07/03/22 195 lb 6.4 oz (88.6 kg)  06/24/22 195 lb (88.5 kg)  05/28/22 195 lb (88.5 kg)     Exam deferred. Pt. Harboring due to COVID 19. Phone visit performed.   Assessment & Plan:   1. Mild intermittent asthmatic  bronchitis with acute exacerbation     Meds ordered this encounter  Medications   predniSONE (DELTASONE) 10 MG tablet    Sig: Take 5 daily for 2 days followed by 4,3,2 and 1 for 2 days each.    Dispense:  30 tablet    Refill:  0   amoxicillin-clavulanate (AUGMENTIN) 875-125 MG tablet    Sig: Take 1 tablet by mouth 2 (two) times daily. Take all of this medication    Dispense:  20 tablet    Refill:  0    No orders of the defined types were placed in this encounter.     Diagnoses and all orders for this visit:  Mild intermittent asthmatic bronchitis with acute exacerbation  Other orders -     predniSONE (DELTASONE) 10 MG tablet; Take 5 daily for 2 days followed by 4,3,2 and 1 for 2 days each. -     amoxicillin-clavulanate (AUGMENTIN) 875-125 MG tablet; Take 1 tablet by mouth 2 (two) times daily. Take all of this medication    Virtual Visit via telephone Note  I discussed the limitations, risks, security and privacy concerns  of performing an evaluation and management service by telephone and the availability of in person appointments. The patient was identified with two identifiers. Pt.expressed understanding and agreed to proceed. Pt. Is at home. Dr. Livia Snellen is in his office.  Follow Up Instructions:   I discussed the assessment and treatment plan with the patient. The patient was provided an opportunity to ask questions and all were answered. The patient agreed with the plan and demonstrated an understanding of the instructions.   The patient was advised to call back or seek an in-person evaluation if the symptoms worsen or if the condition fails to improve as anticipated.   Total minutes including chart review and phone contact time: 12   Follow up plan: Return if symptoms worsen or fail to improve.  Claretta Fraise, MD [place]

## 2022-09-03 ENCOUNTER — Other Ambulatory Visit: Payer: Self-pay | Admitting: Cardiology

## 2022-09-30 ENCOUNTER — Other Ambulatory Visit: Payer: Self-pay | Admitting: Family Medicine

## 2022-10-01 MED ORDER — TAMSULOSIN HCL 0.4 MG PO CAPS: 0.4000 mg | ORAL_CAPSULE | Freq: Every day | ORAL | 1 refills | Status: DC

## 2022-10-01 NOTE — Addendum Note (Signed)
Addended by: Antonietta Barcelona D on: 10/01/2022 04:31 PM   Modules accepted: Orders

## 2022-10-05 ENCOUNTER — Ambulatory Visit: Payer: BC Managed Care – PPO | Admitting: Family Medicine

## 2022-10-05 ENCOUNTER — Encounter: Payer: Self-pay | Admitting: Family Medicine

## 2022-10-05 VITALS — BP 122/75 | HR 88 | Temp 98.5°F | Ht 72.0 in | Wt 195.0 lb

## 2022-10-05 DIAGNOSIS — R110 Nausea: Secondary | ICD-10-CM | POA: Diagnosis not present

## 2022-10-05 DIAGNOSIS — R1013 Epigastric pain: Secondary | ICD-10-CM | POA: Diagnosis not present

## 2022-10-05 LAB — CULTURE, GROUP A STREP

## 2022-10-05 LAB — RAPID STREP SCREEN (MED CTR MEBANE ONLY): Strep Gp A Ag, IA W/Reflex: NEGATIVE

## 2022-10-05 NOTE — Progress Notes (Signed)
BP 122/75   Pulse 88   Temp 98.5 F (36.9 C)   Ht 6' (1.829 m)   Wt 195 lb (88.5 kg)   SpO2 100%   BMI 26.45 kg/m    Subjective:   Patient ID: Austin Graham, male    DOB: Nov 14, 1950, 72 y.o.   MRN: [id number]  HPI: Austin Graham is a 72 y.o. male presenting on 10/05/2022 for Abdominal Pain and Nausea   HPI Abdominal pain in the epigastric region with nausea. Patient says it started the day after Christmas which is a few weeks ago with abdominal pain in the epigastric region with nausea.  He says it was Going on for couple weeks and then he had his daughter who is a Pharmacist, hospital had some exposure to strep and so they were concerned about it.  He says he has been feeling better the past 2 days all of a sudden but still wanted to come in to be tested for strep just because of the possible exposure.  He says he did take some Prevacid about a week ago and did feel like things started to get better.  Relevant past medical, surgical, family and social history reviewed and updated as indicated. Interim medical history since our last visit reviewed. Allergies and medications reviewed and updated.  Review of Systems  Constitutional:  Negative for chills and fever.  Respiratory:  Negative for shortness of breath and wheezing.   Cardiovascular:  Negative for chest pain and leg swelling.  Gastrointestinal:  Positive for abdominal pain and nausea. Negative for blood in stool, diarrhea and vomiting.  Musculoskeletal:  Negative for back pain and gait problem.  Skin:  Negative for rash.  All other systems reviewed and are negative.   Per HPI unless specifically indicated above   Allergies as of 10/05/2022       Reactions   Codeine    REACTION: agitation   Erythromycin    REACTION: diarrhea   Latex    rash   Other    Melon- itchy throat        Medication List        Accurate as of October 05, 2022 10:42 AM. If you have any questions, ask your nurse or doctor.          STOP  taking these medications    amoxicillin-clavulanate 875-125 MG tablet Commonly known as: AUGMENTIN Stopped by: Worthy Rancher, MD   predniSONE 10 MG tablet Commonly known as: DELTASONE Stopped by: Fransisca Kaufmann Agusta Hackenberg, MD       TAKE these medications    aspirin 81 MG tablet Take by mouth daily.   Farxiga 10 MG Tabs tablet Generic drug: dapagliflozin propanediol TAKE 1 TABLET BY MOUTH EVERY DAY BEFORE BREAKFAST   FreeStyle Libre 3 Sensor Misc 1 each by Does not apply route every 14 (fourteen) days.   glipiZIDE 5 MG tablet Commonly known as: GLUCOTROL TAKE 1/2 TABLET (2.5 MG TOTAL) BY MOUTH DAILY BEFORE SUPPER.   ibuprofen 200 MG tablet Commonly known as: ADVIL Take 600 mg by mouth every 6 (six) hours as needed.   metFORMIN 500 MG 24 hr tablet Commonly known as: GLUCOPHAGE-XR TAKE 2 TABLETS (1,000 MG TOTAL) BY MOUTH 2 (TWO) TIMES DAILY WITH A MEAL.   olmesartan 20 MG tablet Commonly known as: BENICAR TAKE 1/2 TABLET BY MOUTH EVERY DAY   OneTouch Delica Lancets 79T Misc Check 1x a day   OneTouch Verio test strip Generic drug: glucose blood USE AS INSTRUCTED TO  TEST SUGAR   rosuvastatin 40 MG tablet Commonly known as: CRESTOR TAKE 1 TABLET BY MOUTH EVERY DAY   Rybelsus 14 MG Tabs Generic drug: Semaglutide TAKE 1 TABLET ('14MG'$ ) BY MOUTH ONCE DAILY   tamsulosin 0.4 MG Caps capsule Commonly known as: FLOMAX Take 1 capsule (0.4 mg total) by mouth daily.   Vascepa 1 g capsule Generic drug: icosapent Ethyl TAKE 2 CAPSULES BY MOUTH 2 TIMES DAILY.   Vitamin D 50 MCG (2000 UT) Caps Take 1 capsule by mouth daily.   Vitamins/Minerals Tabs Take by mouth.         Objective:   BP 122/75   Pulse 88   Temp 98.5 F (36.9 C)   Ht 6' (1.829 m)   Wt 195 lb (88.5 kg)   SpO2 100%   BMI 26.45 kg/m   Wt Readings from Last 3 Encounters:  10/05/22 195 lb (88.5 kg)  07/03/22 195 lb 6.4 oz (88.6 kg)  06/24/22 195 lb (88.5 kg)    Physical Exam Vitals and  nursing note reviewed.  Constitutional:      General: He is not in acute distress.    Appearance: He is well-developed. He is not diaphoretic.  Eyes:     General: No scleral icterus.    Conjunctiva/sclera: Conjunctivae normal.  Neck:     Thyroid: No thyromegaly.  Cardiovascular:     Rate and Rhythm: Normal rate and regular rhythm.     Heart sounds: Normal heart sounds. No murmur heard. Pulmonary:     Effort: Pulmonary effort is normal. No respiratory distress.     Breath sounds: Normal breath sounds. No wheezing.  Abdominal:     General: Abdomen is flat. There is no distension.     Palpations: Abdomen is soft.     Tenderness: There is no abdominal tenderness (No tenderness on exam today). There is no right CVA tenderness, left CVA tenderness, guarding or rebound.  Skin:    General: Skin is warm and dry.     Findings: No rash.  Neurological:     Mental Status: He is alert and oriented to person, place, and time.     Coordination: Coordination normal.  Psychiatric:        Behavior: Behavior normal.       Assessment & Plan:   Problem List Items Addressed This Visit   None Visit Diagnoses     Nausea    -  Primary   Relevant Orders   Rapid Strep Screen ([facility] ONLY)   Epigastric pain       Relevant Orders   Rapid Strep Screen ([facility] ONLY)     Continue with the Prevacid as needed, it sounds like symptoms are better.  Strep was negative.  Likely GERD  Follow up plan: Return if symptoms worsen or fail to improve.  Counseling provided for all of the vaccine components Orders Placed This Encounter  Procedures   Rapid Strep Screen ([facility] ONLY)    Caryl Pina, MD [facility] 10/05/2022, 10:42 AM

## 2022-11-05 ENCOUNTER — Other Ambulatory Visit: Payer: Self-pay | Admitting: Cardiology

## 2022-11-05 ENCOUNTER — Other Ambulatory Visit: Payer: Self-pay | Admitting: Internal Medicine

## 2022-11-06 ENCOUNTER — Ambulatory Visit: Payer: BC Managed Care – PPO | Admitting: Internal Medicine

## 2022-11-28 ENCOUNTER — Other Ambulatory Visit: Payer: Self-pay | Admitting: Cardiology

## 2022-12-04 ENCOUNTER — Ambulatory Visit (INDEPENDENT_AMBULATORY_CARE_PROVIDER_SITE_OTHER): Payer: BC Managed Care – PPO | Admitting: Internal Medicine

## 2022-12-04 ENCOUNTER — Encounter: Payer: Self-pay | Admitting: Internal Medicine

## 2022-12-04 VITALS — BP 140/90 | HR 89 | Ht 72.0 in | Wt 200.6 lb

## 2022-12-04 DIAGNOSIS — E1165 Type 2 diabetes mellitus with hyperglycemia: Secondary | ICD-10-CM

## 2022-12-04 DIAGNOSIS — E042 Nontoxic multinodular goiter: Secondary | ICD-10-CM | POA: Diagnosis not present

## 2022-12-04 DIAGNOSIS — E1159 Type 2 diabetes mellitus with other circulatory complications: Secondary | ICD-10-CM

## 2022-12-04 DIAGNOSIS — E782 Mixed hyperlipidemia: Secondary | ICD-10-CM

## 2022-12-04 LAB — POCT GLYCOSYLATED HEMOGLOBIN (HGB A1C): Hemoglobin A1C: 8 % — AB (ref 4.0–5.6)

## 2022-12-04 NOTE — Patient Instructions (Addendum)
Please continue: - Metformin ER 1000 mg 2x a day with meals  - Farxiga 10 mg before b'fast  Use: - Glipizide 2.5-5 mg before dinner - Rybelsus 14 + 7 mg before b'fast  Please return in 4 months with your sugar log.

## 2022-12-04 NOTE — Progress Notes (Signed)
Patient ID: Austin Graham, male   DOB: April 05, 1951, 72 y.o.   MRN: [id number]   HPI: Austin Graham is a 72 y.o.-year-old male, initially referred by his cardiologist, Dr. Percival Spanish, returning for follow-up for DM2, dx in ~2013, non-insulin-dependent, uncontrolled, with complications (CAD, CKD) and also thyroid nodules.Last visit 6 months ago.  Interim history: No blurry vision, nausea, chest pain. Before last visit, he relaxed his diet as he was taking care of his grandson and eating more snacks with him.  However, right before last visit he started to walk more, in the park. At this visit, he relaxed his diet. Sugars are higher.  Reviewed history: I saw the patient first in 09/2019.  At that time, we stopped Janumet and started metformin ER, we increased Rybelsus dose and we added [place].  He developed diarrhea, indigestion, loss of appetite, nausea, after the visit, and he stopped all of his medicines.  The symptoms resolved and I advised him to start adding back the medicines one by one.  However, he was lost for follow-up afterwards and returned to see me in 12/2020.  Reviewed latest HbA1c levels: 06/2022: HbA1c 7.5% Lab Results  Component Value Date   HGBA1C 7.3 (H) 06/24/2022   HGBA1C 7.0 (H) 12/18/2021   HGBA1C 7.5 (A) 10/03/2021   HGBA1C 6.7 (H) 04/03/2021   HGBA1C 7.0 (A) 01/14/2021   HGBA1C 8.7 (H) 07/19/2020   HGBA1C 7.5 (H) 01/18/2020   HGBA1C 8.9 (A) 10/27/2019   HGBA1C 7.5 (H) 07/07/2019   HGBA1C 7.2 (H) 03/27/2019   He is on: - Metformin ER 1000 mg 2x a day with meals (L and D) - Glipizide 2.5 mg before larger dinners >> before dinner - but forgets - Rybelsus 7 >> 14 >>  (forgot) >> 14 mg daily - Farxiga 10 mg before b'fast He was previously on Trulicity >> nausea, lightheadedness.  Pt checks his sugars 1-2x a day and they are: - am: 115-184 >> 89, 125 at 5 am,  114-199 (pizza) >> 128-180, 213 - 2h after b'fast: 113-139, 167 >> 151-196 >> 164, 178 >>  236 -  before lunch: n/c >> 139, 180, 248 >> 128 >> 101, 144 >> n/c >> 114-130 - 2h after lunch: 122-268, 308 >> 90-127 >> 168-196 >> 172 >> n/c - before dinner: n/c >> 112 >> 88 - 2h after dinner: n/c >> 118, 134 >> 106 >> 114-163 >> n/c - bedtime: 180-220 >> n/c >> 164 >> n/c - nighttime: n/c >> 114, 153 >> n/c Lowest sugar was 138 >> 120 >> 98 >> 101 >> 89 >> 88; ? hypoglycemia awareness..  Highest sugar was 303 >> 308 >> 189 >> 196 >> 317 >> 236.  Glucometer: One Touch ultra  Pt's meals are: - Breakfast: oatmeal, cereals, granola - Lunch: sandwich on rye or whole wheat; bacon and eggs - Dinner: vegetable, potatoes, meat or pasta - Snacks: 2-3 -stopped sweets  - + mild CKD, last BUN/creatinine:  Lab Results  Component Value Date   BUN 18 05/28/2022   BUN 20 05/14/2022   CREATININE 1.25 05/28/2022   CREATININE 1.32 (H) 05/14/2022  On Benicar 20.  -+ HL; last set of lipids: Lab Results  Component Value Date   CHOL 102 06/24/2022   HDL 32 (L) 06/24/2022   LDLCALC 48 06/24/2022   TRIG 119 06/24/2022   CHOLHDL 3.2 06/24/2022  On Crestor 40, Vascepa.  - last eye exam was on 09/30/2022: No DR reportedly.   - no numbness and tingling  in his feet. He has a foor sput - plantar fasciitis.  Last foot exam 06/20/2022.  Pt has FH of DM in F, S, GM.  He also has a history of HTN, GERD, multinodular goiter.  Thyroid ultrasound (07/05/2012) showed very small nodules, stable: Right thyroid lobe:  5.0 x 1.7 x 2.3 cm. Left thyroid lobe:  5.6 x 1.9 x 2.4 cm. Isthmus:   Focal nodules:  Multiple bilateral small thyroid nodules.  The largest on the right is 10 mm in the lower pole compared 13 mm previously.  The largest on the left is 10 mm in the mid pole compared to 9 mm on the previous study.  No significant change.  No dominant nodule.   Lymphadenopathy:   None.   IMPRESSION: Numerous bilateral small thyroid nodules, none significantly changed.  He saw Dr. Dalbert Batman in the past and had  FNAs:   Date Taken: 05/10/2008   Satisfactory for evaluation.    INTERPRETATION(S):   THYROID, LEFT, FINE NEEDLE ASPIRATION (THIN PREP, SMEARS BLOCK):   - FINDINGS CONSISTENT WITH NON-NEOPLASTIC GOITER.   - SEE COMMENT.   Date Taken: 05/10/2008  Satisfactory for evaluation.    INTERPRETATION(S):   THYROID, RIGHT, FINE NEEDLE ASPIRATION (THIN PREP, SMEARS BLOCK):   - FINDINGS CONSISTENT WITH NON-NEOPLASTIC GOITER.   - SEE COMMENT.   Thyroid ultrasound (11/03/2019): Parenchymal Echotexture: Moderately heterogenous Isthmus: 0.4 cm thickness, stable Right lobe: 5.6 x 1.7 x 2.1 cm, previously 5.3 x 2.1 x 2.1 Left lobe: 4.9 x 2 x 1.9 cm, previously 5.4 x 2.2 x 1.7 _____________________________________________________________   Nodule # 1: Prior biopsy: No Location: Right; Inferior Maximum size: 0.98 cm; Other 2 dimensions: 0.8 x 0.8 cm, previously, 0.7 x 0.6 x 0.6 cm  Composition: mixed cystic and solid (1)  Echogenicity: hypoechoic (2) Significant change in size (>/= 20% in two dimensions and minimal increase of 2 mm): Yes *Given size (<1.4 cm) and appearance, this nodule does NOT meet TI-RADS criteria for biopsy or dedicated follow-up. ________________________________________________________   0.6 cm mixed solid/cystic inferior left nodule, previously 0.7; This nodule does NOT meet TI-RADS criteria for biopsy or dedicated follow-up.   Additional scattered hypoechoic lesions throughout both lobes all less than 0.5 cm diameter.   IMPRESSION: 1. Stable mild thyromegaly with scattered subcentimeter nodules. None meet criteria for biopsy or dedicated imaging follow-up.  Reviewed latest TSH: Lab Results  Component Value Date   TSH 1.95 10/27/2019   TSH 2.710 02/10/2018   TSH 3.820 04/01/2016   Pt denies: - feeling nodules in neck - hoarseness - dysphagia - choking  No FH of thyroid cancer. No h/o radiation tx to head or neck. +FH of thyroid ds in sister, GM  He also  has BPH with nocturia.  ROS: + See HPI  I reviewed pt's medications, allergies, PMH, social hx, family hx, and changes were documented in the history of present illness. Otherwise, unchanged from my initial visit note.  Past Medical History:  Diagnosis Date   Arthritis    Asthma    as child   Complication of anesthesia    hard to waken   Diabetes ([place])    type II   Diverticulosis    Dysrhythmia    PAC's   Goiter    History of hiatal hernia    Hyperlipidemia    Hypertension    Kidney stones    Stress fracture    left knee, right hip   Thyroid disease    Past Surgical History:  Procedure Laterality Date   CYSTOSCOPY WITH INSERTION OF UROLIFT     JOINT REPLACEMENT     KIDNEY STONES  2006   SKIN CANCER DESTRUCTION Left    deltoid- [facility]   SKIN CANCER EXCISION Right 09/16/2018   shoulder    TONSILLECTOMY     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   Procedure: RIGHT TOTAL HIP ARTHROPLASTY ANTERIOR APPROACH;  Surgeon: Gaynelle Arabian, MD;  Location: [facility];  Service: Orthopedics;  Laterality: Right;   WISDOM TOOTH EXTRACTION     Social History   Socioeconomic History   Marital status: Married    Spouse name: Not on file   Number of children: 3   Years of education: Not on file   Highest education level: Not on file  Occupational History    Employer:  Funeral director's assistant  Tobacco Use   Smoking status: Former Smoker    Quit date: 11/02/1974    Years since quitting: 45.0   Smokeless tobacco: Never Used  Substance and Sexual Activity   Alcohol use: Yes    Alcohol/week: 1.0 - 2.0 standard drinks    Types: 1 - 2 Standard drinks or equivalent per week    Comment: 2-3 PER WEEK   Drug use: no    Types:    Sexual activity: Not on file  Other Topics Concern   Not on file  Social History Narrative   Lives at home with wife.     Social Determinants of Health   Financial Resource Strain:    Difficulty of Paying Living Expenses: Not on file  Food Insecurity:     Worried About Charity fundraiser in the Last Year: Not on file   [employer] of Food in the Last Year: Not on file  Transportation Needs:    Lack of Transportation (Medical): Not on file   Lack of Transportation (Non-Medical): Not on file  Physical Activity:    Days of Exercise per Week: Not on file   Minutes of Exercise per Session: Not on file  Stress:    Feeling of Stress : Not on file  Social Connections:    Frequency of Communication with Friends and Family: Not on file   Frequency of Social Gatherings with Friends and Family: Not on file   Attends Religious Services: Not on file   Active Member of Clubs or Organizations: Not on file   Attends Archivist Meetings: Not on file   Marital Status: Not on file  Intimate Partner Violence:    Fear of Current or Ex-Partner: Not on file   Emotionally Abused: Not on file   Physically Abused: Not on file   Sexually Abused: Not on file   Current Outpatient Medications on File Prior to Visit  Medication Sig Dispense Refill   aspirin 81 MG tablet Take by mouth daily.      Cholecalciferol (VITAMIN D) 2000 units CAPS Take 1 capsule by mouth daily.     Continuous Blood Gluc Sensor (FREESTYLE LIBRE 3 SENSOR) MISC 1 each by Does not apply route every 14 (fourteen) days. 6 each 3   FARXIGA 10 MG TABS tablet TAKE 1 TABLET BY MOUTH EVERY DAY BEFORE BREAKFAST 90 tablet 1   glipiZIDE (GLUCOTROL) 5 MG tablet TAKE 1/2 TABLET (2.5 MG TOTAL) BY MOUTH DAILY BEFORE SUPPER. 45 tablet 3   ibuprofen (ADVIL) 200 MG tablet Take 600 mg by mouth every 6 (six) hours as needed.     metFORMIN (GLUCOPHAGE-XR) 500 MG 24 hr  tablet TAKE 2 TABLETS (1,000 MG TOTAL) BY MOUTH 2 (TWO) TIMES DAILY WITH A MEAL. 360 tablet 3   olmesartan (BENICAR) 20 MG tablet TAKE 1/2 TABLET BY MOUTH EVERY DAY 45 tablet 1   OneTouch Delica Lancets 99991111 MISC Check 1x a day 100 each 3   ONETOUCH VERIO test strip USE AS INSTRUCTED TO TEST SUGAR 100 strip 3   rosuvastatin (CRESTOR) 40 MG  tablet TAKE 1 TABLET BY MOUTH EVERY DAY 30 tablet 1   Semaglutide (RYBELSUS) 14 MG TABS TAKE 1 TABLET ('14MG'$ ) BY MOUTH ONCE DAILY 90 tablet 1   tamsulosin (FLOMAX) 0.4 MG CAPS capsule Take 1 capsule (0.4 mg total) by mouth daily. 90 capsule 1   VASCEPA 1 g capsule TAKE 2 CAPSULES BY MOUTH 2 TIMES DAILY. 360 capsule 1   Vitamins/Minerals TABS Take by mouth.     No current facility-administered medications on file prior to visit.   Allergies  Allergen Reactions   Codeine     REACTION: agitation   Erythromycin     REACTION: diarrhea   Latex     rash   Other     Melon- itchy throat   Family History  Problem Relation Age of Onset   Breast cancer Mother 34   Hypertension Mother    Cancer Mother    CAD Father 81       Died age 33 after CABG   Stroke Father    Hypertension Father    AAA (abdominal aortic aneurysm) Father    Hyperlipidemia Father    Heart disease Father    Cancer Sister        ovarian   Diabetes Sister    Thyroid disease Sister    Colon polyps Neg Hx    Colon cancer Neg Hx    Esophageal cancer Neg Hx    Stomach cancer Neg Hx    Rectal cancer Neg Hx    PE: BP (!) 140/90 (BP Location: Left Arm, Patient Position: Sitting, Cuff Size: Normal)   Pulse 89   Ht 6' (1.829 m)   Wt 200 lb 9.6 oz (91 kg)   SpO2 99%   BMI 27.21 kg/m  Wt Readings from Last 3 Encounters:  12/04/22 200 lb 9.6 oz (91 kg)  10/05/22 195 lb (88.5 kg)  07/03/22 195 lb 6.4 oz (88.6 kg)   Constitutional: overweight, in NAD Eyes:  EOMI, no exophthalmos ENT: no neck masses, no cervical lymphadenopathy Cardiovascular: RRR, No MRG Respiratory: CTA B Musculoskeletal: no deformities Skin:no rashes Neurological: no tremor with outstretched hands  ASSESSMENT: 1. DM2, non-insulin-dependent, uncontrolled, with complications - CAD - CKD  Component     Latest Ref Rng & Units 01/14/2021  Glucose     65 - 99 mg/dL 122 (H)  Hemoglobin A1C     4.0 - 5.6 % 7.0 (A)  C-Peptide     0.80 - 3.85  ng/mL 2.54  Islet Cell Ab     Neg:<1:1 Negative  ZNT8 Antibodies     <15 U/mL <10  Glutamic Acid Decarb Ab     <5 IU/mL <5  No signs of insulin deficiency or pancreatic autoimmunity.  2. HL  3. MNG  PLAN:  1. Patient with longstanding, uncontrolled, type 2 diabetes, on oral antidiabetic regimen with metformin, prn sulfonylurea, SGLT2 inhibitor and GLP-1 receptor agonist, with worsening control at last visit.  At that time, HbA1c was higher, at 7.3%.  He had another HbA1c obtained by PCP approximately 1 month after our last  visit and it was even higher, at 7.5%.   -At last visit, sugars were higher than before, mostly above target.  He was mostly checking them in the morning sometimes quite early, before 5 AM, which he was going fishing.  They were usually lower at that time.  He was forgetting to take glipizide before dinner about 50% of the time.  When he was taking it consistently, sugars were at goal in the morning, in the 120s.  However, since sugars were lower around 5 AM, I was reticent to advise him to take the glipizide every day and we discussed about possibly switching from Rybelsus to [place].  However, he had a lot of Rybelsus at home and I advised him to increase the dose to 21 mg daily.  This is higher than the recommended dose, however, the new studies are actually done with 50 mg daily. -At this visit, he tells me he forgot to increase the Rybelsus.  He is still taking 40 mg daily.  Also, he frequently forgets glipizide before dinner.  Sugars are high in the morning but better later in the day, although he checks very infrequently.  The highest blood sugar that I see in his log is after cereals.  We discussed that he may need to stop these.  We discussed about healthier options for breakfast and also the rest of the meals.  I also encouraged exercise, which should greatly help with keeping the blood sugars under control, also.  He is interested in making healthy dietary changes.  For  now, he would like to stay with Rybelsus and increase the dose as he still has some of this at home.  We can also try to increase the glipizide with larger meals but definitely to try to remember to take it before his dinners.  I advised him to get in touch with me in a month to see if we need to switch to Ozempic at that time. - I suggested to:  Patient Instructions  Please continue: - Metformin ER 1000 mg 2x a day with meals  - Farxiga 10 mg before b'fast  Use: - Glipizide 2.5-5 mg before dinner - Rybelsus 14 + 7 mg before b'fast  Please return in 4 months with your sugar log.   - we checked his HbA1c: 8% (even higher) - advised to check sugars at different times of the day - 1x a day, rotating check times - advised for yearly eye exams >> he is UTD - return to clinic in 4 months  2. HL -Reviewed latest lipid panel from 05/2022: Fractions at goal with exception of a low HDL: Lab Results  Component Value Date   CHOL 102 06/24/2022   HDL 32 (L) 06/24/2022   LDLCALC 48 06/24/2022   TRIG 119 06/24/2022   CHOLHDL 3.2 06/24/2022  -He continues on Crestor 40 mg daily and Vascepa 2 g twice a day without side effects -Discussed in the past and again today about improving diet by reducing saturated fats and including more fiber  3. MNG -No neck compression symptoms -This was previously investigated by ultrasound  In 2013 and also FNA in 2019-benign -Another ultrasound was obtained in 10/2019 and this showed only 1 nodule measuring slightly less than 1 cm, with the rest of the nodules even smaller -No further imaging is recommended -Latest TSH was reviewed and this was normal: Lab Results  Component Value Date   TSH 1.95 10/27/2019   Philemon Kingdom, MD PhD [facility] Endocrinology

## 2022-12-23 ENCOUNTER — Ambulatory Visit: Payer: BC Managed Care – PPO | Admitting: Family Medicine

## 2022-12-23 ENCOUNTER — Encounter: Payer: Self-pay | Admitting: Family Medicine

## 2022-12-23 VITALS — BP 113/75 | HR 89 | Ht 72.0 in | Wt 198.0 lb

## 2022-12-23 DIAGNOSIS — E1169 Type 2 diabetes mellitus with other specified complication: Secondary | ICD-10-CM | POA: Diagnosis not present

## 2022-12-23 DIAGNOSIS — I10 Essential (primary) hypertension: Secondary | ICD-10-CM

## 2022-12-23 DIAGNOSIS — E782 Mixed hyperlipidemia: Secondary | ICD-10-CM

## 2022-12-23 NOTE — Progress Notes (Signed)
BP 113/75   Pulse 89   Ht 6' (1.829 m)   Wt 198 lb (89.8 kg)   SpO2 99%   BMI 26.85 kg/m    Subjective:   Patient ID: Austin Graham, male    DOB: January 15, 1951, 72 y.o.   MRN: [id number]  HPI: Austin Graham is a 72 y.o. male presenting on 12/23/2022 for Medical Management of Chronic Issues and Diabetes   HPI Type 2 diabetes mellitus Patient comes in today for recheck of his diabetes. Patient has been currently taking glipizide and Farxiga and Rybelsus, sees endocrinology as well, A1c was 8.0 a few weeks ago. Patient is not currently on an ACE inhibitor/ARB. Patient has not seen an ophthalmologist this year. Patient denies any issues with their feet. The symptom started onset as an adult hypertension and hyperlipidemia ARE RELATED TO DM   Hypertension Patient is currently on olmesartan, and their blood pressure today is 113/75. Patient denies any lightheadedness or dizziness. Patient denies headaches, blurred vision, chest pains, shortness of breath, or weakness. Denies any side effects from medication and is content with current medication.   Hyperlipidemia Patient is coming in for recheck of his hyperlipidemia. The patient is currently taking Crestor and Vascepa although he says has been out of the Vascepa for couple weeks.. They deny any issues with myalgias or history of liver damage from it. They deny any focal numbness or weakness or chest pain.   Relevant past medical, surgical, family and social history reviewed and updated as indicated. Interim medical history since our last visit reviewed. Allergies and medications reviewed and updated.  Review of Systems  Constitutional:  Negative for chills and fever.  Eyes:  Negative for visual disturbance.  Respiratory:  Negative for shortness of breath and wheezing.   Cardiovascular:  Negative for chest pain and leg swelling.  Musculoskeletal:  Negative for back pain and gait problem.  Skin:  Negative for rash.  Neurological:   Negative for dizziness, weakness and light-headedness.  All other systems reviewed and are negative.   Per HPI unless specifically indicated above   Allergies as of 12/23/2022       Reactions   Codeine    REACTION: agitation   Erythromycin    REACTION: diarrhea   Latex    rash   Other    Melon- itchy throat        Medication List        Accurate as of December 23, 2022 11:09 AM. If you have any questions, ask your nurse or doctor.          aspirin 81 MG tablet Take by mouth daily.   Farxiga 10 MG Tabs tablet Generic drug: dapagliflozin propanediol TAKE 1 TABLET BY MOUTH EVERY DAY BEFORE BREAKFAST   FreeStyle Libre 3 Sensor Misc 1 each by Does not apply route every 14 (fourteen) days.   glipiZIDE 5 MG tablet Commonly known as: GLUCOTROL TAKE 1/2 TABLET (2.5 MG TOTAL) BY MOUTH DAILY BEFORE SUPPER.   ibuprofen 200 MG tablet Commonly known as: ADVIL Take 600 mg by mouth every 6 (six) hours as needed.   metFORMIN 500 MG 24 hr tablet Commonly known as: GLUCOPHAGE-XR TAKE 2 TABLETS (1,000 MG TOTAL) BY MOUTH 2 (TWO) TIMES DAILY WITH A MEAL.   olmesartan 20 MG tablet Commonly known as: BENICAR TAKE 1/2 TABLET BY MOUTH EVERY DAY   OneTouch Delica Lancets 99991111 Misc Check 1x a day   OneTouch Verio test strip Generic drug: glucose blood USE AS INSTRUCTED  TO TEST SUGAR   rosuvastatin 40 MG tablet Commonly known as: CRESTOR TAKE 1 TABLET BY MOUTH EVERY DAY   Rybelsus 14 MG Tabs Generic drug: Semaglutide TAKE 1 TABLET (14MG ) BY MOUTH ONCE DAILY   tamsulosin 0.4 MG Caps capsule Commonly known as: FLOMAX Take 1 capsule (0.4 mg total) by mouth daily.   Vascepa 1 g capsule Generic drug: icosapent Ethyl TAKE 2 CAPSULES BY MOUTH 2 TIMES DAILY.   Vitamin D 50 MCG (2000 UT) Caps Take 1 capsule by mouth daily.   Vitamins/Minerals Tabs Take by mouth.         Objective:   BP 113/75   Pulse 89   Ht 6' (1.829 m)   Wt 198 lb (89.8 kg)   SpO2 99%   BMI  26.85 kg/m   Wt Readings from Last 3 Encounters:  12/23/22 198 lb (89.8 kg)  12/04/22 200 lb 9.6 oz (91 kg)  10/05/22 195 lb (88.5 kg)    Physical Exam Vitals and nursing note reviewed.  Constitutional:      General: He is not in acute distress.    Appearance: He is well-developed. He is not diaphoretic.  Eyes:     General: No scleral icterus.    Conjunctiva/sclera: Conjunctivae normal.  Neck:     Thyroid: No thyromegaly.  Cardiovascular:     Rate and Rhythm: Normal rate and regular rhythm.     Heart sounds: Normal heart sounds. No murmur heard. Pulmonary:     Effort: Pulmonary effort is normal. No respiratory distress.     Breath sounds: Normal breath sounds. No wheezing.  Musculoskeletal:        General: No swelling. Normal range of motion.     Cervical back: Neck supple.  Lymphadenopathy:     Cervical: No cervical adenopathy.  Skin:    General: Skin is warm and dry.     Findings: No rash.  Neurological:     Mental Status: He is alert and oriented to person, place, and time.     Coordination: Coordination normal.  Psychiatric:        Behavior: Behavior normal.     Results for orders placed or performed in visit on 12/04/22  POCT glycosylated hemoglobin (Hb A1C)  Result Value Ref Range   Hemoglobin A1C 8.0 (A) 4.0 - 5.6 %   HbA1c POC (<> result, manual entry)     HbA1c, POC (prediabetic range)     HbA1c, POC (controlled diabetic range)      Assessment & Plan:   Problem List Items Addressed This Visit       Cardiovascular and Mediastinum   Hypertension   Relevant Orders   CBC with Differential/Platelet   CMP14+EGFR   Lipid panel   Microalbumin / creatinine urine ratio     Endocrine   Diabetes mellitus ([place]) - Primary   Relevant Orders   CBC with Differential/Platelet   CMP14+EGFR   Lipid panel   Microalbumin / creatinine urine ratio     Other   Hyperlipidemia   Relevant Orders   CBC with Differential/Platelet   CMP14+EGFR   Lipid panel    Microalbumin / creatinine urine ratio    Will check other blood work, A1c was up a couple weeks ago and they made some adjustments on his medicines already.  Recommend for him to retry the Vascepa and the coupon program in a couple weeks because it was hacked and nationally and has been down.   Follow up plan: Return in about  6 months (around 06/25/2023), or if symptoms worsen or fail to improve, for Diabetes hypertension close.  Counseling provided for all of the vaccine components Orders Placed This Encounter  Procedures   CBC with Differential/Platelet   CMP14+EGFR   Lipid panel   Microalbumin / creatinine urine ratio    Caryl Pina, MD [facility] 12/23/2022, 11:09 AM

## 2022-12-24 LAB — LIPID PANEL
Chol/HDL Ratio: 2.8 ratio (ref 0.0–5.0)
Cholesterol, Total: 96 mg/dL — ABNORMAL LOW (ref 100–199)
HDL: 34 mg/dL — ABNORMAL LOW (ref >39)
LDL Chol Calc (NIH): 41 mg/dL (ref 0–99)
Triglycerides: 114 mg/dL (ref 0–149)
VLDL Cholesterol Cal: 21 mg/dL (ref 5–40)

## 2022-12-24 LAB — CBC WITH DIFFERENTIAL/PLATELET
Basophils Absolute: 0.1 10*3/uL (ref 0.0–0.2)
Basos: 1 %
EOS (ABSOLUTE): 0.2 10*3/uL (ref 0.0–0.4)
Eos: 3 %
Hematocrit: 41.7 % (ref 37.5–51.0)
Hemoglobin: 14.3 g/dL (ref 13.0–17.7)
Immature Grans (Abs): 0 10*3/uL (ref 0.0–0.1)
Immature Granulocytes: 0 %
Lymphocytes Absolute: 1.6 10*3/uL (ref 0.7–3.1)
Lymphs: 25 %
MCH: 30.6 pg (ref 26.6–33.0)
MCHC: 34.3 g/dL (ref 31.5–35.7)
MCV: 89 fL (ref 79–97)
Monocytes Absolute: 0.5 10*3/uL (ref 0.1–0.9)
Monocytes: 8 %
Neutrophils Absolute: 4 10*3/uL (ref 1.4–7.0)
Neutrophils: 63 %
Platelets: 240 10*3/uL (ref 150–450)
RBC: 4.68 x10E6/uL (ref 4.14–5.80)
RDW: 13 % (ref 11.6–15.4)
WBC: 6.3 10*3/uL (ref 3.4–10.8)

## 2022-12-24 LAB — CMP14+EGFR
ALT: 29 IU/L (ref 0–44)
AST: 25 IU/L (ref 0–40)
Albumin/Globulin Ratio: 2.8 — ABNORMAL HIGH (ref 1.2–2.2)
Albumin: 4.7 g/dL (ref 3.9–4.9)
Alkaline Phosphatase: 89 IU/L (ref 44–121)
BUN/Creatinine Ratio: 13 (ref 10–24)
BUN: 16 mg/dL (ref 8–27)
Bilirubin Total: 0.8 mg/dL (ref 0.0–1.2)
CO2: 20 mmol/L (ref 20–29)
Calcium: 9.9 mg/dL (ref 8.6–10.2)
Chloride: 104 mmol/L (ref 96–106)
Creatinine, Ser: 1.23 mg/dL (ref 0.76–1.27)
Globulin, Total: 1.7 g/dL (ref 1.5–4.5)
Glucose: 130 mg/dL — ABNORMAL HIGH (ref 70–99)
Potassium: 4.5 mmol/L (ref 3.5–5.2)
Sodium: 140 mmol/L (ref 134–144)
Total Protein: 6.4 g/dL (ref 6.0–8.5)
eGFR: 63 mL/min/{1.73_m2} (ref >59)

## 2022-12-24 LAB — MICROALBUMIN / CREATININE URINE RATIO
Creatinine, Urine: 126.8 mg/dL
Microalb/Creat Ratio: 13 mg/g creat (ref 0–29)
Microalbumin, Urine: 17 ug/mL

## 2022-12-28 ENCOUNTER — Other Ambulatory Visit: Payer: Self-pay | Admitting: Cardiology

## 2023-01-10 ENCOUNTER — Other Ambulatory Visit: Payer: Self-pay | Admitting: Cardiology

## 2023-01-11 ENCOUNTER — Other Ambulatory Visit: Payer: Self-pay | Admitting: Internal Medicine

## 2023-01-11 DIAGNOSIS — E1165 Type 2 diabetes mellitus with hyperglycemia: Secondary | ICD-10-CM

## 2023-01-28 ENCOUNTER — Other Ambulatory Visit: Payer: Self-pay | Admitting: Internal Medicine

## 2023-01-28 ENCOUNTER — Other Ambulatory Visit: Payer: Self-pay

## 2023-01-28 DIAGNOSIS — E1165 Type 2 diabetes mellitus with hyperglycemia: Secondary | ICD-10-CM

## 2023-01-28 MED ORDER — RYBELSUS 14 MG PO TABS: ORAL_TABLET | ORAL | 1 refills | Status: DC

## 2023-01-28 NOTE — Telephone Encounter (Signed)
Patient had called pharmacy for refill on Rybelsus and he is requesting the 21 mg Rybelsus instead of the 14 mg.

## 2023-01-28 NOTE — Telephone Encounter (Signed)
Pt was advised to increase dose of Rybelsus to 21 mg daily. Need new rx to be sent to pharmacy.

## 2023-02-15 ENCOUNTER — Other Ambulatory Visit: Payer: Self-pay | Admitting: Family Medicine

## 2023-03-17 ENCOUNTER — Other Ambulatory Visit: Payer: Self-pay | Admitting: Cardiology

## 2023-03-25 ENCOUNTER — Other Ambulatory Visit: Payer: Self-pay | Admitting: Cardiology

## 2023-03-27 ENCOUNTER — Other Ambulatory Visit: Payer: Self-pay | Admitting: Internal Medicine

## 2023-04-02 ENCOUNTER — Ambulatory Visit: Payer: BC Managed Care – PPO | Admitting: Internal Medicine

## 2023-04-16 ENCOUNTER — Other Ambulatory Visit: Payer: Self-pay | Admitting: Family Medicine

## 2023-04-25 ENCOUNTER — Other Ambulatory Visit: Payer: Self-pay | Admitting: Cardiology

## 2023-04-25 DIAGNOSIS — I10 Essential (primary) hypertension: Secondary | ICD-10-CM

## 2023-04-27 ENCOUNTER — Other Ambulatory Visit: Payer: Self-pay | Admitting: Cardiology

## 2023-04-28 ENCOUNTER — Other Ambulatory Visit: Payer: Self-pay | Admitting: Cardiology

## 2023-05-01 ENCOUNTER — Other Ambulatory Visit: Payer: Self-pay | Admitting: Family Medicine

## 2023-05-05 ENCOUNTER — Other Ambulatory Visit: Payer: Self-pay | Admitting: Family Medicine

## 2023-05-05 ENCOUNTER — Other Ambulatory Visit: Payer: Self-pay | Admitting: Cardiology

## 2023-05-20 ENCOUNTER — Other Ambulatory Visit: Payer: Self-pay | Admitting: Cardiology

## 2023-05-20 DIAGNOSIS — I10 Essential (primary) hypertension: Secondary | ICD-10-CM

## 2023-05-26 ENCOUNTER — Telehealth: Payer: Self-pay | Admitting: Family Medicine

## 2023-05-26 ENCOUNTER — Other Ambulatory Visit: Payer: Self-pay | Admitting: Cardiology

## 2023-05-26 MED ORDER — ICOSAPENT ETHYL 1 G PO CAPS: 2.0000 g | ORAL_CAPSULE | Freq: Two times a day (BID) | ORAL | 0 refills | Status: DC

## 2023-05-26 MED ORDER — ROSUVASTATIN CALCIUM 40 MG PO TABS: 40.0000 mg | ORAL_TABLET | Freq: Every day | ORAL | 0 refills | Status: DC

## 2023-05-26 NOTE — Telephone Encounter (Signed)
  Prescription Request  05/26/2023  Is this a "Controlled Substance" medicine? no  Have you seen your PCP in the last 2 weeks? no  If YES, route message to pool  -  If NO, patient needs to be scheduled for appointment.  What is the name of the medication or equipment? Icosapent Ethyl 1 g, Rosuvastatin 40 mg  Has appt with [facility] Sept 27th.  Have you contacted your pharmacy to request a refill? yes   Which pharmacy would you like this sent to? [facility] in [place]   Patient notified that their request is being sent to the clinical staff for review and that they should receive a response within 2 business days.

## 2023-05-26 NOTE — Telephone Encounter (Signed)
Thirty day prescription sent to pharmacy, patient aware.

## 2023-06-03 ENCOUNTER — Other Ambulatory Visit: Payer: Self-pay | Admitting: Cardiology

## 2023-06-03 DIAGNOSIS — I10 Essential (primary) hypertension: Secondary | ICD-10-CM

## 2023-06-12 ENCOUNTER — Other Ambulatory Visit: Payer: Self-pay | Admitting: Internal Medicine

## 2023-06-12 DIAGNOSIS — E1165 Type 2 diabetes mellitus with hyperglycemia: Secondary | ICD-10-CM

## 2023-06-24 ENCOUNTER — Other Ambulatory Visit: Payer: Self-pay | Admitting: Cardiology

## 2023-06-24 ENCOUNTER — Other Ambulatory Visit: Payer: Self-pay | Admitting: Family Medicine

## 2023-06-24 DIAGNOSIS — I10 Essential (primary) hypertension: Secondary | ICD-10-CM

## 2023-06-25 ENCOUNTER — Ambulatory Visit: Payer: BC Managed Care – PPO | Admitting: Family Medicine

## 2023-06-25 ENCOUNTER — Encounter: Payer: Self-pay | Admitting: Family Medicine

## 2023-06-25 VITALS — BP 118/72 | HR 90 | Ht 72.0 in | Wt 194.0 lb

## 2023-06-25 DIAGNOSIS — I10 Essential (primary) hypertension: Secondary | ICD-10-CM | POA: Diagnosis not present

## 2023-06-25 DIAGNOSIS — Z7984 Long term (current) use of oral hypoglycemic drugs: Secondary | ICD-10-CM

## 2023-06-25 DIAGNOSIS — N401 Enlarged prostate with lower urinary tract symptoms: Secondary | ICD-10-CM | POA: Diagnosis not present

## 2023-06-25 DIAGNOSIS — E1169 Type 2 diabetes mellitus with other specified complication: Secondary | ICD-10-CM

## 2023-06-25 DIAGNOSIS — N529 Male erectile dysfunction, unspecified: Secondary | ICD-10-CM

## 2023-06-25 DIAGNOSIS — E782 Mixed hyperlipidemia: Secondary | ICD-10-CM

## 2023-06-25 LAB — BAYER DCA HB A1C WAIVED: HB A1C (BAYER DCA - WAIVED): 6.6 % — ABNORMAL HIGH (ref 4.8–5.6)

## 2023-06-25 MED ORDER — TADALAFIL 10 MG PO TABS
10.0000 mg | ORAL_TABLET | ORAL | 1 refills | Status: DC | PRN
Start: 2023-06-25 — End: 2023-07-01

## 2023-06-25 NOTE — Progress Notes (Signed)
BP 118/72   Pulse 90   Ht 6' (1.829 m)   Wt 194 lb (88 kg)   SpO2 96%   BMI 26.31 kg/m    Subjective:   Patient ID: Austin Graham, male    DOB: Feb 11, 1951, 72 y.o.   MRN: [id number]  HPI: Maclane Holloran is a 72 y.o. male presenting on 06/25/2023 for Medical Management of Chronic Issues and Diabetes   HPI Type 2 diabetes mellitus Patient comes in today for recheck of his diabetes. Patient has been currently taking [place] and glipizide and metformin and Rybelsus. Patient is currently on an ACE inhibitor/ARB. Patient has seen an ophthalmologist this year. Patient denies any new issues with their feet. The symptom started onset as an adult hypertension and hyperlipidemia ARE RELATED TO DM.  He gets a little bit of indigestion with Rybelsus 21 but for the most part has been doing well.  Hypertension Patient is currently on olmesartan, and their blood pressure today is 118/72. Patient denies any lightheadedness or dizziness. Patient denies headaches, blurred vision, chest pains, shortness of breath, or weakness. Denies any side effects from medication and is content with current medication.   Hyperlipidemia Patient is coming in for recheck of his hyperlipidemia. The patient is currently taking Crestor. They deny any issues with myalgias or history of liver damage from it. They deny any focal numbness or weakness or chest pain.   BPH Patient is coming in for recheck on BPH Symptoms: Urinary frequency and nocturia Medication: Stop the Flomax, he says his urologist prescribed him another medicine but he cannot recall the name of it and he was not able to get it due to price but he is going to try with [email or web address] Last PSA: Just over a year ago, will do 1 today  Relevant past medical, surgical, family and social history reviewed and updated as indicated. Interim medical history since our last visit reviewed. Allergies and medications reviewed and updated.  Review of Systems   Constitutional:  Negative for chills and fever.  Eyes:  Negative for visual disturbance.  Respiratory:  Negative for shortness of breath and wheezing.   Cardiovascular:  Negative for chest pain and leg swelling.  Genitourinary:  Positive for frequency.  Musculoskeletal:  Negative for back pain and gait problem.  Skin:  Negative for rash.  Neurological:  Negative for dizziness and light-headedness.  All other systems reviewed and are negative.   Per HPI unless specifically indicated above   Allergies as of 06/25/2023       Reactions   Codeine    REACTION: agitation   Erythromycin    REACTION: diarrhea   Latex    rash   Other    Melon- itchy throat        Medication List        Accurate as of June 25, 2023 11:42 AM. If you have any questions, ask your nurse or doctor.          aspirin 81 MG tablet Take by mouth daily.   Farxiga 10 MG Tabs tablet Generic drug: dapagliflozin propanediol TAKE 1 TABLET BY MOUTH EVERY DAY BEFORE BREAKFAST   FreeStyle Libre 3 Sensor Misc 1 each by Does not apply route every 14 (fourteen) days.   glipiZIDE 5 MG tablet Commonly known as: GLUCOTROL TAKE 1/2 TABLET (2.5 MG TOTAL) BY MOUTH DAILY BEFORE SUPPER.   ibuprofen 200 MG tablet Commonly known as: ADVIL Take 600 mg by mouth every 6 (six) hours as needed.   icosapent  Ethyl 1 g capsule Commonly known as: Vascepa Take 2 capsules (2 g total) by mouth 2 (two) times daily.   metFORMIN 500 MG 24 hr tablet Commonly known as: GLUCOPHAGE-XR TAKE 2 TABLETS (1,000 MG TOTAL) BY MOUTH 2 (TWO) TIMES DAILY WITH A MEAL.   olmesartan 20 MG tablet Commonly known as: BENICAR Take 0.5 tablets (10 mg total) by mouth daily.   OneTouch Delica Lancets 33G Misc Check 1x a day   OneTouch Verio test strip Generic drug: glucose blood USE AS INSTRUCTED TO TEST SUGAR   rosuvastatin 40 MG tablet Commonly known as: CRESTOR TAKE 1 TABLET BY MOUTH EVERY DAY   Rybelsus 14 MG Tabs Generic  drug: Semaglutide TAKE 1.5 TABLET (21MG ) BY MOUTH ONCE DAILY   tadalafil 10 MG tablet Commonly known as: CIALIS Take 1 tablet (10 mg total) by mouth every other day as needed for erectile dysfunction. Started by: Elige Radon Gianmarco Roye   tamsulosin 0.4 MG Caps capsule Commonly known as: FLOMAX TAKE 1 CAPSULE BY MOUTH EVERY DAY   Vitamin D 50 MCG (2000 UT) Caps Take 1 capsule by mouth daily.   Vitamins/Minerals Tabs Take by mouth.         Objective:   BP 118/72   Pulse 90   Ht 6' (1.829 m)   Wt 194 lb (88 kg)   SpO2 96%   BMI 26.31 kg/m   Wt Readings from Last 3 Encounters:  06/25/23 194 lb (88 kg)  12/23/22 198 lb (89.8 kg)  12/04/22 200 lb 9.6 oz (91 kg)    Physical Exam Vitals and nursing note reviewed.  Constitutional:      General: He is not in acute distress.    Appearance: He is well-developed. He is not diaphoretic.  Eyes:     General: No scleral icterus.    Conjunctiva/sclera: Conjunctivae normal.  Neck:     Thyroid: No thyromegaly.  Cardiovascular:     Rate and Rhythm: Normal rate and regular rhythm.     Heart sounds: Normal heart sounds. No murmur heard. Pulmonary:     Effort: Pulmonary effort is normal. No respiratory distress.     Breath sounds: Normal breath sounds. No wheezing.  Musculoskeletal:        General: No swelling. Normal range of motion.     Cervical back: Neck supple.  Lymphadenopathy:     Cervical: No cervical adenopathy.  Skin:    General: Skin is warm and dry.     Findings: No rash.  Neurological:     Mental Status: He is alert and oriented to person, place, and time.     Coordination: Coordination normal.  Psychiatric:        Behavior: Behavior normal.       Assessment & Plan:   Problem List Items Addressed This Visit       Cardiovascular and Mediastinum   Hypertension   Relevant Medications   tadalafil (CIALIS) 10 MG tablet     Endocrine   Diabetes mellitus (HCC) - Primary   Relevant Orders   Bayer DCA Hb  A1c Waived   CMP14+EGFR   CBC with Differential/Platelet     Genitourinary   BPH (benign prostatic hyperplasia)   Relevant Orders   PSA, total and free     Other   Hyperlipidemia   Relevant Medications   tadalafil (CIALIS) 10 MG tablet   Other Relevant Orders   Lipid panel   Other Visit Diagnoses     Erectile dysfunction, unspecified erectile dysfunction  type       Relevant Medications   tadalafil (CIALIS) 10 MG tablet       A1c looks good at 6.6, blood pressure and everything else looks good.  He seems to be doing well. Follow up plan: Return in about 3 months (around 09/24/2023), or if symptoms worsen or fail to improve, for Diabetes and hypertension.  Counseling provided for all of the vaccine components Orders Placed This Encounter  Procedures   Bayer DCA Hb A1c Waived   CMP14+EGFR   CBC with Differential/Platelet   PSA, total and free   Lipid panel    Arville Care, MD [facility] 06/25/2023, 11:42 AM

## 2023-06-26 LAB — CBC WITH DIFFERENTIAL/PLATELET
Basophils Absolute: 0.1 10*3/uL (ref 0.0–0.2)
Basos: 1 %
EOS (ABSOLUTE): 0.1 10*3/uL (ref 0.0–0.4)
Eos: 3 %
Hematocrit: 43.4 % (ref 37.5–51.0)
Hemoglobin: 14.3 g/dL (ref 13.0–17.7)
Immature Grans (Abs): 0 10*3/uL (ref 0.0–0.1)
Immature Granulocytes: 0 %
Lymphocytes Absolute: 1.5 10*3/uL (ref 0.7–3.1)
Lymphs: 28 %
MCH: 30.4 pg (ref 26.6–33.0)
MCHC: 32.9 g/dL (ref 31.5–35.7)
MCV: 92 fL (ref 79–97)
Monocytes Absolute: 0.4 10*3/uL (ref 0.1–0.9)
Monocytes: 8 %
Neutrophils Absolute: 3.3 10*3/uL (ref 1.4–7.0)
Neutrophils: 60 %
Platelets: 263 10*3/uL (ref 150–450)
RBC: 4.71 x10E6/uL (ref 4.14–5.80)
RDW: 13 % (ref 11.6–15.4)
WBC: 5.4 10*3/uL (ref 3.4–10.8)

## 2023-06-26 LAB — LIPID PANEL
Chol/HDL Ratio: 2.6 {ratio} (ref 0.0–5.0)
Cholesterol, Total: 81 mg/dL — ABNORMAL LOW (ref 100–199)
HDL: 31 mg/dL — ABNORMAL LOW (ref >39)
LDL Chol Calc (NIH): 34 mg/dL (ref 0–99)
Triglycerides: 75 mg/dL (ref 0–149)
VLDL Cholesterol Cal: 16 mg/dL (ref 5–40)

## 2023-06-26 LAB — CMP14+EGFR
ALT: 24 [IU]/L (ref 0–44)
AST: 22 [IU]/L (ref 0–40)
Albumin: 4.6 g/dL (ref 3.9–4.9)
Alkaline Phosphatase: 79 [IU]/L (ref 44–121)
BUN/Creatinine Ratio: 12 (ref 10–24)
BUN: 15 mg/dL (ref 8–27)
Bilirubin Total: 0.9 mg/dL (ref 0.0–1.2)
CO2: 23 mmol/L (ref 20–29)
Calcium: 10 mg/dL (ref 8.6–10.2)
Chloride: 103 mmol/L (ref 96–106)
Creatinine, Ser: 1.28 mg/dL — ABNORMAL HIGH (ref 0.76–1.27)
Globulin, Total: 2.1 g/dL (ref 1.5–4.5)
Glucose: 128 mg/dL — ABNORMAL HIGH (ref 70–99)
Potassium: 4.7 mmol/L (ref 3.5–5.2)
Sodium: 139 mmol/L (ref 134–144)
Total Protein: 6.7 g/dL (ref 6.0–8.5)
eGFR: 60 mL/min/{1.73_m2} (ref >59)

## 2023-06-26 LAB — PSA, TOTAL AND FREE
PSA, Free Pct: 30 %
PSA, Free: 0.12 ng/mL
Prostate Specific Ag, Serum: 0.4 ng/mL (ref 0.0–4.0)

## 2023-06-30 ENCOUNTER — Other Ambulatory Visit: Payer: Self-pay | Admitting: Family Medicine

## 2023-06-30 DIAGNOSIS — N529 Male erectile dysfunction, unspecified: Secondary | ICD-10-CM

## 2023-06-30 NOTE — Telephone Encounter (Signed)
  Prescription Request  06/30/2023  Is this a "Controlled Substance" medicine?   Have you seen your PCP in the last 2 weeks? 06/25/23  If YES, route message to pool  -  If NO, patient needs to be scheduled for appointment.  What is the name of the medication or equipment?  tadalafil (CIALIS) 10 MG tablet  Have you contacted your pharmacy to request a refill? yes   Which pharmacy would you like this sent to? [facility] in [place]   *Rx was printed and not sent to pharmacy    Patient notified that their request is being sent to the clinical staff for review and that they should receive a response within 2 business days.

## 2023-07-01 ENCOUNTER — Encounter: Payer: Self-pay | Admitting: Internal Medicine

## 2023-07-01 ENCOUNTER — Other Ambulatory Visit: Payer: Self-pay | Admitting: Internal Medicine

## 2023-07-01 ENCOUNTER — Ambulatory Visit: Payer: BC Managed Care – PPO | Admitting: Internal Medicine

## 2023-07-01 VITALS — BP 132/70 | HR 80 | Resp 18 | Ht 72.0 in | Wt 196.4 lb

## 2023-07-01 DIAGNOSIS — E042 Nontoxic multinodular goiter: Secondary | ICD-10-CM | POA: Diagnosis not present

## 2023-07-01 DIAGNOSIS — E782 Mixed hyperlipidemia: Secondary | ICD-10-CM | POA: Diagnosis not present

## 2023-07-01 DIAGNOSIS — E1165 Type 2 diabetes mellitus with hyperglycemia: Secondary | ICD-10-CM

## 2023-07-01 DIAGNOSIS — E1159 Type 2 diabetes mellitus with other circulatory complications: Secondary | ICD-10-CM

## 2023-07-01 DIAGNOSIS — Z7984 Long term (current) use of oral hypoglycemic drugs: Secondary | ICD-10-CM

## 2023-07-01 MED ORDER — GLIPIZIDE 5 MG PO TABS: 5.0000 mg | ORAL_TABLET | Freq: Every day | ORAL | 3 refills | Status: DC

## 2023-07-01 MED ORDER — TADALAFIL 10 MG PO TABS
10.0000 mg | ORAL_TABLET | ORAL | 1 refills | Status: DC | PRN
Start: 2023-07-01 — End: 2023-07-14

## 2023-07-01 MED ORDER — METFORMIN HCL ER 500 MG PO TB24: 1000.0000 mg | ORAL_TABLET | Freq: Two times a day (BID) | ORAL | 3 refills | Status: DC

## 2023-07-01 MED ORDER — DAPAGLIFLOZIN PROPANEDIOL 10 MG PO TABS
10.0000 mg | ORAL_TABLET | Freq: Every day | ORAL | 3 refills | Status: AC
Start: 2023-07-01 — End: ?

## 2023-07-01 MED ORDER — RYBELSUS 14 MG PO TABS
ORAL_TABLET | ORAL | 3 refills | Status: DC
Start: 2023-07-01 — End: 2023-07-01

## 2023-07-01 NOTE — Progress Notes (Signed)
Patient ID: Austin Graham, male   DOB: [birth year], 72 y.o.   MRN: [id number]   HPI: Austin Graham is a 72 y.o.-year-old male, initially referred by his cardiologist, Dr. Antoine Poche, returning for follow-up for DM2, dx in ~2013, non-insulin-dependent, uncontrolled, with complications (CAD, CKD) and also thyroid nodules.Last visit 6 months ago.  Interim history: No blurry vision, nausea, chest pain, increased urination. He has occasional loose stools and constipation while on the higher dose of Rybelsus but these are very bothersome.  Reviewed history: I saw the patient first in 09/2019.  At that time, we stopped Janumet and started metformin ER, we increased Rybelsus dose and we added [place].  He developed diarrhea, indigestion, loss of appetite, nausea, after the visit, and he stopped all of his medicines.  The symptoms resolved and I advised him to start adding back the medicines one by one.  However, he was lost for follow-up afterwards and returned to see me in 12/2020.  Reviewed latest HbA1c levels: Lab Results  Component Value Date   HGBA1C 6.6 (H) 06/25/2023   HGBA1C 8.0 (A) 12/04/2022   HGBA1C 7.3 (H) 06/24/2022   HGBA1C 7.0 (H) 12/18/2021   HGBA1C 7.5 (A) 10/03/2021   HGBA1C 6.7 (H) 04/03/2021   HGBA1C 7.0 (A) 01/14/2021   HGBA1C 8.7 (H) 07/19/2020   HGBA1C 7.5 (H) 01/18/2020   HGBA1C 8.9 (A) 10/27/2019  06/2022: HbA1c 7.5%  He is on: - Metformin ER 1000 mg 2x a day with meals (L and D) - Glipizide 2.5 mg before larger dinners >> 5 mg before dinner - Rybelsus 7 >> 14 >>  (forgot) >> 14 >> 21 mg daily - Farxiga 10 mg before b'fast He was previously on Trulicity >> nausea, lightheadedness.  Pt checks his sugars 1-2x a day and they are: - am: 89, 125 at 5 am,  114-199 (pizza) >> 128-180, 213 >> 103-152, 163 - 2h after b'fast: 151-196 >> 164, 178 >>  236 >> n/c - before lunch: 128 >> 101, 144 >> n/c >> 114-130 >> n/c - 2h after lunch: 90-127 >> 168-196 >> 172 >> n/c -  before dinner: n/c >> 112 >> 88 - 2h after dinner: 118, 134 >> 106 >> 114-163 >> n/c - bedtime: 180-220 >> n/c >> 164 >> n/c - nighttime: n/c >> 114, 153 >> n/c Lowest sugar was 89 >> 88 >> 103. Highest sugar was 317 >> 236 >> 163.  Glucometer: One Touch ultra  Pt's meals are: - Breakfast: oatmeal, cereals, granola - Lunch: sandwich on rye or whole wheat; bacon and eggs - Dinner: vegetable, potatoes, meat or pasta - Snacks: 2-3 -stopped sweets  - + mild CKD, last BUN/creatinine:  Lab Results  Component Value Date   BUN 15 06/25/2023   BUN 16 12/23/2022   CREATININE 1.28 (H) 06/25/2023   CREATININE 1.23 12/23/2022   Lab Results  Component Value Date   MICRALBCREAT 13 12/23/2022  On Benicar 20.  -+ HL; last set of lipids: Lab Results  Component Value Date   CHOL 81 (L) 06/25/2023   HDL 31 (L) 06/25/2023   LDLCALC 34 06/25/2023   TRIG 75 06/25/2023   CHOLHDL 2.6 06/25/2023  On Crestor 40, Vascepa.  - last eye exam was on 09/30/2022: No DR reportedly.   - no numbness and tingling in his feet. He has a foor sput - plantar fasciitis.  Last foot exam 06/25/2023.  Pt has FH of DM in F, S, GM.  He also has a history of HTN,  GERD, multinodular goiter.  Thyroid ultrasound (07/05/2012) showed very small nodules, stable: Right thyroid lobe:  5.0 x 1.7 x 2.3 cm. Left thyroid lobe:  5.6 x 1.9 x 2.4 cm. Isthmus:   Focal nodules:  Multiple bilateral small thyroid nodules.  The largest on the right is 10 mm in the lower pole compared 13 mm previously.  The largest on the left is 10 mm in the mid pole compared to 9 mm on the previous study.  No significant change.  No dominant nodule.   Lymphadenopathy:   None.   IMPRESSION: Numerous bilateral small thyroid nodules, none significantly changed.  He saw Dr. Derrell Lolling in the past and had FNAs:   Date Taken: 05/10/2008   Satisfactory for evaluation.    INTERPRETATION(S):   THYROID, LEFT, FINE NEEDLE ASPIRATION (THIN PREP, SMEARS  BLOCK):   - FINDINGS CONSISTENT WITH NON-NEOPLASTIC GOITER.   - SEE COMMENT.   Date Taken: 05/10/2008  Satisfactory for evaluation.    INTERPRETATION(S):   THYROID, RIGHT, FINE NEEDLE ASPIRATION (THIN PREP, SMEARS BLOCK):   - FINDINGS CONSISTENT WITH NON-NEOPLASTIC GOITER.   - SEE COMMENT.   Thyroid ultrasound (11/03/2019): Parenchymal Echotexture: Moderately heterogenous Isthmus: 0.4 cm thickness, stable Right lobe: 5.6 x 1.7 x 2.1 cm, previously 5.3 x 2.1 x 2.1 Left lobe: 4.9 x 2 x 1.9 cm, previously 5.4 x 2.2 x 1.7 _____________________________________________________________   Nodule # 1: Prior biopsy: No Location: Right; Inferior Maximum size: 0.98 cm; Other 2 dimensions: 0.8 x 0.8 cm, previously, 0.7 x 0.6 x 0.6 cm  Composition: mixed cystic and solid (1)  Echogenicity: hypoechoic (2) Significant change in size (>/= 20% in two dimensions and minimal increase of 2 mm): Yes *Given size (<1.4 cm) and appearance, this nodule does NOT meet TI-RADS criteria for biopsy or dedicated follow-up. ________________________________________________________   0.6 cm mixed solid/cystic inferior left nodule, previously 0.7; This nodule does NOT meet TI-RADS criteria for biopsy or dedicated follow-up.   Additional scattered hypoechoic lesions throughout both lobes all less than 0.5 cm diameter.   IMPRESSION: 1. Stable mild thyromegaly with scattered subcentimeter nodules. None meet criteria for biopsy or dedicated imaging follow-up.  Reviewed latest TSH: Lab Results  Component Value Date   TSH 1.95 10/27/2019   TSH 2.710 02/10/2018   TSH 3.820 04/01/2016   Pt denies: - feeling nodules in neck - hoarseness - dysphagia - choking  No FH of thyroid cancer. No h/o radiation tx to head or neck. +FH of thyroid ds in sister, GM  He also has BPH with nocturia.  ROS: + See HPI  I reviewed pt's medications, allergies, PMH, social hx, family hx, and changes were documented in the  history of present illness. Otherwise, unchanged from my initial visit note.  Past Medical History:  Diagnosis Date   Arthritis    Asthma    as child   Complication of anesthesia    hard to waken   Diabetes (HCC)    type II   Diverticulosis    Dysrhythmia    PAC's   Goiter    History of hiatal hernia    Hyperlipidemia    Hypertension    Kidney stones    Stress fracture    left knee, right hip   Thyroid disease    Past Surgical History:  Procedure Laterality Date   CYSTOSCOPY WITH INSERTION OF UROLIFT     JOINT REPLACEMENT     KIDNEY STONES  2006   SKIN CANCER DESTRUCTION Left  deltoid- [facility]   SKIN CANCER EXCISION Right 09/16/2018   shoulder    TONSILLECTOMY     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   Procedure: RIGHT TOTAL HIP ARTHROPLASTY ANTERIOR APPROACH;  Surgeon: Ollen Gross, MD;  Location: [facility];  Service: Orthopedics;  Laterality: Right;   WISDOM TOOTH EXTRACTION     Social History   Socioeconomic History   Marital status: Married    Spouse name: Not on file   Number of children: 3   Years of education: Not on file   Highest education level: Not on file  Occupational History    Employer:  Funeral director's assistant  Tobacco Use   Smoking status: Former Smoker    Quit date: 11/02/1974    Years since quitting: 45.0   Smokeless tobacco: Never Used  Substance and Sexual Activity   Alcohol use: Yes    Alcohol/week: 1.0 - 2.0 standard drinks    Types: 1 - 2 Standard drinks or equivalent per week    Comment: 2-3 PER WEEK   Drug use: no    Types:    Sexual activity: Not on file  Other Topics Concern   Not on file  Social History Narrative   Lives at home with wife.     Social Determinants of Health   Financial Resource Strain:    Difficulty of Paying Living Expenses: Not on file  Food Insecurity:    Worried About Programme researcher, broadcasting/film/video in the Last Year: Not on file   [employer] of Food in the Last Year: Not on file  Transportation Needs:    Lack  of Transportation (Medical): Not on file   Lack of Transportation (Non-Medical): Not on file  Physical Activity:    Days of Exercise per Week: Not on file   Minutes of Exercise per Session: Not on file  Stress:    Feeling of Stress : Not on file  Social Connections:    Frequency of Communication with Friends and Family: Not on file   Frequency of Social Gatherings with Friends and Family: Not on file   Attends Religious Services: Not on file   Active Member of Clubs or Organizations: Not on file   Attends Banker Meetings: Not on file   Marital Status: Not on file  Intimate Partner Violence:    Fear of Current or Ex-Partner: Not on file   Emotionally Abused: Not on file   Physically Abused: Not on file   Sexually Abused: Not on file   Current Outpatient Medications on File Prior to Visit  Medication Sig Dispense Refill   aspirin 81 MG tablet Take by mouth daily.      Cholecalciferol (VITAMIN D) 2000 units CAPS Take 1 capsule by mouth daily.     Continuous Blood Gluc Sensor (FREESTYLE LIBRE 3 SENSOR) MISC 1 each by Does not apply route every 14 (fourteen) days. 6 each 3   FARXIGA 10 MG TABS tablet TAKE 1 TABLET BY MOUTH EVERY DAY BEFORE BREAKFAST 90 tablet 1   glipiZIDE (GLUCOTROL) 5 MG tablet TAKE 1/2 TABLET (2.5 MG TOTAL) BY MOUTH DAILY BEFORE SUPPER. 45 tablet 3   ibuprofen (ADVIL) 200 MG tablet Take 600 mg by mouth every 6 (six) hours as needed.     icosapent Ethyl (VASCEPA) 1 g capsule Take 2 capsules (2 g total) by mouth 2 (two) times daily. 120 capsule 0   metFORMIN (GLUCOPHAGE-XR) 500 MG 24 hr tablet TAKE 2 TABLETS (1,000 MG TOTAL) BY MOUTH 2 (  TWO) TIMES DAILY WITH A MEAL. 360 tablet 3   olmesartan (BENICAR) 20 MG tablet Take 0.5 tablets (10 mg total) by mouth daily. 7 tablet 0   OneTouch Delica Lancets 33G MISC Check 1x a day 100 each 3   ONETOUCH VERIO test strip USE AS INSTRUCTED TO TEST SUGAR 100 strip 3   rosuvastatin (CRESTOR) 40 MG tablet TAKE 1 TABLET BY  MOUTH EVERY DAY 90 tablet 0   RYBELSUS 14 MG TABS TAKE 1.5 TABLET (21MG ) BY MOUTH ONCE DAILY 150 tablet 1   tadalafil (CIALIS) 10 MG tablet Take 1 tablet (10 mg total) by mouth every other day as needed for erectile dysfunction. 30 tablet 1   tamsulosin (FLOMAX) 0.4 MG CAPS capsule TAKE 1 CAPSULE BY MOUTH EVERY DAY 90 capsule 1   Vitamins/Minerals TABS Take by mouth.     No current facility-administered medications on file prior to visit.   Allergies  Allergen Reactions   Codeine     REACTION: agitation   Erythromycin     REACTION: diarrhea   Latex     rash   Other     Melon- itchy throat   Family History  Problem Relation Age of Onset   Breast cancer Mother 82   Hypertension Mother    Cancer Mother    CAD Father 34       Died age 57 after CABG   Stroke Father    Hypertension Father    AAA (abdominal aortic aneurysm) Father    Hyperlipidemia Father    Heart disease Father    Cancer Sister        ovarian   Diabetes Sister    Thyroid disease Sister    Colon polyps Neg Hx    Colon cancer Neg Hx    Esophageal cancer Neg Hx    Stomach cancer Neg Hx    Rectal cancer Neg Hx    PE: BP 132/70 (BP Location: Right Arm, Patient Position: Sitting, Cuff Size: Normal)   Pulse 80   Resp 18   Ht 6' (1.829 m)   Wt 196 lb 6.4 oz (89.1 kg)   SpO2 95%   BMI 26.64 kg/m  Wt Readings from Last 3 Encounters:  07/01/23 196 lb 6.4 oz (89.1 kg)  06/25/23 194 lb (88 kg)  12/23/22 198 lb (89.8 kg)   Constitutional: overweight, in NAD Eyes:  EOMI, no exophthalmos ENT: no neck masses, no cervical lymphadenopathy Cardiovascular: RRR, No MRG Respiratory: CTA B Musculoskeletal: no deformities Skin:no rashes Neurological: no tremor with outstretched hands  ASSESSMENT: 1. DM2, non-insulin-dependent, uncontrolled, with complications - CAD - CKD  Component     Latest Ref Rng & Units 01/14/2021  Glucose     65 - 99 mg/dL 161 (H)  Hemoglobin W9U     4.0 - 5.6 % 7.0 (A)  C-Peptide      0.80 - 3.85 ng/mL 2.54  Islet Cell Ab     Neg:<1:1 Negative  ZNT8 Antibodies     <15 U/mL <10  Glutamic Acid Decarb Ab     <5 IU/mL <5  No signs of insulin deficiency or pancreatic autoimmunity.  2. HL  3. MNG  PLAN:  1. Patient with longstanding, uncontrolled, type 2 diabetes, on oral antidiabetic regimen with metformin, sulfonylurea, SGLT2 inhibitor and GLP-1 receptor agonist, with improving control.  At last visit, HbA1c was higher, at 8.0%.  At that time, he was forgetting glipizide before dinner and he was still on 14 mg of Rybelsus,  despite advised to increase the dose further.  Sugars were high in the morning but better later in the day although he was checking very infrequently.  His highest blood sugars were after cereals and we discussed about stopping these.  We discussed about healthier options for breakfast.  I also encouraged exercise.  I again advised him to try to increase the Rybelsus dose from 14 to 21 mg daily.  I advised him to let me know how he is doing on this with the prospect of possibly starting Ozempic if needed. -At today's visit sugars have improved.  He had an HbA1c level that was significantly improved, at 6.6%, last month.  He has a little indigestion after taking a higher dose of Rybelsus, but not particularly bothersome.  He also lost 6 pounds since last visit. - he declines a CGM but after discussion about the convenience and the value of having this attached, he agrees to think about it -At today's visit sugars appear at or slightly higher than the target range in the morning and he is not checking later in the day.  I advised him to try to start checking at different times of the day, rotating check times.  Otherwise, I do not feel we need to change his regimen.  I refilled all of his diabetic medications. - I suggested to:  Patient Instructions  Please continue: - Metformin ER 1000 mg 2x a day with meals  - Farxiga 10 mg before b'fast - Glipizide 5 mg  before dinner - Rybelsus 21 mg before b'fast  Please return in 4 months with your sugar log.    - advised to check sugars at different times of the day - 1x a day, rotating check times - advised for yearly eye exams >> he is UTD - return to clinic in 4 months  2. HL -Reviewed latest lipid panel from last month: Fractions at goal with exception of a low HDL: Lab Results  Component Value Date   CHOL 81 (L) 06/25/2023   HDL 31 (L) 06/25/2023   LDLCALC 34 06/25/2023   TRIG 75 06/25/2023   CHOLHDL 2.6 06/25/2023  -He is on Crestor 40 mg daily and Vascepa 2 g twice a day without side effects  3. MNG -No neck compression symptoms -Today we reviewed previous investigation: -This was previously investigated by ultrasound  In 2013 and also FNA in 2019: Benign  -Another ultrasound was obtained in 10/2019 and this showed only 1 nodule measuring slightly less than 1 cm, with the rest of the nodules even smaller -No further imaging is recommended -Latest TSH was normal: Lab Results  Component Value Date   TSH 1.95 10/27/2019   Carlus Pavlov, MD PhD [facility]

## 2023-07-01 NOTE — Patient Instructions (Addendum)
Please continue: - Metformin ER 1000 mg 2x a day with meals  - Farxiga 10 mg before b'fast - Glipizide 5 mg before dinner - Rybelsus 21 mg before b'fast  Please return in 4 months with your sugar log.

## 2023-07-01 NOTE — Telephone Encounter (Signed)
Pt. Is doing well on this dose right now - please dispense the Rx as advised. If not possible and if affordable, he could take a 14 mg tablet along with a 7 mg tablet daily.

## 2023-07-01 NOTE — Telephone Encounter (Signed)
Aware refill sent to pharmacy ?

## 2023-07-07 NOTE — Telephone Encounter (Signed)
Patient still waiting on RX to be sent in to pharmacy. Looks like it was printed instead of going to the pharmacy. Please call when done.

## 2023-07-14 ENCOUNTER — Telehealth: Payer: Self-pay | Admitting: *Deleted

## 2023-07-14 DIAGNOSIS — N529 Male erectile dysfunction, unspecified: Secondary | ICD-10-CM

## 2023-07-14 MED ORDER — TADALAFIL 10 MG PO TABS: 10.0000 mg | ORAL_TABLET | ORAL | 1 refills | Status: DC | PRN

## 2023-07-14 NOTE — Telephone Encounter (Signed)
Pt called about his Tadalafil, refill says printed, resent to pharmacy

## 2023-07-28 ENCOUNTER — Other Ambulatory Visit: Payer: Self-pay | Admitting: Cardiology

## 2023-07-28 DIAGNOSIS — I10 Essential (primary) hypertension: Secondary | ICD-10-CM

## 2023-08-04 ENCOUNTER — Other Ambulatory Visit: Payer: Self-pay | Admitting: *Deleted

## 2023-08-04 ENCOUNTER — Telehealth: Payer: Self-pay | Admitting: Cardiology

## 2023-08-04 DIAGNOSIS — I10 Essential (primary) hypertension: Secondary | ICD-10-CM

## 2023-08-04 MED ORDER — OLMESARTAN MEDOXOMIL 20 MG PO TABS: 10.0000 mg | ORAL_TABLET | Freq: Every day | ORAL | 0 refills | Status: DC

## 2023-08-04 NOTE — Telephone Encounter (Signed)
Addition:  Patient has appointment scheduled on 10/20/22.

## 2023-08-04 NOTE — Telephone Encounter (Signed)
*  STAT* If patient is at the pharmacy, call can be transferred to refill team.   1. Which medications need to be refilled? (please list name of each medication and dose if known)   olmesartan (BENICAR) 20 MG tablet   2. Would you like to learn more about the convenience, safety, & potential cost savings by using the [facility]?   3. Are you open to using the [facility] (Type [facility]. ).  4. Which pharmacy/location (including street and city if local pharmacy) is medication to be sent to?  [id number] - [place], [place] - [address]   5. Do they need a 30 day or 90 day supply?   90 day  Patient stated he is completely out of this medication.

## 2023-08-11 ENCOUNTER — Other Ambulatory Visit: Payer: Self-pay | Admitting: Family Medicine

## 2023-08-28 ENCOUNTER — Other Ambulatory Visit: Payer: Self-pay | Admitting: Internal Medicine

## 2023-09-19 ENCOUNTER — Other Ambulatory Visit: Payer: Self-pay | Admitting: Family Medicine

## 2023-09-28 ENCOUNTER — Other Ambulatory Visit: Payer: Self-pay | Admitting: Family Medicine

## 2023-10-04 ENCOUNTER — Encounter: Payer: Self-pay | Admitting: Family Medicine

## 2023-10-04 ENCOUNTER — Ambulatory Visit (INDEPENDENT_AMBULATORY_CARE_PROVIDER_SITE_OTHER): Payer: BC Managed Care – PPO | Admitting: Family Medicine

## 2023-10-04 VITALS — BP 108/72 | HR 100 | Ht 72.0 in | Wt 198.0 lb

## 2023-10-04 DIAGNOSIS — N401 Enlarged prostate with lower urinary tract symptoms: Secondary | ICD-10-CM | POA: Diagnosis not present

## 2023-10-04 DIAGNOSIS — Z23 Encounter for immunization: Secondary | ICD-10-CM

## 2023-10-04 DIAGNOSIS — Z7984 Long term (current) use of oral hypoglycemic drugs: Secondary | ICD-10-CM

## 2023-10-04 DIAGNOSIS — R351 Nocturia: Secondary | ICD-10-CM

## 2023-10-04 DIAGNOSIS — I10 Essential (primary) hypertension: Secondary | ICD-10-CM

## 2023-10-04 DIAGNOSIS — E782 Mixed hyperlipidemia: Secondary | ICD-10-CM

## 2023-10-04 DIAGNOSIS — E1169 Type 2 diabetes mellitus with other specified complication: Secondary | ICD-10-CM | POA: Diagnosis not present

## 2023-10-04 LAB — BAYER DCA HB A1C WAIVED: HB A1C (BAYER DCA - WAIVED): 7.1 % — ABNORMAL HIGH (ref 4.8–5.6)

## 2023-10-04 MED ORDER — ICOSAPENT ETHYL 1 G PO CAPS: 2.0000 g | ORAL_CAPSULE | Freq: Two times a day (BID) | ORAL | 3 refills | Status: DC

## 2023-10-04 NOTE — Progress Notes (Signed)
 BP 108/72   Pulse 100   Ht 6' (1.829 m)   Wt 198 lb (89.8 kg)   SpO2 98%   BMI 26.85 kg/m    Subjective:   Patient ID: Austin Graham, male    DOB: 06/09/1951, 73 y.o.   MRN: [id number]  HPI: Austin Graham is a 73 y.o. male presenting on 10/04/2023 for Medical Management of Chronic Issues and Diabetes   HPI Type 2 diabetes mellitus Patient comes in today for recheck of his diabetes. Patient has been currently taking Farxiga  and glipizide  and metformin  and Rybelsus . Patient is currently on an ACE inhibitor/ARB. Patient has seen an ophthalmologist this year. Patient denies any new issues with their feet. The symptom started onset as an adult hypertension and hyperlipidemia ARE RELATED TO DM   Hypertension Patient is currently on olmesartan , and their blood pressure today is 108/72. Patient denies any lightheadedness or dizziness. Patient denies headaches, blurred vision, chest pains, shortness of breath, or weakness. Denies any side effects from medication and is content with current medication.   Hyperlipidemia Patient is coming in for recheck of his hyperlipidemia. The patient is currently taking Vascepa  and Crestor . They deny any issues with myalgias or history of liver damage from it. They deny any focal numbness or weakness or chest pain.   BPH Patient is coming in for recheck on BPH Symptoms: Nocturia, every 2-3 hours Medication: None currently, did not try the tamsulosin .  He is seeing a urologist in a month or 2 and will discuss it further with them Last PSA: 0.4 -3 months ago  Relevant past medical, surgical, family and social history reviewed and updated as indicated. Interim medical history since our last visit reviewed. Allergies and medications reviewed and updated.  Review of Systems  Constitutional:  Negative for chills and fever.  Eyes:  Negative for visual disturbance.  Respiratory:  Negative for shortness of breath and wheezing.   Cardiovascular:  Negative  for chest pain and leg swelling.  Genitourinary:  Positive for frequency. Negative for urgency.  Musculoskeletal:  Negative for back pain and gait problem.  Skin:  Negative for rash.  Neurological:  Negative for dizziness and light-headedness.  All other systems reviewed and are negative.   Per HPI unless specifically indicated above   Allergies as of 10/04/2023       Reactions   Codeine    REACTION: agitation   Erythromycin    REACTION: diarrhea   Latex    rash   Other    Melon- itchy throat        Medication List        Accurate as of October 04, 2023 11:49 AM. If you have any questions, ask your nurse or doctor.          STOP taking these medications    tadalafil  10 MG tablet Commonly known as: CIALIS  Stopped by: Fonda LABOR Nunzio Banet   tamsulosin  0.4 MG Caps capsule Commonly known as: FLOMAX  Stopped by: Fonda LABOR Kyal Arts       TAKE these medications    aspirin 81 MG tablet Take by mouth daily.   dapagliflozin  propanediol 10 MG Tabs tablet Commonly known as: Farxiga  Take 1 tablet (10 mg total) by mouth daily.   FreeStyle Libre 3 Sensor Misc 1 each by Does not apply route every 14 (fourteen) days.   glipiZIDE  5 MG tablet Commonly known as: GLUCOTROL  Take 1 tablet (5 mg total) by mouth daily before supper.   ibuprofen 200 MG tablet Commonly  known as: ADVIL Take 600 mg by mouth every 6 (six) hours as needed.   icosapent  Ethyl 1 g capsule Commonly known as: VASCEPA  Take 2 capsules (2 g total) by mouth 2 (two) times daily. What changed: See the new instructions. Changed by: Fonda LABOR Real Cona   metFORMIN  500 MG 24 hr tablet Commonly known as: GLUCOPHAGE -XR Take 2 tablets (1,000 mg total) by mouth 2 (two) times daily with a meal.   olmesartan  20 MG tablet Commonly known as: BENICAR  Take 0.5 tablets (10 mg total) by mouth daily. Please keep upcoming appointment in January 2025 for future refills. Final refill until appointment. Thank you    OneTouch Delica Lancets 33G Misc Check 1x a day   OneTouch Verio test strip Generic drug: glucose blood USE AS INSTRUCTED TO TEST SUGAR   rosuvastatin  40 MG tablet Commonly known as: CRESTOR  TAKE 1 TABLET BY MOUTH EVERY DAY   Rybelsus  14 MG Tabs Generic drug: Semaglutide  TAKE 1.5 TABLET (21MG ) BY MOUTH ONCE DAILY   Vitamin D  50 MCG (2000 UT) Caps Take 1 capsule by mouth daily.   Vitamins/Minerals Tabs Take by mouth.         Objective:   BP 108/72   Pulse 100   Ht 6' (1.829 m)   Wt 198 lb (89.8 kg)   SpO2 98%   BMI 26.85 kg/m   Wt Readings from Last 3 Encounters:  10/04/23 198 lb (89.8 kg)  07/01/23 196 lb 6.4 oz (89.1 kg)  06/25/23 194 lb (88 kg)    Physical Exam Vitals and nursing note reviewed.  Constitutional:      General: He is not in acute distress.    Appearance: He is well-developed. He is not diaphoretic.  Eyes:     General: No scleral icterus.    Conjunctiva/sclera: Conjunctivae normal.  Neck:     Thyroid : No thyromegaly.  Cardiovascular:     Rate and Rhythm: Normal rate and regular rhythm.     Heart sounds: Normal heart sounds. No murmur heard. Pulmonary:     Effort: Pulmonary effort is normal. No respiratory distress.     Breath sounds: Normal breath sounds. No wheezing.  Musculoskeletal:        General: No swelling. Normal range of motion.     Cervical back: Neck supple.  Lymphadenopathy:     Cervical: No cervical adenopathy.  Skin:    General: Skin is warm and dry.     Findings: No rash.  Neurological:     Mental Status: He is alert and oriented to person, place, and time.     Coordination: Coordination normal.  Psychiatric:        Behavior: Behavior normal.       Assessment & Plan:   Problem List Items Addressed This Visit       Cardiovascular and Mediastinum   Hypertension   Relevant Medications   icosapent  Ethyl (VASCEPA ) 1 g capsule     Endocrine   Diabetes mellitus (HCC) - Primary   Relevant Orders   Bayer DCA  Hb A1c Waived     Genitourinary   BPH (benign prostatic hyperplasia)     Other   Hyperlipidemia   Relevant Medications   icosapent  Ethyl (VASCEPA ) 1 g capsule    A1c was 7.1, just mainly focus on diet.  Blood pressure looks good today. Follow up plan: Return in about 3 months (around 01/02/2024), or if symptoms worsen or fail to improve, for Diabetes and hypertension and BPH and hyperlipidemia.  Counseling provided for all of the vaccine components Orders Placed This Encounter  Procedures   Bayer DCA Hb A1c Waived    Fonda Levins, MD [facility] 10/04/2023, 11:49 AM

## 2023-10-21 ENCOUNTER — Ambulatory Visit: Payer: BC Managed Care – PPO | Admitting: Cardiology

## 2023-11-03 ENCOUNTER — Ambulatory Visit: Payer: BC Managed Care – PPO | Admitting: Internal Medicine

## 2023-12-24 ENCOUNTER — Other Ambulatory Visit: Payer: Self-pay | Admitting: Family Medicine

## 2024-01-05 ENCOUNTER — Encounter: Payer: Self-pay | Admitting: Family Medicine

## 2024-01-05 ENCOUNTER — Ambulatory Visit: Payer: Medicare Other | Admitting: Family Medicine

## 2024-01-05 VITALS — BP 121/77 | HR 86 | Ht 72.0 in | Wt 196.0 lb

## 2024-01-05 DIAGNOSIS — I10 Essential (primary) hypertension: Secondary | ICD-10-CM | POA: Diagnosis not present

## 2024-01-05 DIAGNOSIS — Z7984 Long term (current) use of oral hypoglycemic drugs: Secondary | ICD-10-CM

## 2024-01-05 DIAGNOSIS — I152 Hypertension secondary to endocrine disorders: Secondary | ICD-10-CM

## 2024-01-05 DIAGNOSIS — R351 Nocturia: Secondary | ICD-10-CM

## 2024-01-05 DIAGNOSIS — Z23 Encounter for immunization: Secondary | ICD-10-CM | POA: Diagnosis not present

## 2024-01-05 DIAGNOSIS — E1169 Type 2 diabetes mellitus with other specified complication: Secondary | ICD-10-CM | POA: Diagnosis not present

## 2024-01-05 DIAGNOSIS — E785 Hyperlipidemia, unspecified: Secondary | ICD-10-CM

## 2024-01-05 DIAGNOSIS — E782 Mixed hyperlipidemia: Secondary | ICD-10-CM | POA: Diagnosis not present

## 2024-01-05 DIAGNOSIS — N401 Enlarged prostate with lower urinary tract symptoms: Secondary | ICD-10-CM | POA: Diagnosis not present

## 2024-01-05 DIAGNOSIS — I2510 Atherosclerotic heart disease of native coronary artery without angina pectoris: Secondary | ICD-10-CM

## 2024-01-05 DIAGNOSIS — E1159 Type 2 diabetes mellitus with other circulatory complications: Secondary | ICD-10-CM

## 2024-01-05 LAB — LIPID PANEL

## 2024-01-05 LAB — BAYER DCA HB A1C WAIVED: HB A1C (BAYER DCA - WAIVED): 6.7 % — ABNORMAL HIGH (ref 4.8–5.6)

## 2024-01-05 MED ORDER — OMEGA-3-ACID ETHYL ESTERS 1 G PO CAPS: 2.0000 g | ORAL_CAPSULE | Freq: Two times a day (BID) | ORAL | 3 refills | Status: AC

## 2024-01-05 MED ORDER — ROSUVASTATIN CALCIUM 40 MG PO TABS: 40.0000 mg | ORAL_TABLET | Freq: Every day | ORAL | 3 refills | Status: AC

## 2024-01-05 NOTE — Progress Notes (Signed)
 BP 121/77   Pulse 86   Ht 6' (1.829 m)   Wt 196 lb (88.9 kg)   SpO2 95%   BMI 26.58 kg/m    Subjective:   Patient ID: Austin Graham, male    DOB: [birth year], 73 y.o.   MRN: [id number]  HPI: Austin Graham is a 73 y.o. male presenting on 01/05/2024 for Medical Management of Chronic Issues and Diabetes   HPI Type 2 diabetes mellitus Patient comes in today for recheck of his diabetes. Patient has been currently taking metformin and Farxiga and Rybelsus. Patient is currently on an ACE inhibitor/ARB. Patient has not seen an ophthalmologist this year. Patient denies any new issues with their feet. The symptom started onset as an adult hypertension and hyperlipidemia ARE RELATED TO DM   Hypertension Patient is currently on olmesartan, and their blood pressure today is 121/77. Patient denies any lightheadedness or dizziness. Patient denies headaches, blurred vision, chest pains, shortness of breath, or weakness. Denies any side effects from medication and is content with current medication.   Hyperlipidemia and CAD recheck Patient is coming in for recheck of his hyperlipidemia. The patient is currently taking Crestor and fish oils. They deny any issues with myalgias or history of liver damage from it. They deny any focal numbness or weakness or chest pain.   Relevant past medical, surgical, family and social history reviewed and updated as indicated. Interim medical history since our last visit reviewed. Allergies and medications reviewed and updated.  Review of Systems  Constitutional:  Negative for chills and fever.  Eyes:  Negative for visual disturbance.  Respiratory:  Negative for shortness of breath and wheezing.   Cardiovascular:  Negative for chest pain and leg swelling.  Musculoskeletal:  Negative for back pain and gait problem.  Skin:  Negative for rash.  Neurological:  Negative for dizziness and numbness.  All other systems reviewed and are negative.   Per HPI unless  specifically indicated above   Allergies as of 01/05/2024       Reactions   Codeine    REACTION: agitation   Erythromycin    REACTION: diarrhea   Latex    rash   Other    Melon- itchy throat        Medication List        Accurate as of January 05, 2024 10:23 AM. If you have any questions, ask your nurse or doctor.          STOP taking these medications    icosapent Ethyl 1 g capsule Commonly known as: VASCEPA Stopped by: Elige Radon Gilman Olazabal       TAKE these medications    aspirin 81 MG tablet Take by mouth daily.   dapagliflozin propanediol 10 MG Tabs tablet Commonly known as: Farxiga Take 1 tablet (10 mg total) by mouth daily.   FreeStyle Libre 3 Sensor Misc 1 each by Does not apply route every 14 (fourteen) days.   glipiZIDE 5 MG tablet Commonly known as: GLUCOTROL Take 1 tablet (5 mg total) by mouth daily before supper.   ibuprofen 200 MG tablet Commonly known as: ADVIL Take 600 mg by mouth every 6 (six) hours as needed.   metFORMIN 500 MG 24 hr tablet Commonly known as: GLUCOPHAGE-XR Take 2 tablets (1,000 mg total) by mouth 2 (two) times daily with a meal.   olmesartan 20 MG tablet Commonly known as: BENICAR Take 0.5 tablets (10 mg total) by mouth daily. Please keep upcoming appointment in January 2025 for  future refills. Final refill until appointment. Thank you   omega-3 acid ethyl esters 1 g capsule Commonly known as: LOVAZA Take 2 capsules (2 g total) by mouth 2 (two) times daily. Started by: Elige Radon Nathifa Ritthaler   OneTouch Delica Lancets 33G Misc Check 1x a day   OneTouch Verio test strip Generic drug: glucose blood USE AS INSTRUCTED TO TEST SUGAR   rosuvastatin 40 MG tablet Commonly known as: CRESTOR Take 1 tablet (40 mg total) by mouth daily.   Rybelsus 14 MG Tabs Generic drug: Semaglutide TAKE 1.5 TABLET (21MG ) BY MOUTH ONCE DAILY   tadalafil 10 MG tablet Commonly known as: CIALIS Take 10 mg by mouth daily as needed for  erectile dysfunction.   Vitamin D 50 MCG (2000 UT) Caps Take 1 capsule by mouth daily.   Vitamins/Minerals Tabs Take by mouth.         Objective:   BP 121/77   Pulse 86   Ht 6' (1.829 m)   Wt 196 lb (88.9 kg)   SpO2 95%   BMI 26.58 kg/m   Wt Readings from Last 3 Encounters:  01/05/24 196 lb (88.9 kg)  10/04/23 198 lb (89.8 kg)  07/01/23 196 lb 6.4 oz (89.1 kg)    Physical Exam Vitals and nursing note reviewed.  Constitutional:      General: He is not in acute distress.    Appearance: He is well-developed. He is not diaphoretic.  Eyes:     General: No scleral icterus.    Conjunctiva/sclera: Conjunctivae normal.  Neck:     Thyroid: No thyromegaly.  Cardiovascular:     Rate and Rhythm: Normal rate and regular rhythm.     Heart sounds: Normal heart sounds. No murmur heard. Pulmonary:     Effort: Pulmonary effort is normal. No respiratory distress.     Breath sounds: Normal breath sounds. No wheezing.  Musculoskeletal:        General: No swelling. Normal range of motion.     Cervical back: Neck supple.  Lymphadenopathy:     Cervical: No cervical adenopathy.  Skin:    General: Skin is warm and dry.     Findings: No rash.  Neurological:     Mental Status: He is alert and oriented to person, place, and time.     Coordination: Coordination normal.  Psychiatric:        Behavior: Behavior normal.       Assessment & Plan:   Problem List Items Addressed This Visit       Cardiovascular and Mediastinum   Hypertension associated with diabetes (HCC)   Relevant Medications   rosuvastatin (CRESTOR) 40 MG tablet   tadalafil (CIALIS) 10 MG tablet   omega-3 acid ethyl esters (LOVAZA) 1 g capsule   Coronary artery disease involving native coronary artery of native heart without angina pectoris   Relevant Medications   rosuvastatin (CRESTOR) 40 MG tablet   tadalafil (CIALIS) 10 MG tablet   omega-3 acid ethyl esters (LOVAZA) 1 g capsule     Endocrine   Diabetes  mellitus (HCC) - Primary   Relevant Medications   rosuvastatin (CRESTOR) 40 MG tablet   Other Relevant Orders   Bayer DCA Hb A1c Waived   CBC with Differential/Platelet   CMP14+EGFR   Lipid panel   Microalbumin/Creatinine Ratio, Urine   Hyperlipidemia associated with type 2 diabetes mellitus (HCC)   Relevant Medications   rosuvastatin (CRESTOR) 40 MG tablet   tadalafil (CIALIS) 10 MG tablet   omega-3 acid  ethyl esters (LOVAZA) 1 g capsule     Genitourinary   BPH (benign prostatic hyperplasia)   Relevant Orders   PSA, total and free    Blood pressure looks good today.  Will switch from Vascepa to Lovaza because of insurance coverage.  He says his wife has the same insurance and it is covered better. Follow up plan: Return in about 3 months (around 04/05/2024), or if symptoms worsen or fail to improve, for Diabetes and hypertension and cholesterol..  Counseling provided for all of the vaccine components Orders Placed This Encounter  Procedures   Bayer DCA Hb A1c Waived   CBC with Differential/Platelet   CMP14+EGFR   Lipid panel   PSA, total and free   Microalbumin/Creatinine Ratio, Urine    Arville Care, MD [place] [facility] 01/05/2024, 10:23 AM

## 2024-01-06 LAB — MICROALBUMIN / CREATININE URINE RATIO
Creatinine, Urine: 109.1 mg/dL
Microalb/Creat Ratio: 22 mg/g{creat} (ref 0–29)
Microalbumin, Urine: 24.3 ug/mL

## 2024-01-06 LAB — LIPID PANEL
Cholesterol, Total: 77 mg/dL — ABNORMAL LOW (ref 100–199)
HDL: 30 mg/dL — ABNORMAL LOW (ref >39)
LDL CALC COMMENT:: 2.6 ratio (ref 0.0–5.0)
LDL Chol Calc (NIH): 29 mg/dL (ref 0–99)
Triglycerides: 91 mg/dL (ref 0–149)
VLDL Cholesterol Cal: 18 mg/dL (ref 5–40)

## 2024-01-06 LAB — CMP14+EGFR
ALT: 35 IU/L (ref 0–44)
AST: 25 IU/L (ref 0–40)
Albumin: 4.5 g/dL (ref 3.8–4.8)
Alkaline Phosphatase: 86 IU/L (ref 44–121)
BUN/Creatinine Ratio: 16 (ref 10–24)
BUN: 17 mg/dL (ref 8–27)
Bilirubin Total: 0.7 mg/dL (ref 0.0–1.2)
CO2: 21 mmol/L (ref 20–29)
Calcium: 9.7 mg/dL (ref 8.6–10.2)
Chloride: 106 mmol/L (ref 96–106)
Creatinine, Ser: 1.06 mg/dL (ref 0.76–1.27)
Globulin, Total: 2 g/dL (ref 1.5–4.5)
Glucose: 133 mg/dL — ABNORMAL HIGH (ref 70–99)
Potassium: 4.4 mmol/L (ref 3.5–5.2)
Sodium: 142 mmol/L (ref 134–144)
Total Protein: 6.5 g/dL (ref 6.0–8.5)
eGFR: 75 mL/min/{1.73_m2} (ref >59)

## 2024-01-06 LAB — CBC WITH DIFFERENTIAL/PLATELET
Basophils Absolute: 0 10*3/uL (ref 0.0–0.2)
Basos: 1 %
EOS (ABSOLUTE): 0.1 10*3/uL (ref 0.0–0.4)
Eos: 2 %
Hematocrit: 40.4 % (ref 37.5–51.0)
Hemoglobin: 14 g/dL (ref 13.0–17.7)
Immature Grans (Abs): 0 10*3/uL (ref 0.0–0.1)
Immature Granulocytes: 0 %
Lymphocytes Absolute: 1.4 10*3/uL (ref 0.7–3.1)
Lymphs: 27 %
MCH: 31.3 pg (ref 26.6–33.0)
MCHC: 34.7 g/dL (ref 31.5–35.7)
MCV: 90 fL (ref 79–97)
Monocytes Absolute: 0.4 10*3/uL (ref 0.1–0.9)
Monocytes: 9 %
Neutrophils Absolute: 3.2 10*3/uL (ref 1.4–7.0)
Neutrophils: 61 %
Platelets: 205 10*3/uL (ref 150–450)
RBC: 4.48 x10E6/uL (ref 4.14–5.80)
RDW: 13 % (ref 11.6–15.4)
WBC: 5.2 10*3/uL (ref 3.4–10.8)

## 2024-01-06 LAB — PSA, TOTAL AND FREE
PSA, Free Pct: 37.5 %
PSA, Free: 0.15 ng/mL
Prostate Specific Ag, Serum: 0.4 ng/mL (ref 0.0–4.0)

## 2024-01-07 ENCOUNTER — Encounter: Payer: Self-pay | Admitting: Family Medicine

## 2024-01-10 ENCOUNTER — Other Ambulatory Visit (HOSPITAL_COMMUNITY): Payer: Self-pay

## 2024-01-10 ENCOUNTER — Telehealth: Payer: Self-pay

## 2024-01-10 NOTE — Telephone Encounter (Signed)
 Pharmacy Patient Advocate Encounter   Received notification from CoverMyMeds that prior authorization for Omega-3-acid Ethyl Esters 1GM capsules  is required/requested.   Insurance verification completed.   The patient is insured through [facility] .   Per test claim: PA required; PA started via CoverMyMeds. KEY [id number] . Waiting for clinical questions to populate.

## 2024-01-11 ENCOUNTER — Other Ambulatory Visit (HOSPITAL_COMMUNITY): Payer: Self-pay

## 2024-01-11 NOTE — Telephone Encounter (Signed)
 Clinical questions have been answered and PA submitted. PA currently Pending.

## 2024-01-11 NOTE — Telephone Encounter (Signed)
 Pharmacy Patient Advocate Encounter  Received notification from [employer] [facility] that Prior Authorization for Omega-3-acid Ethyl Esters 1GM capsules has been APPROVED from 01/11/24 to 01/10/27. Ran test claim, Copay is $10. This test claim was processed through [facility]- copay amounts may vary at other pharmacies due to pharmacy/plan contracts, or as the patient moves through the different stages of their insurance plan.   PA #/Case ID/Reference #: [id number]

## 2024-01-12 ENCOUNTER — Other Ambulatory Visit (HOSPITAL_COMMUNITY): Payer: Self-pay

## 2024-01-17 LAB — HM DIABETES EYE EXAM

## 2024-01-18 NOTE — Progress Notes (Unsigned)
 Cardiology Office Note:   Date:  01/19/2024  ID:  Austin Graham, DOB 1951-03-11, MRN [id number] PCP: Dettinger, Lucio Sabin, MD  [facility] Providers Cardiologist:  Eilleen Grates, MD {  History of Present Illness:   Austin Graham is a 73 y.o. male who presents for evaluation of chest discomfort. He was seen in 2009 for chest pain and had a treadmill test and I saw him a little bit more than 3 years ago when he had chest discomfort again. Stress testing was negative.  He returns for follow up.  He had a negative POET (Plain Old Exercise Treadmill) when I last saw him in 2017.  I saw him earlier this year. Because of shortness of breath and his cardiovascular risk factors I sent him for coronary CTA which demonstrated 25 to 49% right stenosis, 25 to 49% ramus intermedius stenosis and 50 to 70% LAD stenosis.  He has mid and distal LAD FFR were abnormal suggesting flow-limiting stenosis.  His coronary calcium  score was 737 which was 83rd percentile in 2020.   At the last visit I referred him to endocrinology and changed him to Crestor .   He had a low risk perfusion study in 2021.     I last saw him in 2022.  Since I last saw him he has done well.  The patient denies any new symptoms such as chest discomfort, neck or arm discomfort. There has been no new shortness of breath, PND or orthopnea. There have been no reported palpitations, presyncope or syncope.  He does not exercise as much as I would like but he does a lot of bass fishing.     ROS: As stated in the HPI and negative for all other systems.  Studies Reviewed:    EKG:   EKG Interpretation Date/Time:  Wednesday January 19 2024 13:27:33 EDT Ventricular Rate:  93 PR Interval:  160 QRS Duration:  80 QT Interval:  332 QTC Calculation: 412 R Axis:   72  Text Interpretation: Normal sinus rhythm When compared with ECG of 05-Jun-2015 14:44, No significant change was found Confirmed by Eilleen Grates ([id number]) on 01/19/2024 1:43:29 PM      Risk Assessment/Calculations:              Physical Exam:   VS:  BP 102/70   Pulse 93   Ht 6' (1.829 m)   Wt 194 lb (88 kg)   BMI 26.31 kg/m    Wt Readings from Last 3 Encounters:  01/19/24 194 lb (88 kg)  01/05/24 196 lb (88.9 kg)  10/04/23 198 lb (89.8 kg)     GEN: Well nourished, well developed in no acute distress NECK: No JVD; No carotid bruits CARDIAC: RRR, no murmurs, rubs, gallops RESPIRATORY:  Clear to auscultation without rales, wheezing or rhonchi  ABDOMEN: Soft, non-tender, non-distended EXTREMITIES:  No edema; No deformity   ASSESSMENT AND PLAN:   CAD:   The patient has no new sypmtoms.  No further cardiovascular testing is indicated.  We will continue with aggressive risk reduction and meds as listed.  We talked a lot about the need for primary risk reduction given the extent of his plaque.  We talked a lot about presenting with any symptoms and he has absolutely none now.  He is going to start doing some more walking and if he has any symptoms in the future he will notify me.     DM:    He is followed by endocrinology.  A1c is down to  6.7 from 7.0 previously.   HTN: Blood pressure is at target.  No change in therapy.   DYSLIPIDEMIA:   His LDL is 29.  HDL is 30.  I will check an LP(a).  We talked about a plant forward diet.   Follow up with me in 18 months.  Signed, Eilleen Grates, MD

## 2024-01-19 ENCOUNTER — Ambulatory Visit (INDEPENDENT_AMBULATORY_CARE_PROVIDER_SITE_OTHER): Payer: BC Managed Care – PPO | Admitting: Cardiology

## 2024-01-19 ENCOUNTER — Other Ambulatory Visit

## 2024-01-19 ENCOUNTER — Other Ambulatory Visit: Payer: Self-pay | Admitting: *Deleted

## 2024-01-19 ENCOUNTER — Encounter: Payer: Self-pay | Admitting: Cardiology

## 2024-01-19 VITALS — BP 102/70 | HR 93 | Ht 72.0 in | Wt 194.0 lb

## 2024-01-19 DIAGNOSIS — I2510 Atherosclerotic heart disease of native coronary artery without angina pectoris: Secondary | ICD-10-CM | POA: Diagnosis not present

## 2024-01-19 DIAGNOSIS — E785 Hyperlipidemia, unspecified: Secondary | ICD-10-CM

## 2024-01-19 DIAGNOSIS — I10 Essential (primary) hypertension: Secondary | ICD-10-CM

## 2024-01-19 DIAGNOSIS — Z79899 Other long term (current) drug therapy: Secondary | ICD-10-CM

## 2024-01-19 DIAGNOSIS — E118 Type 2 diabetes mellitus with unspecified complications: Secondary | ICD-10-CM | POA: Diagnosis not present

## 2024-01-19 NOTE — Patient Instructions (Signed)
 Medication Instructions:  The current medical regimen is effective;  continue present plan and medications.  *If you need a refill on your cardiac medications before your next appointment, please call your pharmacy*  Lab Work: Please have blood work at [facility]. ([facility])  If you have labs (blood work) drawn today and your tests are completely normal, you will receive your results only by: MyChart Message (if you have MyChart) OR A paper copy in the mail If you have any lab test that is abnormal or we need to change your treatment, we will call you to review the results.  Follow-Up: At [facility], you and your health needs are our priority.  As part of our continuing mission to provide you with exceptional heart care, our providers are all part of one team.  This team includes your primary Cardiologist (physician) and Advanced Practice Providers or APPs (Physician Assistants and Nurse Practitioners) who all work together to provide you with the care you need, when you need it.  Your next appointment:   18  month(s)  Provider:   Eilleen Grates, MD    We recommend signing up for the patient portal called "MyChart".  Sign up information is provided on this After Visit Summary.  MyChart is used to connect with patients for Virtual Visits (Telemedicine).  Patients are able to view lab/test results, encounter notes, upcoming appointments, etc.  Non-urgent messages can be sent to your provider as well.   To learn more about what you can do with MyChart, go to [email or web address].

## 2024-01-20 LAB — LIPOPROTEIN A (LPA): Lipoprotein (a): 18.2 nmol/L

## 2024-01-24 ENCOUNTER — Encounter: Payer: Self-pay | Admitting: *Deleted

## 2024-01-25 ENCOUNTER — Ambulatory Visit (INDEPENDENT_AMBULATORY_CARE_PROVIDER_SITE_OTHER): Payer: BC Managed Care – PPO | Admitting: Internal Medicine

## 2024-01-25 ENCOUNTER — Encounter: Payer: Self-pay | Admitting: *Deleted

## 2024-01-25 ENCOUNTER — Encounter: Payer: Self-pay | Admitting: Internal Medicine

## 2024-01-25 VITALS — BP 118/60 | HR 85 | Ht 72.0 in | Wt 196.2 lb

## 2024-01-25 DIAGNOSIS — E1165 Type 2 diabetes mellitus with hyperglycemia: Secondary | ICD-10-CM | POA: Diagnosis not present

## 2024-01-25 DIAGNOSIS — E1159 Type 2 diabetes mellitus with other circulatory complications: Secondary | ICD-10-CM | POA: Diagnosis not present

## 2024-01-25 DIAGNOSIS — Z7984 Long term (current) use of oral hypoglycemic drugs: Secondary | ICD-10-CM

## 2024-01-25 DIAGNOSIS — E042 Nontoxic multinodular goiter: Secondary | ICD-10-CM

## 2024-01-25 NOTE — Progress Notes (Signed)
 Patient ID: Austin Graham, male   DOB: [birth year], 73 y.o.   MRN: [id number]   HPI: Austin Graham is a 73 y.o.-year-old male, initially referred by his cardiologist, Dr. Lavonne Prairie, returning for follow-up for DM2, dx in ~2013, non-insulin -dependent, uncontrolled, with complications (CAD, CKD) and also thyroid  nodules.Last visit 6 months ago.  Interim history: No blurry vision, nausea, chest pain, increased urination. He had occasional loose stools and constipation while on the higher dose of Rybelsus  but these resolved.  He does have a little bit of stomach discomfort after taking Rybelsus  in the morning, but not too bothersome..  Reviewed history: I saw the patient first in 09/2019.  At that time, we stopped Janumet  and started metformin  ER, we increased Rybelsus  dose and we added Farxiga .  He developed diarrhea, indigestion, loss of appetite, nausea, after the visit, and he stopped all of his medicines.  The symptoms resolved and I advised him to start adding back the medicines one by one.  However, he was lost for follow-up afterwards and returned to see me in 12/2020.  Reviewed latest HbA1c levels: Lab Results  Component Value Date   HGBA1C 6.7 (H) 01/05/2024   HGBA1C 7.1 (H) 10/04/2023   HGBA1C 6.6 (H) 06/25/2023   HGBA1C 8.0 (A) 12/04/2022   HGBA1C 7.3 (H) 06/24/2022   HGBA1C 7.0 (H) 12/18/2021   HGBA1C 7.5 (A) 10/03/2021   HGBA1C 6.7 (H) 04/03/2021   HGBA1C 7.0 (A) 01/14/2021   HGBA1C 8.7 (H) 07/19/2020  06/2022: HbA1c 7.5%  He is on: - Metformin  ER 1000 mg 2x a day with meals (L and D) - Glipizide  2.5 mg before larger dinners >> 5 mg before dinner - Rybelsus  7 >> 14 >>  (forgot) >> 14 >> 21 mg daily - Farxiga  10 mg before b'fast He was previously on Trulicity  >> nausea, lightheadedness.  Pt checks his sugars 1-2x a day and they are: - am: 114-199 (pizza) >> 128-180, 213 >> 103-152, 163 >> 91, 116-151, 161 - 2h after b'fast: 151-196 >> 164, 178 >>  236 >> n/c - before  lunch: 128 >> 101, 144 >> n/c >> 114-130 >> 122 - 2h after lunch: 90-127 >> 168-196 >> 172 >> n/c >> 157-209 - before dinner: n/c >> 112 >> 88 >> n/c - 2h after dinner: 118, 134 >> 106 >> 114-163 >> n/c - bedtime: 180-220 >> n/c >> 164 >> n/c - nighttime: n/c >> 114, 153 >> n/c >> 106-132 Lowest sugar was 89 >> 88 >> 103 >> 91. Highest sugar was 317 >> 236 >> 163 >> 209.  Glucometer: One Touch ultra  Pt's meals are: - Breakfast: oatmeal, cereals, granola - Lunch: sandwich on rye or whole wheat; bacon and eggs - Dinner: vegetable, potatoes, meat or pasta - Snacks: 2-3 -stopped sweets  - + mild CKD, last BUN/creatinine:  Lab Results  Component Value Date   BUN 17 01/05/2024   BUN 15 06/25/2023   CREATININE 1.06 01/05/2024   CREATININE 1.28 (H) 06/25/2023   Lab Results  Component Value Date   MICRALBCREAT 22 01/05/2024   MICRALBCREAT 13 12/23/2022  On Benicar  20.  -+ HL; last set of lipids: Lab Results  Component Value Date   CHOL 77 (L) 01/05/2024   HDL 30 (L) 01/05/2024   LDLCALC 29 01/05/2024   TRIG 91 01/05/2024   CHOLHDL 2.6 01/05/2024  On Crestor  40, Lovaza  << Vascepa .  - last eye exam was on 01/17/2024.  No DR.  - no numbness and tingling in his  feet. He has a h/o plantar fasciitis.  Last foot exam 06/25/2023.  Pt has FH of DM in F, S, GM.  He also has a history of HTN, GERD, multinodular goiter.  Thyroid  ultrasound (07/05/2012) showed very small nodules, stable: Right thyroid  lobe:  5.0 x 1.7 x 2.3 cm. Left thyroid  lobe:  5.6 x 1.9 x 2.4 cm. Isthmus:   Focal nodules:  Multiple bilateral small thyroid  nodules.  The largest on the right is 10 mm in the lower pole compared 13 mm previously.  The largest on the left is 10 mm in the mid pole compared to 9 mm on the previous study.  No significant change.  No dominant nodule.   Lymphadenopathy:   None.   IMPRESSION: Numerous bilateral small thyroid  nodules, none significantly changed.  He saw Dr. Lauralee Poll  in the past and had FNAs:   Date Taken: 05/10/2008   Satisfactory for evaluation.    INTERPRETATION(S):   THYROID , LEFT, FINE NEEDLE ASPIRATION (THIN PREP, SMEARS BLOCK):   - FINDINGS CONSISTENT WITH NON-NEOPLASTIC GOITER.   - SEE COMMENT.   Date Taken: 05/10/2008  Satisfactory for evaluation.    INTERPRETATION(S):   THYROID , RIGHT, FINE NEEDLE ASPIRATION (THIN PREP, SMEARS BLOCK):   - FINDINGS CONSISTENT WITH NON-NEOPLASTIC GOITER.   - SEE COMMENT.   Thyroid  ultrasound (11/03/2019): Parenchymal Echotexture: Moderately heterogenous Isthmus: 0.4 cm thickness, stable Right lobe: 5.6 x 1.7 x 2.1 cm, previously 5.3 x 2.1 x 2.1 Left lobe: 4.9 x 2 x 1.9 cm, previously 5.4 x 2.2 x 1.7 _____________________________________________________________   Nodule # 1: Prior biopsy: No Location: Right; Inferior Maximum size: 0.98 cm; Other 2 dimensions: 0.8 x 0.8 cm, previously, 0.7 x 0.6 x 0.6 cm  Composition: mixed cystic and solid (1)  Echogenicity: hypoechoic (2) Significant change in size (>/= 20% in two dimensions and minimal increase of 2 mm): Yes *Given size (<1.4 cm) and appearance, this nodule does NOT meet TI-RADS criteria for biopsy or dedicated follow-up. ________________________________________________________   0.6 cm mixed solid/cystic inferior left nodule, previously 0.7; This nodule does NOT meet TI-RADS criteria for biopsy or dedicated follow-up.   Additional scattered hypoechoic lesions throughout both lobes all less than 0.5 cm diameter.   IMPRESSION: 1. Stable mild thyromegaly with scattered subcentimeter nodules. None meet criteria for biopsy or dedicated imaging follow-up.  Reviewed latest TSH: Lab Results  Component Value Date   TSH 1.95 10/27/2019   TSH 2.710 02/10/2018   TSH 3.820 04/01/2016   Pt denies: - feeling nodules in neck - hoarseness - dysphagia - choking  No FH of thyroid  cancer. No h/o radiation tx to head or neck. +FH of thyroid  ds in  sister, GM  He also has BPH with nocturia.  ROS: + See HPI  I reviewed pt's medications, allergies, PMH, social hx, family hx, and changes were documented in the history of present illness. Otherwise, unchanged from my initial visit note.  Past Medical History:  Diagnosis Date   Arthritis    Asthma    as child   Complication of anesthesia    hard to waken   Diabetes (HCC)    type II   Diverticulosis    Dysrhythmia    PAC's   Goiter    History of hiatal hernia    Hyperlipidemia    Hypertension    Kidney stones    Stress fracture    left knee, right hip   Thyroid  disease    Past Surgical History:  Procedure Laterality Date  CYSTOSCOPY WITH INSERTION OF UROLIFT     JOINT REPLACEMENT     KIDNEY STONES  2006   SKIN CANCER DESTRUCTION Left    deltoid- [facility]   SKIN CANCER EXCISION Right 09/16/2018   shoulder    TONSILLECTOMY     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   Procedure: RIGHT TOTAL HIP ARTHROPLASTY ANTERIOR APPROACH;  Surgeon: Liliane Rei, MD;  Location: [facility];  Service: Orthopedics;  Laterality: Right;   WISDOM TOOTH EXTRACTION     Social History   Socioeconomic History   Marital status: Married    Spouse name: Not on file   Number of children: 3   Years of education: Not on file   Highest education level: Not on file  Occupational History    Employer:  Funeral director's assistant  Tobacco Use   Smoking status: Former Smoker    Quit date: 11/02/1974    Years since quitting: 45.0   Smokeless tobacco: Never Used  Substance and Sexual Activity   Alcohol use: Yes    Alcohol/week: 1.0 - 2.0 standard drinks    Types: 1 - 2 Standard drinks or equivalent per week    Comment: 2-3 PER WEEK   Drug use: no    Types:    Sexual activity: Not on file  Other Topics Concern   Not on file  Social History Narrative   Lives at home with wife.     Social Determinants of Health   Financial Resource Strain:    Difficulty of Paying Living Expenses: Not on file   Food Insecurity:    Worried About Programme researcher, broadcasting/film/video in the Last Year: Not on file   [employer] of Food in the Last Year: Not on file  Transportation Needs:    Lack of Transportation (Medical): Not on file   Lack of Transportation (Non-Medical): Not on file  Physical Activity:    Days of Exercise per Week: Not on file   Minutes of Exercise per Session: Not on file  Stress:    Feeling of Stress : Not on file  Social Connections:    Frequency of Communication with Friends and Family: Not on file   Frequency of Social Gatherings with Friends and Family: Not on file   Attends Religious Services: Not on file   Active Member of Clubs or Organizations: Not on file   Attends Banker Meetings: Not on file   Marital Status: Not on file  Intimate Partner Violence:    Fear of Current or Ex-Partner: Not on file   Emotionally Abused: Not on file   Physically Abused: Not on file   Sexually Abused: Not on file   Current Outpatient Medications on File Prior to Visit  Medication Sig Dispense Refill   aspirin 81 MG tablet Take by mouth daily.      Cholecalciferol (VITAMIN D ) 2000 units CAPS Take 1 capsule by mouth daily.     Continuous Blood Gluc Sensor (FREESTYLE LIBRE 3 SENSOR) MISC 1 each by Does not apply route every 14 (fourteen) days. 6 each 3   dapagliflozin  propanediol (FARXIGA ) 10 MG TABS tablet Take 1 tablet (10 mg total) by mouth daily. 90 tablet 3   glipiZIDE  (GLUCOTROL ) 5 MG tablet Take 1 tablet (5 mg total) by mouth daily before supper. 90 tablet 3   ibuprofen (ADVIL) 200 MG tablet Take 600 mg by mouth every 6 (six) hours as needed.     metFORMIN  (GLUCOPHAGE -XR) 500 MG 24 hr tablet Take 2  tablets (1,000 mg total) by mouth 2 (two) times daily with a meal. 360 tablet 3   olmesartan  (BENICAR ) 20 MG tablet Take 0.5 tablets (10 mg total) by mouth daily. Please keep upcoming appointment in January 2025 for future refills. Final refill until appointment. Thank you 90 tablet 0    omega-3 acid ethyl esters (LOVAZA ) 1 g capsule Take 2 capsules (2 g total) by mouth 2 (two) times daily. 360 capsule 3   OneTouch Delica Lancets 33G MISC Check 1x a day 100 each 3   ONETOUCH VERIO test strip USE AS INSTRUCTED TO TEST SUGAR 100 strip 3   rosuvastatin  (CRESTOR ) 40 MG tablet Take 1 tablet (40 mg total) by mouth daily. 90 tablet 3   RYBELSUS  14 MG TABS TAKE 1.5 TABLET (21MG ) BY MOUTH ONCE DAILY 150 tablet 3   tadalafil  (CIALIS ) 10 MG tablet Take 10 mg by mouth daily as needed for erectile dysfunction.     Vitamins/Minerals TABS Take by mouth.     No current facility-administered medications on file prior to visit.   Allergies  Allergen Reactions   Codeine     REACTION: agitation   Erythromycin     REACTION: diarrhea   Latex     rash   Other     Melon- itchy throat   Family History  Problem Relation Age of Onset   Breast cancer Mother 18   Hypertension Mother    Cancer Mother    CAD Father 62       Died age 30 after CABG   Stroke Father    Hypertension Father    AAA (abdominal aortic aneurysm) Father    Hyperlipidemia Father    Heart disease Father    Cancer Sister        ovarian   Diabetes Sister    Thyroid  disease Sister    Colon polyps Neg Hx    Colon cancer Neg Hx    Esophageal cancer Neg Hx    Stomach cancer Neg Hx    Rectal cancer Neg Hx    PE: BP 118/60   Pulse 85   Ht 6' (1.829 m)   Wt 196 lb 3.2 oz (89 kg)   SpO2 99%   BMI 26.61 kg/m  Wt Readings from Last 3 Encounters:  01/25/24 196 lb 3.2 oz (89 kg)  01/19/24 194 lb (88 kg)  01/05/24 196 lb (88.9 kg)   Constitutional: overweight, in NAD Eyes:  EOMI, no exophthalmos ENT: no neck masses, no cervical lymphadenopathy Cardiovascular: RRR, No MRG Respiratory: CTA B Musculoskeletal: no deformities Skin:no rashes Neurological: no tremor with outstretched hands  ASSESSMENT: 1. DM2, non-insulin -dependent, uncontrolled, with complications - CAD - CKD  Component     Latest Ref Rng &  Units 01/14/2021  Glucose     65 - 99 mg/dL 161 (H)  Hemoglobin W9U     4.0 - 5.6 % 7.0 (A)  C-Peptide     0.80 - 3.85 ng/mL 2.54  Islet Cell Ab     Neg:<1:1 Negative  ZNT8 Antibodies     <15 U/mL <10  Glutamic Acid Decarb Ab     <5 IU/mL <5  No signs of insulin  deficiency or pancreatic autoimmunity.  2. HL  3. MNG  PLAN:  1. Patient with longstanding, uncontrolled, type 2 diabetes, on oral antidiabetic regimen with metformin , SGLT2 inhibitor, GLP-1 receptor agonist and sulfonylurea, with improved control.  An HbA1c obtained at the beginning of the year was higher, at 7.1%, but this  improved to 6.7% at the beginning of this month.  At last visit, sugars were better, at or slightly higher than the target range in the morning but he was not checking later in the day.  I advised him to start checking at different times of the day, rotating check times, but I did not feel he needed a change in his regimen at that time.  Of note, we are using a slightly higher dose of Rybelsus  for him, 21 mg before breakfast.  He feels that this made a significant difference in his blood sugars. -At today's visit, sugars appear to be mostly slightly above target in the morning and he is not checking consistently later in the day, but they are mostly at goal when checked.  He had only 1 sugar higher than 200 in the last month and a half.  However, he did not check his blood sugars while at the beach earlier this month. -We discussed about improving diet, in particular, avoiding pizza and also improving breakfast.  Given examples of healthier breakfast. - I suggested to:  Patient Instructions  Please continue: - Metformin  ER 1000 mg 2x a day with meals  - Farxiga  10 mg before b'fast - Glipizide  5 mg before dinner - Rybelsus  21 mg before b'fast  Breakfast examples: - oatmeal + berries or apples + cinnamon + nuts - kamut cereal bowl - whole wheat bread + avocado or humus + veggies  Example for cereal  bowl: - Kamut puff cereals - 1/2 cup - fruit: berries, 1 small banana or half a banana - chia seeds - 1 scoop - ground flax - 1 scoop - +/- Orgain plant-based protein powder - 1 scoop or vanilla - splash or cinnamon - 1 teaspoon or apple pie spice - 1 teaspoon - 1/4 cup of mixed unsalted nuts - almond milk   Please return in 4 months with your sugar log.   - advised to check sugars at different times of the day - 1x a day, rotating check times - advised for yearly eye exams >> he is UTD - return to clinic in 4 months  2. HL - His lipid panel was at goal with the exception of a slightly low HDL: Lab Results  Component Value Date   CHOL 77 (L) 01/05/2024   HDL 30 (L) 01/05/2024   LDLCALC 29 01/05/2024   TRIG 91 01/05/2024   CHOLHDL 2.6 01/05/2024  -He is on Crestor  40 mg daily and now Lovaza  (Vascepa  was too expensive) - without side effects  3. MNG -No neck compression symptoms -Today we reviewed previous investigation: -This was previously investigated by ultrasound  In 2013 and also FNA in 2019: Benign  -Another ultrasound was obtained in 10/2019 and this showed only 1 nodule measuring slightly less than 1 cm, with the rest of the nodules even smaller - Will continue to follow him clinically.  No further imaging is necessary. - TSH was normal: Lab Results  Component Value Date   TSH 1.95 10/27/2019   Emilie Harden, MD PhD [facility] Endocrinology

## 2024-01-25 NOTE — Patient Instructions (Addendum)
 Please continue: - Metformin  ER 1000 mg 2x a day with meals  - Farxiga  10 mg before b'fast - Glipizide  5 mg before dinner - Rybelsus  21 mg before b'fast  Breakfast examples: - oatmeal + berries or apples + cinnamon + nuts - kamut cereal bowl - whole wheat bread + avocado or humus + veggies  Example for cereal bowl: - Kamut puff cereals - 1/2 cup - fruit: berries, 1 small banana or half a banana - chia seeds - 1 scoop - ground flax - 1 scoop - +/- Orgain plant-based protein powder - 1 scoop or vanilla - splash or cinnamon - 1 teaspoon or apple pie spice - 1 teaspoon - 1/4 cup of mixed unsalted nuts - almond milk   Please return in 4 months with your sugar log.

## 2024-01-27 ENCOUNTER — Other Ambulatory Visit: Payer: Self-pay | Admitting: Cardiology

## 2024-01-27 DIAGNOSIS — I10 Essential (primary) hypertension: Secondary | ICD-10-CM

## 2024-03-22 ENCOUNTER — Other Ambulatory Visit: Payer: Self-pay | Admitting: Family Medicine

## 2024-03-22 DIAGNOSIS — N529 Male erectile dysfunction, unspecified: Secondary | ICD-10-CM

## 2024-03-22 NOTE — Telephone Encounter (Signed)
 Last OV 01/05/24. Last RF filled by historical provider. Next OV 04/19/24

## 2024-04-19 ENCOUNTER — Ambulatory Visit: Admitting: Family Medicine

## 2024-05-02 ENCOUNTER — Other Ambulatory Visit: Payer: Self-pay | Admitting: Family Medicine

## 2024-05-02 ENCOUNTER — Other Ambulatory Visit: Payer: Self-pay | Admitting: Internal Medicine

## 2024-05-02 DIAGNOSIS — N529 Male erectile dysfunction, unspecified: Secondary | ICD-10-CM

## 2024-05-17 ENCOUNTER — Telehealth: Payer: Self-pay | Admitting: Internal Medicine

## 2024-05-17 MED ORDER — ACCU-CHEK FASTCLIX LANCETS MISC: 3 refills | Status: AC

## 2024-05-17 MED ORDER — ACCU-CHEK GUIDE TEST VI STRP: ORAL_STRIP | 12 refills | Status: AC

## 2024-05-17 MED ORDER — ACCU-CHEK GUIDE ME W/DEVICE KIT: PACK | 0 refills | Status: AC

## 2024-05-17 NOTE — Telephone Encounter (Signed)
 Patient is calling to say that insurance is no longer covering the One Touch Supplies and wants to know what he should use.  Per patient his insurance is recommending: 1)  Accu-Chek Aviva Plus Test Strips and Kit  2)  Accu-Chek Guide Test Strips and Kit  3)  Accu-Chek SmartView Test Strips and Kit  Patient states that he is out of test strips now and needs prescription sent to   [id number] - [place], [place] - [address] (Ph: [phone])

## 2024-05-24 ENCOUNTER — Encounter: Payer: Self-pay | Admitting: Family Medicine

## 2024-05-24 ENCOUNTER — Ambulatory Visit (INDEPENDENT_AMBULATORY_CARE_PROVIDER_SITE_OTHER): Admitting: Family Medicine

## 2024-05-24 VITALS — BP 107/61 | HR 93 | Temp 97.5°F | Ht 72.0 in | Wt 195.4 lb

## 2024-05-24 DIAGNOSIS — E785 Hyperlipidemia, unspecified: Secondary | ICD-10-CM

## 2024-05-24 DIAGNOSIS — I152 Hypertension secondary to endocrine disorders: Secondary | ICD-10-CM | POA: Diagnosis not present

## 2024-05-24 DIAGNOSIS — E1169 Type 2 diabetes mellitus with other specified complication: Secondary | ICD-10-CM

## 2024-05-24 DIAGNOSIS — Z7984 Long term (current) use of oral hypoglycemic drugs: Secondary | ICD-10-CM

## 2024-05-24 DIAGNOSIS — E1159 Type 2 diabetes mellitus with other circulatory complications: Secondary | ICD-10-CM

## 2024-05-24 DIAGNOSIS — I2510 Atherosclerotic heart disease of native coronary artery without angina pectoris: Secondary | ICD-10-CM | POA: Diagnosis not present

## 2024-05-24 LAB — BAYER DCA HB A1C WAIVED: HB A1C (BAYER DCA - WAIVED): 7.3 % — ABNORMAL HIGH (ref 4.8–5.6)

## 2024-05-24 NOTE — Progress Notes (Addendum)
 BP 107/61   Pulse 93   Temp (!) 97.5 F (36.4 C) (Temporal)   Ht 6' (1.829 m)   Wt 195 lb 6.4 oz (88.6 kg)   SpO2 96%   BMI 26.50 kg/m    Subjective:   Patient ID: Austin Graham, male    DOB: 10/15/1950, 73 y.o.   MRN: [id number]  HPI: Austin Graham is a 73 y.o. male presenting on 05/24/2024 for Medical Management of Chronic Issues (3 month)   Discussed the use of AI scribe software for clinical note transcription with the patient, who gave verbal consent to proceed.  History of Present Illness   Austin Graham is a 73 year old male with diabetes, hypertension, hyperlipidemia, and coronary artery disease who presents for a recheck of his chronic conditions.  He has not been able to check his blood sugar levels recently due to insurance issues with his test strips and kit, but plans to pick up a new kit today. He continues to take Farxiga , metformin , glipizide , and Rybelsus  for his diabetes. Rybelsus  affects his appetite, as he does not feel ravenously hungry and has lost interest in some foods, but he has not experienced weight loss. He is concerned about his A1c levels, which have not been checked yet today.  Regarding hyperlipidemia, he is taking Crestor  and has not experienced any problems with this medication. He did not have lab work done for cholesterol levels today, as it is typically done every six months.  He mentioned recent treatment for trigger finger, having received injections in his fingers. The injections have helped with the catching sensation, but one finger still does not feel completely normal.  He had a recent dermatological procedure for a skin lesion that was not melanoma but was described as 'very abnormal.' A sample was taken, and further excision was performed yesterday. Dr. Shona is managing this condition.  He met with his cardiologist about a month ago, and his cardiac numbers were good.      Relevant past medical, surgical, family and  social history reviewed and updated as indicated. Interim medical history since our last visit reviewed. Allergies and medications reviewed and updated.  Review of Systems  Constitutional:  Negative for chills and fever.  Eyes:  Negative for visual disturbance.  Respiratory:  Negative for shortness of breath and wheezing.   Cardiovascular:  Negative for chest pain and leg swelling.  Musculoskeletal:  Negative for back pain and gait problem.  Skin:  Negative for rash.  All other systems reviewed and are negative.   Per HPI unless specifically indicated above   Allergies as of 05/24/2024       Reactions   Codeine    REACTION: agitation   Erythromycin    REACTION: diarrhea   Latex    rash   Other    Melon- itchy throat        Medication List        Accurate as of May 24, 2024  3:31 PM. If you have any questions, ask your nurse or doctor.          Accu-Chek Guide Me w/Device Kit Use to check blood sugar three times daily   aspirin 81 MG tablet Take by mouth daily.   dapagliflozin  propanediol 10 MG Tabs tablet Commonly known as: Farxiga  Take 1 tablet (10 mg total) by mouth daily.   FreeStyle Libre 3 Sensor Misc 1 each by Does not apply route every 14 (fourteen) days.   glipiZIDE  5 MG  tablet Commonly known as: GLUCOTROL  Take 1 tablet (5 mg total) by mouth daily before supper.   ibuprofen 200 MG tablet Commonly known as: ADVIL Take 600 mg by mouth every 6 (six) hours as needed.   metFORMIN  500 MG 24 hr tablet Commonly known as: GLUCOPHAGE -XR Take 2 tablets (1,000 mg total) by mouth 2 (two) times daily with a meal.   olmesartan  20 MG tablet Commonly known as: BENICAR  TAKE 1/2 TABLET DAILY. PLEASE KEEP UPCOMING APPOINTMENT IN JANUARY 2025 FOR FUTURE REFILLS   omega-3 acid ethyl esters 1 g capsule Commonly known as: LOVAZA  Take 2 capsules (2 g total) by mouth 2 (two) times daily.   OneTouch Delica Lancets 33G Misc Check 1x a day   Accu-Chek  FastClix Lancets Misc Use to check blood sugar three times daily   OneTouch Verio test strip Generic drug: glucose blood Use to check blood sugar 3 times a day   Accu-Chek Guide Test test strip Generic drug: glucose blood Use to check blood sugar three times daily   rosuvastatin  40 MG tablet Commonly known as: CRESTOR  Take 1 tablet (40 mg total) by mouth daily.   Rybelsus  14 MG Tabs Generic drug: Semaglutide  TAKE 1.5 TABLET (21MG ) BY MOUTH ONCE DAILY   tadalafil  10 MG tablet Commonly known as: CIALIS  TAKE 1 TABLET (10 MG TOTAL) BY MOUTH EVERY OTHER DAY AS NEEDED FOR ERECTILE DYSFUNCTION.   Vitamin D  50 MCG (2000 UT) Caps Take 1 capsule by mouth daily.   Vitamins/Minerals Tabs Take by mouth.         Objective:   BP 107/61   Pulse 93   Temp (!) 97.5 F (36.4 C) (Temporal)   Ht 6' (1.829 m)   Wt 195 lb 6.4 oz (88.6 kg)   SpO2 96%   BMI 26.50 kg/m   Wt Readings from Last 3 Encounters:  05/24/24 195 lb 6.4 oz (88.6 kg)  01/25/24 196 lb 3.2 oz (89 kg)  01/19/24 194 lb (88 kg)    Physical Exam Physical Exam   MEASUREMENTS: Weight- 195. NECK: Thyroid  normal, no thyromegaly. CHEST: Lungs clear to auscultation bilaterally. CARDIOVASCULAR: Regular rate and rhythm, no murmurs. EXTREMITIES: No edema, pulses palpable.         Assessment & Plan:   Problem List Items Addressed This Visit       Cardiovascular and Mediastinum   Hypertension associated with diabetes (HCC)   Coronary artery disease involving native coronary artery of native heart without angina pectoris     Endocrine   Diabetes mellitus (HCC) - Primary   Relevant Orders   Bayer DCA Hb A1c Waived   Hyperlipidemia associated with type 2 diabetes mellitus (HCC)        Type 2 diabetes mellitus with hypertension, hyperlipidemia, and coronary artery disease Type 2 diabetes mellitus with associated hypertension, hyperlipidemia, and coronary artery disease. He is on Farxiga , metformin , glipizide ,  and Rybelsus , which affects appetite without weight loss. A1c levels are pending. Crestor  is well-tolerated. Cardiologist reported good numbers with follow-up in 18 months unless symptoms arise. - Check A1c levels today. - Continue Farxiga , metformin , glipizide , and Rybelsus . - Continue Crestor  for hyperlipidemia. - Pick up new test strips for blood sugar monitoring. - Follow up with endocrinologist, Dr. Tyrell, next Tuesday. - Follow up with cardiologist in 18 months or sooner if symptoms arise. - A1c is 7.3 which is up, discussed doing medicine changes versus diet and he is opted for diet first and he will focus on the but if  not working then we may have to switch his Rybelsus  to Ozempic   Trigger finger, right and left hand Trigger finger in both hands with prior injections. Right hand improving; left hand still symptomatic. Surgery is last resort. - Consider a second injection for the left hand if symptoms persist. - Consider surgical intervention if the second injection does not resolve symptoms.      Follow up plan: Return in about 3 months (around 08/24/2024), or if symptoms worsen or fail to improve, for Diabetes .  Counseling provided for all of the vaccine components Orders Placed This Encounter  Procedures   Bayer DCA Hb A1c Waived    Fonda Levins, MD [facility] 05/24/2024, 3:31 PM

## 2024-05-26 ENCOUNTER — Other Ambulatory Visit (HOSPITAL_COMMUNITY): Payer: Self-pay

## 2024-05-30 ENCOUNTER — Ambulatory Visit: Admitting: Internal Medicine

## 2024-06-26 ENCOUNTER — Other Ambulatory Visit: Payer: Self-pay | Admitting: Internal Medicine

## 2024-07-04 ENCOUNTER — Other Ambulatory Visit: Payer: Self-pay | Admitting: Family Medicine

## 2024-07-04 DIAGNOSIS — N529 Male erectile dysfunction, unspecified: Secondary | ICD-10-CM

## 2024-07-12 ENCOUNTER — Other Ambulatory Visit: Payer: Self-pay | Admitting: Internal Medicine

## 2024-07-12 DIAGNOSIS — E1159 Type 2 diabetes mellitus with other circulatory complications: Secondary | ICD-10-CM

## 2024-07-26 ENCOUNTER — Ambulatory Visit: Admitting: Internal Medicine

## 2024-07-27 ENCOUNTER — Other Ambulatory Visit: Payer: Self-pay | Admitting: Internal Medicine

## 2024-08-05 ENCOUNTER — Other Ambulatory Visit: Payer: Self-pay | Admitting: Internal Medicine

## 2024-08-10 ENCOUNTER — Ambulatory Visit: Admitting: Internal Medicine

## 2024-08-10 ENCOUNTER — Encounter: Payer: Self-pay | Admitting: Internal Medicine

## 2024-08-10 VITALS — BP 112/60 | HR 92 | Ht 72.0 in | Wt 196.6 lb

## 2024-08-10 DIAGNOSIS — E1159 Type 2 diabetes mellitus with other circulatory complications: Secondary | ICD-10-CM | POA: Diagnosis not present

## 2024-08-10 DIAGNOSIS — E782 Mixed hyperlipidemia: Secondary | ICD-10-CM

## 2024-08-10 DIAGNOSIS — E1165 Type 2 diabetes mellitus with hyperglycemia: Secondary | ICD-10-CM | POA: Diagnosis not present

## 2024-08-10 DIAGNOSIS — E042 Nontoxic multinodular goiter: Secondary | ICD-10-CM | POA: Diagnosis not present

## 2024-08-10 DIAGNOSIS — Z7984 Long term (current) use of oral hypoglycemic drugs: Secondary | ICD-10-CM

## 2024-08-10 NOTE — Progress Notes (Signed)
 Patient ID: Austin Graham, male   DOB: 1950/10/20, 73 y.o.   MRN: [id number]   HPI: Austin Graham is a 73 y.o.-year-old male, initially referred by his cardiologist, Dr. Lavona, returning for follow-up for DM2, dx in ~2013, non-insulin -dependent, uncontrolled, with complications (CAD, CKD) and also thyroid  nodules.Last visit 6 months ago.  Interim history: No blurry vision, nausea, chest pain, increased urination but has nocturia 2-3x a night.  On tadalafil  for this.  He tried Flomax  but this did not help. He had occasional loose stools and constipation while on the higher dose of Rybelsus  but these resolved.  Dinners are later now after we changed her work schedule.  Reviewed history: I saw the patient first in 09/2019.  At that time, we stopped Janumet  and started metformin  ER, we increased Rybelsus  dose and we added Farxiga .  He developed diarrhea, indigestion, loss of appetite, nausea, after the visit, and he stopped all of his medicines.  The symptoms resolved and I advised him to start adding back the medicines one by one.  However, he was lost for follow-up afterwards and returned to see me in 12/2020.  Reviewed latest HbA1c levels: Lab Results  Component Value Date   HGBA1C 7.3 (H) 05/24/2024   HGBA1C 6.7 (H) 01/05/2024   HGBA1C 7.1 (H) 10/04/2023   HGBA1C 6.6 (H) 06/25/2023   HGBA1C 8.0 (A) 12/04/2022   HGBA1C 7.3 (H) 06/24/2022   HGBA1C 7.0 (H) 12/18/2021   HGBA1C 7.5 (A) 10/03/2021   HGBA1C 6.7 (H) 04/03/2021   HGBA1C 7.0 (A) 01/14/2021  06/2022: HbA1c 7.5%  He is on: - Metformin  ER 1000 mg 2x a day with meals (L and D) - Glipizide  2.5 mg before larger dinners >> 5 mg before dinner - Rybelsus  7 >> 14 >>  (forgot) >> 14 >> 21 mg daily - Farxiga  10 mg before b'fast He was previously on Trulicity  >> nausea, lightheadedness.  Pt checks his sugars 1-2x a day and they are: - am: 128-180, 213 >> 103-152, 163 >> 91, 116-151, 161 >> 112-193 - 2h after b'fast: 151-196 >>  164, 178 >>  236 >> n/c >> 159-197 - before lunch: 128 >> 101, 144 >> n/c >> 114-130 >> 122 >> 125-158 - 2h after lunch: 168-196 >> 172 >> n/c >> 157-209 >> 127-187, 234 - before dinner: n/c >> 112 >> 88 >> n/c >> 117, 128 - 2h after dinner: 118, 134 >> 106 >> 114-163 >> n/c >> 120-209, 302 - bedtime: 180-220 >> n/c >> 164 >> n/c  - nighttime: n/c >> 114, 153 >> n/c >> 106-132 >> 109-180 Lowest sugar was 89 >> 88 >> 103 >> 91 >> 109 Highest sugar was 317 >> 236 >> 163 >> 209 >> 302.  Glucometer: One Touch ultra  Pt's meals are: - Breakfast: oatmeal, cereals, granola - Lunch: sandwich on rye or whole wheat; bacon and eggs - Dinner: vegetable, potatoes, meat or pasta - Snacks: 2-3 -stopped sweets  - + mild CKD, last BUN/creatinine:  Lab Results  Component Value Date   BUN 17 01/05/2024   BUN 15 06/25/2023   CREATININE 1.06 01/05/2024   CREATININE 1.28 (H) 06/25/2023   Lab Results  Component Value Date   MICRALBCREAT 22 01/05/2024   MICRALBCREAT 13 12/23/2022  On Benicar  20.  -+ HL; last set of lipids: Lab Results  Component Value Date   CHOL 77 (L) 01/05/2024   HDL 30 (L) 01/05/2024   LDLCALC 29 01/05/2024   TRIG 91 01/05/2024   CHOLHDL  2.6 01/05/2024  On Crestor  40, Lovaza  << Vascepa .  - last eye exam was on 01/17/2024.  No DR.  - no numbness and tingling in his feet. He has a h/o plantar fasciitis.  Last foot exam 06/25/2023.  Pt has FH of DM in F, S, GM.  He also has a history of HTN, GERD, multinodular goiter.  Thyroid  ultrasound (07/05/2012) showed very small nodules, stable: Right thyroid  lobe:  5.0 x 1.7 x 2.3 cm. Left thyroid  lobe:  5.6 x 1.9 x 2.4 cm. Isthmus:   Focal nodules:  Multiple bilateral small thyroid  nodules.  The largest on the right is 10 mm in the lower pole compared 13 mm previously.  The largest on the left is 10 mm in the mid pole compared to 9 mm on the previous study.  No significant change.  No dominant nodule.   Lymphadenopathy:    None.   IMPRESSION: Numerous bilateral small thyroid  nodules, none significantly changed.  He saw Dr. Gail in the past and had FNAs:   Date Taken: 05/10/2008   Satisfactory for evaluation.    INTERPRETATION(S):   THYROID , LEFT, FINE NEEDLE ASPIRATION (THIN PREP, SMEARS BLOCK):   - FINDINGS CONSISTENT WITH NON-NEOPLASTIC GOITER.   - SEE COMMENT.   Date Taken: 05/10/2008  Satisfactory for evaluation.    INTERPRETATION(S):   THYROID , RIGHT, FINE NEEDLE ASPIRATION (THIN PREP, SMEARS BLOCK):   - FINDINGS CONSISTENT WITH NON-NEOPLASTIC GOITER.   - SEE COMMENT.   Thyroid  ultrasound (11/03/2019): Parenchymal Echotexture: Moderately heterogenous Isthmus: 0.4 cm thickness, stable Right lobe: 5.6 x 1.7 x 2.1 cm, previously 5.3 x 2.1 x 2.1 Left lobe: 4.9 x 2 x 1.9 cm, previously 5.4 x 2.2 x 1.7 _____________________________________________________________   Nodule # 1: Prior biopsy: No Location: Right; Inferior Maximum size: 0.98 cm; Other 2 dimensions: 0.8 x 0.8 cm, previously, 0.7 x 0.6 x 0.6 cm  Composition: mixed cystic and solid (1)  Echogenicity: hypoechoic (2) Significant change in size (>/= 20% in two dimensions and minimal increase of 2 mm): Yes *Given size (<1.4 cm) and appearance, this nodule does NOT meet TI-RADS criteria for biopsy or dedicated follow-up. ________________________________________________________   0.6 cm mixed solid/cystic inferior left nodule, previously 0.7; This nodule does NOT meet TI-RADS criteria for biopsy or dedicated follow-up.   Additional scattered hypoechoic lesions throughout both lobes all less than 0.5 cm diameter.   IMPRESSION: 1. Stable mild thyromegaly with scattered subcentimeter nodules. None meet criteria for biopsy or dedicated imaging follow-up.  Reviewed latest TSH: Lab Results  Component Value Date   TSH 1.95 10/27/2019   TSH 2.710 02/10/2018   TSH 3.820 04/01/2016   Pt denies: - feeling nodules in neck -  hoarseness - dysphagia - choking  No FH of thyroid  cancer. No h/o radiation tx to head or neck. +FH of thyroid  ds in sister, GM  He also has BPH with nocturia.  ROS: + See HPI  I reviewed pt's medications, allergies, PMH, social hx, family hx, and changes were documented in the history of present illness. Otherwise, unchanged from my initial visit note.  Past Medical History:  Diagnosis Date   Arthritis    Asthma    as child   Complication of anesthesia    hard to waken   Diabetes (HCC)    type II   Diverticulosis    Dysrhythmia    PAC's   Goiter    History of hiatal hernia    Hyperlipidemia    Hypertension  Kidney stones    Stress fracture    left knee, right hip   Thyroid  disease    Past Surgical History:  Procedure Laterality Date   CYSTOSCOPY WITH INSERTION OF UROLIFT     JOINT REPLACEMENT     KIDNEY STONES  2006   SKIN CANCER DESTRUCTION Left    deltoid- [facility]   SKIN CANCER EXCISION Right 09/16/2018   shoulder    TONSILLECTOMY     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   Procedure: RIGHT TOTAL HIP ARTHROPLASTY ANTERIOR APPROACH;  Surgeon: Dempsey Moan, MD;  Location: [facility];  Service: Orthopedics;  Laterality: Right;   WISDOM TOOTH EXTRACTION     Social History   Socioeconomic History   Marital status: Married    Spouse name: Not on file   Number of children: 3   Years of education: Not on file   Highest education level: Not on file  Occupational History    Employer:  Funeral director's assistant  Tobacco Use   Smoking status: Former Smoker    Quit date: 11/02/1974    Years since quitting: 45.0   Smokeless tobacco: Never Used  Substance and Sexual Activity   Alcohol use: Yes    Alcohol/week: 1.0 - 2.0 standard drinks    Types: 1 - 2 Standard drinks or equivalent per week    Comment: 2-3 PER WEEK   Drug use: no    Types:    Sexual activity: Not on file  Other Topics Concern   Not on file  Social History Narrative   Lives at home with wife.      Social Determinants of Health   Financial Resource Strain:    Difficulty of Paying Living Expenses: Not on file  Food Insecurity:    Worried About Programme Researcher, Broadcasting/film/video in the Last Year: Not on file   [employer] of Food in the Last Year: Not on file  Transportation Needs:    Lack of Transportation (Medical): Not on file   Lack of Transportation (Non-Medical): Not on file  Physical Activity:    Days of Exercise per Week: Not on file   Minutes of Exercise per Session: Not on file  Stress:    Feeling of Stress : Not on file  Social Connections:    Frequency of Communication with Friends and Family: Not on file   Frequency of Social Gatherings with Friends and Family: Not on file   Attends Religious Services: Not on file   Active Member of Clubs or Organizations: Not on file   Attends Banker Meetings: Not on file   Marital Status: Not on file  Intimate Partner Violence:    Fear of Current or Ex-Partner: Not on file   Emotionally Abused: Not on file   Physically Abused: Not on file   Sexually Abused: Not on file   Current Outpatient Medications on File Prior to Visit  Medication Sig Dispense Refill   Accu-Chek FastClix Lancets MISC Use to check blood sugar three times daily 102 each 3   aspirin 81 MG tablet Take by mouth daily.      Blood Glucose Monitoring Suppl (ACCU-CHEK GUIDE ME) w/Device KIT Use to check blood sugar three times daily 1 kit 0   Cholecalciferol (VITAMIN D ) 2000 units CAPS Take 1 capsule by mouth daily.     Continuous Blood Gluc Sensor (FREESTYLE LIBRE 3 SENSOR) MISC 1 each by Does not apply route every 14 (fourteen) days. 6 each 3   FARXIGA  10  MG TABS tablet TAKE 1 TABLET BY MOUTH EVERY DAY 90 tablet 3   glipiZIDE  (GLUCOTROL ) 5 MG tablet Take 1 tablet (5 mg total) by mouth daily before supper. 30 tablet 0   glucose blood (ACCU-CHEK GUIDE TEST) test strip Use to check blood sugar three times daily 100 each 12   glucose blood (ONETOUCH VERIO) test  strip Use to check blood sugar 3 times a day 300 strip 12   ibuprofen (ADVIL) 200 MG tablet Take 600 mg by mouth every 6 (six) hours as needed.     metFORMIN  (GLUCOPHAGE -XR) 500 MG 24 hr tablet TAKE 2 TABLETS (1,000 MG TOTAL) BY MOUTH 2 (TWO) TIMES DAILY WITH A MEAL. 360 tablet 3   olmesartan  (BENICAR ) 20 MG tablet TAKE 1/2 TABLET DAILY. PLEASE KEEP UPCOMING APPOINTMENT IN JANUARY 2025 FOR FUTURE REFILLS 45 tablet 3   omega-3 acid ethyl esters (LOVAZA ) 1 g capsule Take 2 capsules (2 g total) by mouth 2 (two) times daily. 360 capsule 3   OneTouch Delica Lancets 33G MISC Check 1x a day 100 each 3   rosuvastatin  (CRESTOR ) 40 MG tablet Take 1 tablet (40 mg total) by mouth daily. 90 tablet 3   RYBELSUS  14 MG TABS TAKE 1.5 TABLET (21MG ) BY MOUTH ONCE DAILY 135 tablet 4   tadalafil  (CIALIS ) 10 MG tablet TAKE 1 TABLET (10 MG TOTAL) BY MOUTH EVERY OTHER DAY AS NEEDED FOR ERECTILE DYSFUNCTION. 8 tablet 2   Vitamins/Minerals TABS Take by mouth.     No current facility-administered medications on file prior to visit.   Allergies  Allergen Reactions   Codeine     REACTION: agitation   Erythromycin     REACTION: diarrhea   Latex     rash   Other     Melon- itchy throat   Family History  Problem Relation Age of Onset   Breast cancer Mother 63   Hypertension Mother    Cancer Mother    CAD Father 36       Died age 6 after CABG   Stroke Father    Hypertension Father    AAA (abdominal aortic aneurysm) Father    Hyperlipidemia Father    Heart disease Father    Cancer Sister        ovarian   Diabetes Sister    Thyroid  disease Sister    Colon polyps Neg Hx    Colon cancer Neg Hx    Esophageal cancer Neg Hx    Stomach cancer Neg Hx    Rectal cancer Neg Hx    PE: BP 112/60   Pulse 92   Ht 6' (1.829 m)   Wt 196 lb 9.6 oz (89.2 kg)   SpO2 96%   BMI 26.66 kg/m  Wt Readings from Last 3 Encounters:  08/10/24 196 lb 9.6 oz (89.2 kg)  05/24/24 195 lb 6.4 oz (88.6 kg)  01/25/24 196 lb 3.2  oz (89 kg)   Constitutional: overweight, in NAD Eyes:  EOMI, no exophthalmos ENT: no neck masses, no cervical lymphadenopathy Cardiovascular: RRR, No MRG Respiratory: CTA B Musculoskeletal: no deformities Skin:no rashes Neurological: no tremor with outstretched hands Diabetic Foot Exam - Simple   Simple Foot Form Diabetic Foot exam was performed with the following findings: Yes 08/10/2024 11:42 AM  Visual Inspection No deformities, no ulcerations, no other skin breakdown bilaterally: Yes Sensation Testing Intact to touch and monofilament testing bilaterally: Yes Pulse Check Posterior Tibialis and Dorsalis pulse intact bilaterally: Yes Comments + Dry skin +  onychodystrophy in all toenails    ASSESSMENT: 1. DM2, non-insulin -dependent, uncontrolled, with complications - CAD - CKD  Component     Latest Ref Rng & Units 01/14/2021  Glucose     65 - 99 mg/dL 877 (H)  Hemoglobin J8R     4.0 - 5.6 % 7.0 (A)  C-Peptide     0.80 - 3.85 ng/mL 2.54  Islet Cell Ab     Neg:<1:1 Negative  ZNT8 Antibodies     <15 U/mL <10  Glutamic Acid Decarb Ab     <5 IU/mL <5  No signs of insulin  deficiency or pancreatic autoimmunity.  2. HL  3. MNG  PLAN:  1. Patient with longstanding, uncontrolled, type 2 diabetes, on metformin , sulfonylurea, SGLT2 inhibitor therapy and high-dose po GLP-1 receptor agonist, with suboptimal control.  At last visit, HbA1c was 7.3%.  We discussed about improving diet, particularly avoiding pizza and also improving breakfast.  Given examples of a healthier breakfast.  Sugars were mostly slightly above target in the morning and he was not checking consistently later in the day.  At today's visit sugars are still elevated in the morning especially after he moved his dinners later based on his wife's schedule.  Moreover, he is forgetting to take glipizide  before dinner and he mostly takes it after dinner.  I advised him to not take it if he forgets to take it 15 minutes  before the meal, to avoid hypoglycemia overnight.  We also discussed about starting an exercise routine.  I recommended to combine cardio with weight training.  We did not change the regimen otherwise.  We are using a higher dose of Rybelsus  for him and he felt that his sugars improved after increasing the dose. - I again recommended a CGM and addressed his concerns about this.  He would like to think about it. - I suggested to:  Patient Instructions  Please continue: - Metformin  ER 1000 mg 2x a day with meals  - Farxiga  10 mg before b'fast - Glipizide  5 mg before dinner - move it to the table - Rybelsus  21 mg before b'fast  Start exercise.  Please return in 4 months with your sugar log.   - we checked his HbA1c: 7.2% (slightly better) - advised to check sugars at different times of the day - 4x a day, rotating check times - advised for yearly eye exams >> he is UTD - return to clinic in 4 months  2. HL -Latest lipid panel was at goal with the exception of a slightly low HDL: Lab Results  Component Value Date   CHOL 77 (L) 01/05/2024   HDL 30 (L) 01/05/2024   LDLCALC 29 01/05/2024   TRIG 91 01/05/2024   CHOLHDL 2.6 01/05/2024  -He continues on Crestor  40 mg daily and Lovaza  without side effects.  Vascepa  was too expensive.  3. MNG - No neck compression symptoms - Reviewed previous investigation: -This was previously investigated by ultrasound  In 2013 and also FNA in 2019: Benign  -Another ultrasound was obtained in 10/2019 and this showed only 1 nodule measuring slightly less than 1 cm, with the rest of the nodules even smaller - Will need to follow him clinically.  No further imaging necessary for now. - TSH was normal, Lab Results  Component Value Date   TSH 1.95 10/27/2019   Lela Fendt, MD PhD [facility]

## 2024-08-10 NOTE — Patient Instructions (Addendum)
 Please continue: - Metformin  ER 1000 mg 2x a day with meals  - Farxiga  10 mg before b'fast - Glipizide  5 mg before dinner - move it to the table - Rybelsus  21 mg before b'fast  Start exercise.  Please return in 4 months with your sugar log.

## 2024-08-30 ENCOUNTER — Other Ambulatory Visit: Payer: Self-pay | Admitting: Internal Medicine

## 2024-09-06 ENCOUNTER — Encounter: Payer: Self-pay | Admitting: Family Medicine

## 2024-09-06 ENCOUNTER — Ambulatory Visit: Payer: Self-pay | Admitting: Family Medicine

## 2024-09-06 VITALS — BP 144/95 | HR 81 | Ht 72.0 in | Wt 195.0 lb

## 2024-09-06 DIAGNOSIS — I152 Hypertension secondary to endocrine disorders: Secondary | ICD-10-CM

## 2024-09-06 DIAGNOSIS — N529 Male erectile dysfunction, unspecified: Secondary | ICD-10-CM

## 2024-09-06 DIAGNOSIS — E1169 Type 2 diabetes mellitus with other specified complication: Secondary | ICD-10-CM

## 2024-09-06 DIAGNOSIS — N401 Enlarged prostate with lower urinary tract symptoms: Secondary | ICD-10-CM

## 2024-09-06 LAB — CBC WITH DIFFERENTIAL/PLATELET
Basophils Absolute: 0.1 x10E3/uL (ref 0.0–0.2)
Basos: 1 %
EOS (ABSOLUTE): 0.2 x10E3/uL (ref 0.0–0.4)
Eos: 4 %
Hematocrit: 41.4 % (ref 37.5–51.0)
Hemoglobin: 13.8 g/dL (ref 13.0–17.7)
Immature Grans (Abs): 0 x10E3/uL (ref 0.0–0.1)
Immature Granulocytes: 0 %
Lymphocytes Absolute: 1.8 x10E3/uL (ref 0.7–3.1)
Lymphs: 37 %
MCH: 30.6 pg (ref 26.6–33.0)
MCHC: 33.3 g/dL (ref 31.5–35.7)
MCV: 92 fL (ref 79–97)
Monocytes Absolute: 0.5 x10E3/uL (ref 0.1–0.9)
Monocytes: 9 %
Neutrophils Absolute: 2.4 x10E3/uL (ref 1.4–7.0)
Neutrophils: 49 %
Platelets: 210 x10E3/uL (ref 150–450)
RBC: 4.51 x10E6/uL (ref 4.14–5.80)
RDW: 13 % (ref 11.6–15.4)
WBC: 4.9 x10E3/uL (ref 3.4–10.8)

## 2024-09-06 LAB — CMP14+EGFR
ALT: 24 IU/L (ref 0–44)
AST: 25 IU/L (ref 0–40)
Albumin: 4.5 g/dL (ref 3.8–4.8)
Alkaline Phosphatase: 78 IU/L (ref 47–123)
BUN/Creatinine Ratio: 16 (ref 10–24)
BUN: 20 mg/dL (ref 8–27)
Bilirubin Total: 0.8 mg/dL (ref 0.0–1.2)
CO2: 23 mmol/L (ref 20–29)
Calcium: 9.7 mg/dL (ref 8.6–10.2)
Chloride: 101 mmol/L (ref 96–106)
Creatinine, Ser: 1.28 mg/dL — ABNORMAL HIGH (ref 0.76–1.27)
Globulin, Total: 2 g/dL (ref 1.5–4.5)
Glucose: 163 mg/dL — ABNORMAL HIGH (ref 70–99)
Potassium: 4.5 mmol/L (ref 3.5–5.2)
Sodium: 140 mmol/L (ref 134–144)
Total Protein: 6.5 g/dL (ref 6.0–8.5)
eGFR: 60 mL/min/1.73 (ref >59)

## 2024-09-06 LAB — LIPID PANEL
Chol/HDL Ratio: 2.7 ratio (ref 0.0–5.0)
Cholesterol, Total: 86 mg/dL — ABNORMAL LOW (ref 100–199)
HDL: 32 mg/dL — ABNORMAL LOW (ref >39)
LDL Chol Calc (NIH): 33 mg/dL (ref 0–99)
Triglycerides: 111 mg/dL (ref 0–149)
VLDL Cholesterol Cal: 21 mg/dL (ref 5–40)

## 2024-09-06 LAB — BAYER DCA HB A1C WAIVED: HB A1C (BAYER DCA - WAIVED): 7.5 % — ABNORMAL HIGH (ref 4.8–5.6)

## 2024-09-06 LAB — VITAMIN B12: Vitamin B-12: 507 pg/mL (ref 232–1245)

## 2024-09-06 MED ORDER — TADALAFIL 10 MG PO TABS: 10.0000 mg | ORAL_TABLET | Freq: Every day | ORAL | 1 refills | Status: AC

## 2024-09-06 NOTE — Progress Notes (Signed)
 BP (!) 144/95   Pulse 81   Ht 6' (1.829 m)   Wt 195 lb (88.5 kg)   SpO2 98%   BMI 26.45 kg/m    Subjective:   Patient ID: Austin Graham, male    DOB: 01/13/1951, 73 y.o.   MRN: [id number]  HPI: Austin Graham is a 73 y.o. male presenting on 09/06/2024 for Medical Management of Chronic Issues and Diabetes   Discussed the use of AI scribe software for clinical note transcription with the patient, who gave verbal consent to proceed.  History of Present Illness   Austin Graham is a 73 year old male with hypertension and diabetes who presents for a recheck of his blood pressure and blood sugar management.  Glycemic control and dietary challenges - Hemoglobin A1c increased from 7.2% to 7.3% since last endocrinology visit. - Recent difficulty monitoring blood glucose due to running out of test strips after insurance changes, resulting in a period without regular blood sugar checks. - Currently taking Farxiga , glipizide , metformin , and Rybelsus  for diabetes management; not using insulin . - Blood glucose spikes after meals, particularly in the evening and after breakfast. - Breakfast options are challenging; typically eats spoon-sized shredded wheat, which causes postprandial hyperglycemia. - Considering switching breakfast to oatmeal to improve glycemic control. - No episodes of hypoglycemia, lightheadedness, or dizziness.  Nocturia and lower urinary tract symptoms - Nocturia improved, now able to go 3.5 to 4 hours between nocturnal voids. - Taking tadalafil  every other day, which has provided some improvement in nocturia. - Difficulty remembering the every-other-day dosing schedule; interested in trial of daily tadalafil  dosing for further benefit.          Relevant past medical, surgical, family and social history reviewed and updated as indicated. Interim medical history since our last visit reviewed. Allergies and medications reviewed and updated.  Review of  Systems  Constitutional:  Negative for chills and fever.  Eyes:  Negative for visual disturbance.  Respiratory:  Negative for shortness of breath and wheezing.   Cardiovascular:  Negative for chest pain and leg swelling.  Musculoskeletal:  Negative for back pain and gait problem.  Skin:  Negative for rash.  Neurological:  Negative for dizziness and light-headedness.  All other systems reviewed and are negative.   Per HPI unless specifically indicated above   Allergies as of 09/06/2024       Reactions   Codeine    REACTION: agitation   Erythromycin    REACTION: diarrhea   Latex    rash   Other    Melon- itchy throat        Medication List        Accurate as of September 06, 2024 10:55 AM. If you have any questions, ask your nurse or doctor.          Accu-Chek Guide Me w/Device Kit Use to check blood sugar three times daily   aspirin 81 MG tablet Take by mouth daily.   Farxiga  10 MG Tabs tablet Generic drug: dapagliflozin  propanediol TAKE 1 TABLET BY MOUTH EVERY DAY   FreeStyle Libre 3 Sensor Misc 1 each by Does not apply route every 14 (fourteen) days.   glipiZIDE  5 MG tablet Commonly known as: GLUCOTROL  TAKE 1 TABLET (5 MG TOTAL) BY MOUTH DAILY BEFORE SUPPER.   ibuprofen 200 MG tablet Commonly known as: ADVIL Take 600 mg by mouth every 6 (six) hours as needed.   metFORMIN  500 MG 24 hr tablet Commonly known as: GLUCOPHAGE -XR TAKE 2  TABLETS (1,000 MG TOTAL) BY MOUTH 2 (TWO) TIMES DAILY WITH A MEAL.   olmesartan  20 MG tablet Commonly known as: BENICAR  TAKE 1/2 TABLET DAILY. PLEASE KEEP UPCOMING APPOINTMENT IN JANUARY 2025 FOR FUTURE REFILLS   omega-3 acid ethyl esters 1 g capsule Commonly known as: LOVAZA  Take 2 capsules (2 g total) by mouth 2 (two) times daily.   OneTouch Delica Lancets 33G Misc Check 1x a day   Accu-Chek FastClix Lancets Misc Use to check blood sugar three times daily   OneTouch Verio test strip Generic drug: glucose  blood Use to check blood sugar 3 times a day   Accu-Chek Guide Test test strip Generic drug: glucose blood Use to check blood sugar three times daily   rosuvastatin  40 MG tablet Commonly known as: CRESTOR  Take 1 tablet (40 mg total) by mouth daily.   Rybelsus  14 MG Tabs Generic drug: Semaglutide  TAKE 1.5 TABLET (21MG ) BY MOUTH ONCE DAILY   tadalafil  10 MG tablet Commonly known as: CIALIS  Take 1 tablet (10 mg total) by mouth daily. What changed: See the new instructions. Changed by: Fonda LABOR Larna Capelle   Vitamin D  50 MCG (2000 UT) Caps Take 1 capsule by mouth daily.   Vitamins/Minerals Tabs Take by mouth.         Objective:   BP (!) 144/95   Pulse 81   Ht 6' (1.829 m)   Wt 195 lb (88.5 kg)   SpO2 98%   BMI 26.45 kg/m   Wt Readings from Last 3 Encounters:  09/06/24 195 lb (88.5 kg)  08/10/24 196 lb 9.6 oz (89.2 kg)  05/24/24 195 lb 6.4 oz (88.6 kg)    Physical Exam Vitals and nursing note reviewed.  Constitutional:      General: He is not in acute distress.    Appearance: He is well-developed. He is not diaphoretic.  Eyes:     General: No scleral icterus.    Conjunctiva/sclera: Conjunctivae normal.  Neck:     Thyroid : No thyromegaly.  Cardiovascular:     Rate and Rhythm: Normal rate and regular rhythm.     Heart sounds: Normal heart sounds. No murmur heard. Pulmonary:     Effort: Pulmonary effort is normal. No respiratory distress.     Breath sounds: Normal breath sounds. No wheezing.  Musculoskeletal:        General: No swelling. Normal range of motion.     Cervical back: Neck supple.  Lymphadenopathy:     Cervical: No cervical adenopathy.  Skin:    General: Skin is warm and dry.     Findings: No rash.  Neurological:     Mental Status: He is alert and oriented to person, place, and time.     Coordination: Coordination normal.  Psychiatric:        Behavior: Behavior normal.    Physical Exam   VITALS: BP- 116/70 NECK: Thyroid  without nodules  or enlargement. CHEST: Lungs clear to auscultation bilaterally. CARDIOVASCULAR: Regular rate and rhythm, no murmurs. Peripheral pulses intact. ABDOMEN: No renal enlargement or tenderness. EXTREMITIES: No edema in lower extremities.         Assessment & Plan:   Problem List Items Addressed This Visit       Cardiovascular and Mediastinum   Hypertension associated with diabetes (HCC) - Primary   Relevant Medications   tadalafil  (CIALIS ) 10 MG tablet   Other Relevant Orders   CBC with Differential/Platelet   CMP14+EGFR   Lipid panel   Vitamin B12   Bayer  DCA Hb A1c Waived     Endocrine   Diabetes mellitus (HCC)   Hyperlipidemia associated with type 2 diabetes mellitus (HCC)   Relevant Medications   tadalafil  (CIALIS ) 10 MG tablet   Other Relevant Orders   CBC with Differential/Platelet   CMP14+EGFR   Lipid panel   Vitamin B12   Bayer DCA Hb A1c Waived     Genitourinary   BPH (benign prostatic hyperplasia)   Relevant Medications   tadalafil  (CIALIS ) 10 MG tablet   Other Visit Diagnoses       Male erectile dysfunction, unspecified       Relevant Medications   tadalafil  (CIALIS ) 10 MG tablet         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 Blood sugar spikes postprandially, particularly after breakfast. Current medications include Farxiga , glipizide , metformin , and Rybelsus . No insulin  use. Endocrinologist advised dietary modifications. - Continue Farxiga , glipizide , metformin , and Rybelsus . - Encouraged dietary modifications to reduce postprandial blood sugar spikes, such as switching to eggs or unflavored oatmeal with added protein like peanut butter. - Consider using a continuous glucose monitor for better glucose pattern tracking.  Erectile dysfunction Currently managed with tadalafil  (Cialis ) every other day. Reports improvement but desires  increased frequency of dosing. Concerns about potential hypotension with daily dosing. Blood pressure has been well-managed. - Increased tadalafil  to daily dosing. - Instructed to monitor blood pressure closely, especially for symptoms of hypotension such as lightheadedness or dizziness. - Instructed to check blood pressure daily for the first 30 days of increased dosing.          Follow up plan: Return in about 3 months (around 12/05/2024), or if symptoms worsen or fail to improve, for Diabetes recheck.  Counseling provided for all of the vaccine components Orders Placed This Encounter  Procedures   CBC with Differential/Platelet   CMP14+EGFR   Lipid panel   Vitamin B12   Bayer DCA Hb A1c Waived    Fonda Levins, MD [facility] 09/06/2024, 10:55 AM

## 2024-09-13 ENCOUNTER — Ambulatory Visit: Payer: Self-pay | Admitting: Family Medicine

## 2024-10-16 ENCOUNTER — Other Ambulatory Visit: Payer: Self-pay | Admitting: Family Medicine

## 2024-12-13 ENCOUNTER — Ambulatory Visit: Admitting: Family Medicine

## 2024-12-21 ENCOUNTER — Ambulatory Visit: Admitting: Internal Medicine
